# Patient Record
Sex: Female | Born: 1945
Health system: Southern US, Community
[De-identification: ages and names within clinical notes are randomized; demographics above are authoritative.]

## PROBLEM LIST (undated history)

## (undated) DIAGNOSIS — I5032 Chronic diastolic (congestive) heart failure: Secondary | ICD-10-CM

## (undated) DIAGNOSIS — I10 Essential (primary) hypertension: Secondary | ICD-10-CM

## (undated) DIAGNOSIS — F419 Anxiety disorder, unspecified: Secondary | ICD-10-CM

## (undated) DIAGNOSIS — G959 Disease of spinal cord, unspecified: Secondary | ICD-10-CM

## (undated) DIAGNOSIS — I350 Nonrheumatic aortic (valve) stenosis: Secondary | ICD-10-CM

## (undated) DIAGNOSIS — H409 Unspecified glaucoma: Secondary | ICD-10-CM

## (undated) DIAGNOSIS — N183 Chronic kidney disease, stage 3 unspecified: Secondary | ICD-10-CM

## (undated) DIAGNOSIS — Z952 Presence of prosthetic heart valve: Secondary | ICD-10-CM

## (undated) DIAGNOSIS — I00 Rheumatic fever without heart involvement: Secondary | ICD-10-CM

## (undated) DIAGNOSIS — E785 Hyperlipidemia, unspecified: Secondary | ICD-10-CM

## (undated) DIAGNOSIS — E119 Type 2 diabetes mellitus without complications: Secondary | ICD-10-CM

## (undated) HISTORY — PX: APPENDECTOMY: SHX54

## (undated) HISTORY — PX: TONSILLECTOMY: SUR1361

## (undated) HISTORY — PX: TUBAL LIGATION: SHX77

## (undated) HISTORY — DX: Chronic kidney disease, stage 3 unspecified: N18.30

## (undated) HISTORY — DX: Unspecified glaucoma: H40.9

## (undated) HISTORY — DX: Disease of spinal cord, unspecified: G95.9

## (undated) HISTORY — DX: Hyperlipidemia, unspecified: E78.5

## (undated) HISTORY — PX: VAGINAL HYSTERECTOMY: SUR661

## (undated) HISTORY — DX: Chronic kidney disease, stage 3 (moderate): N18.3

## (undated) HISTORY — DX: Essential (primary) hypertension: I10

## (undated) HISTORY — DX: Chronic diastolic (congestive) heart failure: I50.32

## (undated) HISTORY — DX: Type 2 diabetes mellitus without complications: E11.9

## (undated) HISTORY — PX: CARDIAC CATHETERIZATION: SHX172

## (undated) HISTORY — PX: NECK SURGERY: SHX720

## (undated) HISTORY — PX: EYE SURGERY: SHX253

## (undated) HISTORY — DX: Rheumatic fever without heart involvement: I00

---

## 1999-04-25 ENCOUNTER — Ambulatory Visit (HOSPITAL_BASED_OUTPATIENT_CLINIC_OR_DEPARTMENT_OTHER): Admission: RE | Admit: 1999-04-25 | Discharge: 1999-04-25 | Payer: Self-pay | Admitting: General Surgery

## 2001-11-11 ENCOUNTER — Emergency Department (HOSPITAL_COMMUNITY): Admission: EM | Admit: 2001-11-11 | Discharge: 2001-11-11 | Payer: Self-pay | Admitting: Emergency Medicine

## 2003-02-16 ENCOUNTER — Encounter: Admission: RE | Admit: 2003-02-16 | Discharge: 2003-05-17 | Payer: Self-pay | Admitting: *Deleted

## 2003-05-19 ENCOUNTER — Encounter: Admission: RE | Admit: 2003-05-19 | Discharge: 2003-08-17 | Payer: Self-pay | Admitting: *Deleted

## 2003-08-17 ENCOUNTER — Encounter: Admission: RE | Admit: 2003-08-17 | Discharge: 2003-11-15 | Payer: Self-pay | Admitting: *Deleted

## 2004-06-20 ENCOUNTER — Encounter: Admission: RE | Admit: 2004-06-20 | Discharge: 2004-06-20 | Payer: Self-pay | Admitting: *Deleted

## 2005-08-31 ENCOUNTER — Encounter: Admission: RE | Admit: 2005-08-31 | Discharge: 2005-08-31 | Payer: Self-pay | Admitting: *Deleted

## 2006-05-27 ENCOUNTER — Encounter: Admission: RE | Admit: 2006-05-27 | Discharge: 2006-08-25 | Payer: Self-pay | Admitting: *Deleted

## 2006-09-05 ENCOUNTER — Encounter: Admission: RE | Admit: 2006-09-05 | Discharge: 2006-09-05 | Payer: Self-pay | Admitting: *Deleted

## 2009-10-25 ENCOUNTER — Emergency Department (HOSPITAL_COMMUNITY): Admission: EM | Admit: 2009-10-25 | Discharge: 2009-10-25 | Payer: Self-pay | Admitting: Family Medicine

## 2010-09-20 ENCOUNTER — Encounter
Admission: RE | Admit: 2010-09-20 | Discharge: 2010-10-05 | Payer: Self-pay | Source: Home / Self Care | Attending: Neurosurgery | Admitting: Neurosurgery

## 2010-10-12 ENCOUNTER — Encounter
Admission: RE | Admit: 2010-10-12 | Discharge: 2010-11-06 | Payer: Self-pay | Source: Home / Self Care | Attending: Specialist | Admitting: Specialist

## 2010-11-27 ENCOUNTER — Ambulatory Visit: Payer: Self-pay | Attending: Neurosurgery | Admitting: Physical Therapy

## 2010-11-27 DIAGNOSIS — M25519 Pain in unspecified shoulder: Secondary | ICD-10-CM | POA: Insufficient documentation

## 2010-11-27 DIAGNOSIS — M542 Cervicalgia: Secondary | ICD-10-CM | POA: Insufficient documentation

## 2010-11-27 DIAGNOSIS — IMO0001 Reserved for inherently not codable concepts without codable children: Secondary | ICD-10-CM | POA: Insufficient documentation

## 2010-11-27 DIAGNOSIS — M2569 Stiffness of other specified joint, not elsewhere classified: Secondary | ICD-10-CM | POA: Insufficient documentation

## 2010-11-30 ENCOUNTER — Ambulatory Visit: Payer: Self-pay | Admitting: Rehabilitation

## 2010-12-05 ENCOUNTER — Ambulatory Visit: Payer: Self-pay | Admitting: Physical Therapy

## 2010-12-07 ENCOUNTER — Ambulatory Visit: Payer: Self-pay | Attending: Neurosurgery | Admitting: Physical Therapy

## 2010-12-07 DIAGNOSIS — IMO0001 Reserved for inherently not codable concepts without codable children: Secondary | ICD-10-CM | POA: Insufficient documentation

## 2010-12-07 DIAGNOSIS — M542 Cervicalgia: Secondary | ICD-10-CM | POA: Insufficient documentation

## 2010-12-07 DIAGNOSIS — M2569 Stiffness of other specified joint, not elsewhere classified: Secondary | ICD-10-CM | POA: Insufficient documentation

## 2010-12-07 DIAGNOSIS — M25519 Pain in unspecified shoulder: Secondary | ICD-10-CM | POA: Insufficient documentation

## 2010-12-12 ENCOUNTER — Ambulatory Visit: Payer: Self-pay | Admitting: Physical Therapy

## 2010-12-14 ENCOUNTER — Ambulatory Visit: Payer: Self-pay | Admitting: Physical Therapy

## 2010-12-19 ENCOUNTER — Ambulatory Visit: Payer: Self-pay | Admitting: Physical Therapy

## 2010-12-21 ENCOUNTER — Ambulatory Visit: Payer: Self-pay | Admitting: Physical Therapy

## 2010-12-26 ENCOUNTER — Ambulatory Visit: Payer: Self-pay | Admitting: Physical Therapy

## 2010-12-28 ENCOUNTER — Ambulatory Visit: Payer: Self-pay | Admitting: Physical Therapy

## 2011-01-02 ENCOUNTER — Ambulatory Visit: Payer: Self-pay | Admitting: Physical Therapy

## 2011-01-04 ENCOUNTER — Ambulatory Visit: Payer: Self-pay | Admitting: Physical Therapy

## 2011-01-09 ENCOUNTER — Ambulatory Visit: Payer: Self-pay | Attending: Neurosurgery | Admitting: Physical Therapy

## 2011-01-09 DIAGNOSIS — M2569 Stiffness of other specified joint, not elsewhere classified: Secondary | ICD-10-CM | POA: Insufficient documentation

## 2011-01-09 DIAGNOSIS — IMO0001 Reserved for inherently not codable concepts without codable children: Secondary | ICD-10-CM | POA: Insufficient documentation

## 2011-01-09 DIAGNOSIS — M25519 Pain in unspecified shoulder: Secondary | ICD-10-CM | POA: Insufficient documentation

## 2011-01-09 DIAGNOSIS — M542 Cervicalgia: Secondary | ICD-10-CM | POA: Insufficient documentation

## 2011-01-11 ENCOUNTER — Ambulatory Visit: Payer: Self-pay | Admitting: Physical Therapy

## 2011-01-16 ENCOUNTER — Ambulatory Visit: Payer: Self-pay | Admitting: Physical Therapy

## 2011-01-18 ENCOUNTER — Ambulatory Visit: Payer: Self-pay | Admitting: Physical Therapy

## 2011-01-23 ENCOUNTER — Ambulatory Visit: Payer: Self-pay | Admitting: Physical Therapy

## 2011-01-25 ENCOUNTER — Ambulatory Visit: Payer: Self-pay | Admitting: Physical Therapy

## 2011-01-30 ENCOUNTER — Ambulatory Visit: Payer: Self-pay | Admitting: Physical Therapy

## 2011-02-01 ENCOUNTER — Ambulatory Visit: Payer: Self-pay | Admitting: Physical Therapy

## 2011-02-06 ENCOUNTER — Ambulatory Visit: Payer: Self-pay | Attending: Neurosurgery | Admitting: Physical Therapy

## 2011-02-06 DIAGNOSIS — IMO0001 Reserved for inherently not codable concepts without codable children: Secondary | ICD-10-CM | POA: Insufficient documentation

## 2011-02-06 DIAGNOSIS — M542 Cervicalgia: Secondary | ICD-10-CM | POA: Insufficient documentation

## 2011-02-06 DIAGNOSIS — M2569 Stiffness of other specified joint, not elsewhere classified: Secondary | ICD-10-CM | POA: Insufficient documentation

## 2011-02-06 DIAGNOSIS — M25519 Pain in unspecified shoulder: Secondary | ICD-10-CM | POA: Insufficient documentation

## 2011-02-08 ENCOUNTER — Ambulatory Visit: Payer: Self-pay | Admitting: Physical Therapy

## 2011-02-13 ENCOUNTER — Ambulatory Visit: Payer: Self-pay | Admitting: Physical Therapy

## 2011-02-20 ENCOUNTER — Encounter: Payer: Self-pay | Admitting: Physical Therapy

## 2011-02-20 ENCOUNTER — Ambulatory Visit: Payer: Self-pay | Admitting: Physical Therapy

## 2011-02-22 ENCOUNTER — Encounter: Payer: Self-pay | Admitting: Physical Therapy

## 2012-08-06 ENCOUNTER — Other Ambulatory Visit: Payer: Self-pay | Admitting: Family Medicine

## 2012-08-06 DIAGNOSIS — Z1231 Encounter for screening mammogram for malignant neoplasm of breast: Secondary | ICD-10-CM

## 2012-10-16 DIAGNOSIS — Z981 Arthrodesis status: Secondary | ICD-10-CM | POA: Insufficient documentation

## 2012-10-17 ENCOUNTER — Ambulatory Visit
Admission: RE | Admit: 2012-10-17 | Discharge: 2012-10-17 | Disposition: A | Discharge status: Home / Self Care | Payer: Medicare Other | Source: Ambulatory Visit | Attending: Family Medicine | Admitting: Family Medicine

## 2012-10-17 DIAGNOSIS — Z1231 Encounter for screening mammogram for malignant neoplasm of breast: Secondary | ICD-10-CM

## 2014-03-05 LAB — BASIC METABOLIC PANEL WITH GFR: Creatinine: 1.5 mg/dL — AB (ref <=1.1)

## 2014-03-05 LAB — LIPID PANEL: LDL Cholesterol: 69 mg/dL

## 2014-03-05 LAB — HEMOGLOBIN A1C: Hgb A1c MFr Bld: 8 % — AB (ref 4.0–6.0)

## 2014-04-30 ENCOUNTER — Encounter: Payer: Self-pay | Admitting: *Deleted

## 2014-05-27 ENCOUNTER — Ambulatory Visit (INDEPENDENT_AMBULATORY_CARE_PROVIDER_SITE_OTHER): Payer: Commercial Managed Care - HMO | Admitting: Endocrinology

## 2014-05-27 ENCOUNTER — Encounter: Payer: Self-pay | Admitting: Endocrinology

## 2014-05-27 VITALS — BP 110/70 | HR 70 | Temp 98.3°F | Resp 16 | Ht 60.0 in | Wt 208.4 lb

## 2014-05-27 DIAGNOSIS — E1149 Type 2 diabetes mellitus with other diabetic neurological complication: Secondary | ICD-10-CM

## 2014-05-27 DIAGNOSIS — I10 Essential (primary) hypertension: Secondary | ICD-10-CM

## 2014-05-27 DIAGNOSIS — N183 Chronic kidney disease, stage 3 unspecified: Secondary | ICD-10-CM

## 2014-05-27 DIAGNOSIS — E785 Hyperlipidemia, unspecified: Secondary | ICD-10-CM

## 2014-05-27 MED ORDER — METFORMIN HCL 500 MG PO TABS: 500.0000 mg | ORAL_TABLET | Freq: Every day | ORAL | Status: DC

## 2014-05-27 NOTE — Patient Instructions (Signed)
Metformin at bedtime  Please check blood sugars at least half the time about 2 hours after any meal and times 3-4  per week on waking up. Please bring blood sugar monitor to each visit  Exercise bike daily

## 2014-05-27 NOTE — Progress Notes (Signed)
Patient ID: Shannon Kidd, female   DOB: 17-Feb-1946, 68 y.o.   MRN: [id number]           Reason for Appointment: Consultation for Type 2 Diabetes  Referring physician: Leighton Ruff  History of Present Illness:          Diagnosis: Type 2 diabetes mellitus, date of diagnosis: 2000        Past history: She used Metformin for her diabetes for several years until early 2015 and was taking 1g bid before it was stopped. She thinks her blood sugars are well controlled with this. Also at some point she was given Actos but this was stopped because of edema. Metformin was stopped in early 2015 on the suggestion of a nephrologist because of her renal dysfunction  Recent history:  Since early 2015 she has been taking Januvia 50 mg daily However her blood sugars have not been well controlled with A1c going up to 8% in 5/15 She has been checking her sugar fairly consistently recently with a generic monitor and they seem to be variable. Sugars are mostly higher in the mornings and recently about 170, usually better later in the day       Oral hypoglycemic drugs the patient is taking are: Januvia '50mg'$       Side effects from medications have been: None Compliance with the medical regimen: Good  Hypoglycemia: None    Glucose monitoring:  done one-2 times a day         Glucometer: True Result      Blood Glucose readings by time of day and averages from meter record  PREMEAL Breakfast Lunch Dinner Bedtime  Overall   Glucose range: 141--214 118-224 133-156 144-174   Median:     ?    Glycemic control: 7.2 in 10/14   Lab Results  Component Value Date   HGBA1C 8.0* 03/05/2014   Lab Results  Component Value Date   LDLCALC 69 03/05/2014   CREATININE 1.5* 03/05/2014    Retinal exam: Most recent:.   Self-care: The diet that the patient has been following is: tries to limit .      Meals: 3 meals per day. Is eating out daily, Breakfast is bagel or biscuits           Exercise: Bike 15 min, 3/7          Dietician visit: Most recent:2000.               Weight history: 195-245  Wt Readings from Last 3 Encounters:  05/27/14 208 lb 6.4 oz (94.53 kg)       Medication List       This list is accurate as of: 05/27/14  4:13 PM.  Always use your most recent med list.               aspirin 81 MG tablet  Take 81 mg by mouth daily.     lisinopril-hydrochlorothiazide 20-12.5 MG per tablet  Commonly known as:  PRINZIDE,ZESTORETIC  Take 0.5 tablets by mouth daily.     lovastatin 20 MG tablet  Commonly known as:  MEVACOR  Take 20 mg by mouth daily.     MULTIVITAMIN PO  Take 1 tablet by mouth daily.     pramipexole 0.25 MG tablet  Commonly known as:  MIRAPEX  Take 0.25 mg by mouth daily.     sitaGLIPtin 50 MG tablet  Commonly known as:  JANUVIA  Take 50 mg by mouth daily.  vitamin C 1000 MG tablet  Take 1,000 mg by mouth daily.     Vitamin D 2000 UNITS tablet  Take 2,000 Units by mouth daily.        Allergies:  Allergies  Allergen Reactions  . Actos [Pioglitazone]     Swelling   . Metformin And Related     Kidney issues     Past Medical History  Diagnosis Date  . HTN (hypertension)   . Diabetes mellitus   . Hyperlipemia   . Glaucoma   . Anemia   . Leg weakness   . Cervical myelopathy     s/p cervical fusion @ [facility]  . CKD (chronic kidney disease), stage III   . Decreased pedal pulses     normal ABI 02/03/13    No past surgical history on file.  No family history on file.  Social History:  reports that she has never smoked. She has never used smokeless tobacco. Her alcohol and drug histories are not on file.    Review of Systems       Lipids: She has been treated with lovastatin low-dose       Lab Results  Component Value Date   LDLCALC 69 03/05/2014                  Skin: No rash or infections     Thyroid:  No  unusual fatigue.     The blood pressure has been well controlled with lisinopril HCT, taking half tablet     No  swelling of feet.     No shortness of breath or chest tightness  on exertion.     Bowel habits: Normal.       No frequency of urination or dysuria      No joint  pains.          No history of Numbness, has tingling mostly in left along with some restless legs at night; no burning in feet      LABS:  Office Visit on 05/27/2014  Component Date Value Ref Range Status  . Creatinine 03/05/2014 1.5* .5 - 1.1 mg/dL Final  . LDL Cholesterol 03/05/2014 69   Final  . Hemoglobin A1C 03/05/2014 8.0* 4.0 - 6.0 % Final    Physical Examination:  BP 110/70  Pulse 70  Temp(Src) 98.3 F (36.8 C)  Resp 16  Ht 5' (1.524 m)  Wt 208 lb 6.4 oz (94.53 kg)  BMI 40.70 kg/m2  SpO2 96%  GENERAL:         Patient has marked generalized obesity.   HEENT:         Eye exam shows normal external appearance. Fundus exam shows no retinopathy.  Oral exam shows normal mucosa .  NECK:         General:  Neck exam shows no lymphadenopathy. Carotid exam shows no Thyroid is not enlarged and no nodules felt.   LUNGS:         Chest is symmetrical. Lungs are clear to auscultation.Marland Kitchen   HEART:         Heart sounds:  S1 and S2 are normal. No murmurs or clicks heard., no S3 or S4.   ABDOMEN:   There is no distention present. Liver and spleen are not palpable. No other mass or tenderness present.  EXTREMITIES:     There is no edema. No skin lesions present.Marland Kitchen  NEUROLOGICAL:   Vibration sense is  mildly  reduced in toes. Ankle jerks are  absent bilaterally.          Diabetic foot exam:  Diabetic foot exam shows normal monofilament sensation in the toes and plantar surfaces, no skin lesions or ulcers on the feet and normal pedal pulses MUSCULOSKELETAL:       There is no enlargement or deformity of the joints. Spine is normal to inspection.Marland Kitchen   SKIN:       No rash or lesions        ASSESSMENT:  Diabetes type 2, uncontrolled     She is currently taking Januvia alone without adequate control Her highest blood sugars appear  to be fasting, likely to be related to hepatic gluconeogenesis She apparently was doing much better with metformin but unable to take the previously high dose of 2000 mg because of renal dysfunction. Review of her meal planning indicates that she needs significant improvement with cutting back on high fat foods, fast foods and portions. Also need significant amount of diabetes education She is currently using a glucose monitor that is generic and not clear if it is reliable Discussed with patient that since metformin is an appropriate medication for her she can be given a small dose since her creatinine is only 1.48. Recent studies have shown that metformin is safe with creatinine clearance as low as 35  Complications: none   Hypertension: Well controlled  Hypercholesterolemia: LDL at target with Mevacor 20 mg  PLAN:    Start metformin 500 mg at bedtime, may consider using 1000 mg.  Start using Accu-Chek monitor  increase exercise  Consultation with dietitian  Advised her to reduce fast food and high fat intake  May also consider using Victoza if blood sugars are not controlled  Patient Instructions  Metformin at bedtime  Please check blood sugars at least half the time about 2 hours after any meal and times 3-4  per week on waking up. Please bring blood sugar monitor to each visit  Exercise bike daily    [facility] 05/27/2014, 4:13 PM   Note: This office note was prepared with Dragon voice recognition system technology. Any transcriptional errors that result from this process are unintentional.

## 2014-05-28 DIAGNOSIS — E1149 Type 2 diabetes mellitus with other diabetic neurological complication: Secondary | ICD-10-CM | POA: Insufficient documentation

## 2014-05-28 DIAGNOSIS — I10 Essential (primary) hypertension: Secondary | ICD-10-CM | POA: Insufficient documentation

## 2014-05-28 DIAGNOSIS — N183 Chronic kidney disease, stage 3 unspecified: Secondary | ICD-10-CM | POA: Insufficient documentation

## 2014-05-28 DIAGNOSIS — E785 Hyperlipidemia, unspecified: Secondary | ICD-10-CM | POA: Insufficient documentation

## 2014-05-31 ENCOUNTER — Encounter: Payer: Medicare HMO | Attending: Endocrinology | Admitting: *Deleted

## 2014-05-31 ENCOUNTER — Encounter: Payer: Self-pay | Admitting: *Deleted

## 2014-05-31 VITALS — Ht 60.0 in | Wt 208.2 lb

## 2014-05-31 DIAGNOSIS — E119 Type 2 diabetes mellitus without complications: Secondary | ICD-10-CM | POA: Diagnosis not present

## 2014-05-31 DIAGNOSIS — Z713 Dietary counseling and surveillance: Secondary | ICD-10-CM | POA: Diagnosis present

## 2014-05-31 DIAGNOSIS — E1149 Type 2 diabetes mellitus with other diabetic neurological complication: Secondary | ICD-10-CM

## 2014-05-31 NOTE — Progress Notes (Signed)
  Medical Nutrition Therapy:  Appt start time: 0930 end time:  1030.   Assessment:  Patient here today for diabetes education. She has had diabetes since 2000, and went to classes at that time. She is here for a refresher education. She can state foods with carbs and knows to limit carbs to 45 grams per meal. She eats out frequently, and is likely going to continue doing so. So, we discussed healthy options eating out. She is also open to preparing foods more at home. She has been on Januvia, which was not adequately controlling BG with A1c of 8.0% and fasting glucose readings over 200. AM BG is generally higher than postprandial. She was restarted on metformin at bedtime, but has only been taking for a few days.   MEDICATIONS: Januvia, Metformin   DIETARY INTAKE:   Usual eating pattern includes 3 meals and 1-2 snacks per day.  24-hr recall:  B ( AM): [employer] gravy or egg biscuit OR cinnamon raisin toast/bagel with butter, sugar-free jam, diet Pepsi Snk ( AM): None  L ( PM): Eats out: [employer] chicken nuggets, salad OR 1/2 sub sandwich (steak and cheese sub), fries diet Pepsi/water Snk ( PM): M&M's occasionally(small portion) D ( PM): Fried fish, OR 1/2 sub sandwich OR pizza OR chili beans, cornbread Snk ( PM): potato chips, peanuts, peanut butter cracker Beverages: Diet Pepsi, water  Usual physical activity: Stationary bike at home 1-2 times per week, 15 minutes, errands, housework  Estimated energy needs: 1200 calories 135 g carbohydrates 75 g protein 40 g fat  Progress Towards Goal(s):  In progress.   Nutritional Diagnosis:  NB-1.1 Food and nutrition-related knowledge deficit As related to diabetes.  As evidenced by last education in 2000.    Intervention:  Nutrition counseling. We discussed basic carb counting, including foods with carbs, label reading, portion size, and meal planning.   Goals:  1. 2-3 carb servings (30-45 grams) per meal, 1 serving (15 grams) per snack 2.  Choose healthy side dishes when eating out (salad, vegetables, baked potato).  3. Choose healthy, low carb snacks.  4. Increase intake of vegetables to at least 3 servings daily.  4. Increase exercise to at least 3 days a week for 15-20 minutes.  Handouts given during visit include:  1200 calorie, 5 day meal plan  Meal plan card  Monitoring/Evaluation:  Dietary intake, exercise, blood glucose, and body weight in 2 month(s).

## 2014-06-18 ENCOUNTER — Other Ambulatory Visit: Payer: Commercial Managed Care - HMO

## 2014-06-23 ENCOUNTER — Other Ambulatory Visit: Payer: Self-pay | Admitting: *Deleted

## 2014-06-23 ENCOUNTER — Telehealth: Payer: Self-pay | Admitting: Endocrinology

## 2014-06-23 MED ORDER — GLUCOSE BLOOD VI STRP: ORAL_STRIP | Status: DC

## 2014-06-23 NOTE — Telephone Encounter (Signed)
Patient would like her test strips for accu check called in   Pharmacy: [facility] [address]    Thank you

## 2014-06-23 NOTE — Telephone Encounter (Signed)
rx sent

## 2014-06-24 ENCOUNTER — Ambulatory Visit: Payer: Commercial Managed Care - HMO | Admitting: Endocrinology

## 2014-06-25 ENCOUNTER — Ambulatory Visit: Payer: Commercial Managed Care - HMO | Admitting: Endocrinology

## 2014-07-15 ENCOUNTER — Other Ambulatory Visit (INDEPENDENT_AMBULATORY_CARE_PROVIDER_SITE_OTHER): Payer: Commercial Managed Care - HMO

## 2014-07-15 DIAGNOSIS — IMO0002 Reserved for concepts with insufficient information to code with codable children: Secondary | ICD-10-CM

## 2014-07-15 DIAGNOSIS — E1165 Type 2 diabetes mellitus with hyperglycemia: Principal | ICD-10-CM

## 2014-07-15 DIAGNOSIS — E1149 Type 2 diabetes mellitus with other diabetic neurological complication: Secondary | ICD-10-CM

## 2014-07-15 DIAGNOSIS — N183 Chronic kidney disease, stage 3 unspecified: Secondary | ICD-10-CM

## 2014-07-15 LAB — RENAL FUNCTION PANEL
Albumin: 3.3 g/dL — ABNORMAL LOW (ref 3.5–5.2)
BUN: 27 mg/dL — ABNORMAL HIGH (ref 6–23)
CO2: 27 meq/L (ref 19–32)
Calcium: 9.3 mg/dL (ref 8.4–10.5)
Chloride: 101 meq/L (ref 96–112)
Creatinine, Ser: 1.6 mg/dL — ABNORMAL HIGH (ref 0.4–1.2)
GFR: 35.33 mL/min — ABNORMAL LOW
Glucose, Bld: 148 mg/dL — ABNORMAL HIGH (ref 70–99)
Phosphorus: 3.8 mg/dL (ref 2.3–4.6)
Potassium: 4.8 meq/L (ref 3.5–5.1)
Sodium: 135 meq/L (ref 135–145)

## 2014-07-15 LAB — HEMOGLOBIN A1C: Hgb A1c MFr Bld: 7.8 % — ABNORMAL HIGH (ref 4.6–6.5)

## 2014-07-20 ENCOUNTER — Ambulatory Visit (INDEPENDENT_AMBULATORY_CARE_PROVIDER_SITE_OTHER): Payer: Commercial Managed Care - HMO | Admitting: Endocrinology

## 2014-07-20 ENCOUNTER — Encounter: Payer: Self-pay | Admitting: Endocrinology

## 2014-07-20 VITALS — BP 118/64 | HR 71 | Temp 98.3°F | Resp 16 | Ht 60.0 in | Wt 206.8 lb

## 2014-07-20 DIAGNOSIS — N183 Chronic kidney disease, stage 3 unspecified: Secondary | ICD-10-CM

## 2014-07-20 DIAGNOSIS — IMO0002 Reserved for concepts with insufficient information to code with codable children: Secondary | ICD-10-CM

## 2014-07-20 DIAGNOSIS — E1165 Type 2 diabetes mellitus with hyperglycemia: Secondary | ICD-10-CM

## 2014-07-20 MED ORDER — GLIPIZIDE ER 5 MG PO TB24: 5.0000 mg | ORAL_TABLET | Freq: Every day | ORAL | Status: DC

## 2014-07-20 NOTE — Progress Notes (Signed)
Patient ID: Shannon Kidd, female   DOB: [birth year], 68 y.o.   MRN: [id number]           Reason for Appointment: Followup for Type 2 Diabetes  Referring physician: Juluis Rainier  History of Present Illness:          Diagnosis: Type 2 diabetes mellitus, date of diagnosis: 2000        Past history: She used Metformin for her diabetes for several years until early 2015 and was taking 1g bid before it was stopped. She thinks her blood sugars are well controlled with this. Also at some point she was given Actos but this was stopped because of edema. Metformin was stopped in early 2015 on the suggestion of a nephrologist because of her renal dysfunction  Recent history:  Since early 2015 she has been taking Januvia 50 mg daily However since her blood sugars were not well controlled with A1c of 8% in 5/15 she was given 500 mg metformin in addition when she was seen here in 05/2014 She appears to have fairly good blood sugars at home although averaging about 150 at various times of the day including fasting However is not checked any readings after dinner despite discussion on the last visit She thinks that she has not been irritable or diet because of husband's illness Also has not been able to find time to exercise Sugars are not as high in the mornings, previously about 170 She is not complaining about the cost of Januvia in the Medicare gap       Oral hypoglycemic drugs the patient is taking are: Januvia 50mg  with 500 mg metformin      Side effects from medications have been: None Compliance with the medical regimen: Good  Hypoglycemia: None    Glucose monitoring:  done <1 times a day         Glucometer:  Accu-Chek     Blood Glucose readings by time of day and averages from meter record  PREMEAL Breakfast Lunch Dinner Bedtime Overall  Glucose range:  143-163   134-156   141-167     Mean/median:  150     150   Glycemic control:  previous 7.2 in 10/14   Lab Results  Component  Value Date   HGBA1C 7.8* 07/15/2014   HGBA1C 8.0* 03/05/2014   Lab Results  Component Value Date   LDLCALC 69 03/05/2014   CREATININE 1.6* 07/15/2014    Self-care:      Meals: 3 meals per day. Is eating out daily, Breakfast is egg, toast       Exercise: less now, was on Bike 15 min, 3/7 days         Dietician visit: Most recent:2000.               Weight history: 195-245 Pounds previously  Wt Readings from Last 3 Encounters:  07/20/14 206 lb 12.8 oz (93.804 kg)  05/31/14 208 lb 3.2 oz (94.439 kg)  05/27/14 208 lb 6.4 oz (94.53 kg)       Medication List       This list is accurate as of: 07/20/14  9:18 PM.  Always use your most recent med list.               aspirin 81 MG tablet  Take 81 mg by mouth daily.     cyanocobalamin 100 MCG tablet  Take 100 mcg by mouth daily.     glipiZIDE 5 MG 24 hr tablet  Commonly known as:  GLUCOTROL XL  Take 1 tablet (5 mg total) by mouth daily with breakfast.     glucose blood test strip  Commonly known as:  ACCU-CHEK AVIVA PLUS  Use as instructed to check blood sugar daily dx code 250.62     lisinopril-hydrochlorothiazide 20-12.5 MG per tablet  Commonly known as:  PRINZIDE,ZESTORETIC  Take 0.5 tablets by mouth daily.     lovastatin 20 MG tablet  Commonly known as:  MEVACOR  Take 20 mg by mouth daily.     metFORMIN 500 MG tablet  Commonly known as:  GLUCOPHAGE  Take 1 tablet (500 mg total) by mouth at bedtime.     MULTIVITAMIN PO  Take 1 tablet by mouth daily.     pramipexole 0.25 MG tablet  Commonly known as:  MIRAPEX  Take 0.25 mg by mouth daily.     vitamin C 1000 MG tablet  Take 1,000 mg by mouth daily.     Vitamin D 2000 UNITS tablet  Take 2,000 Units by mouth daily.        Allergies:  Allergies  Allergen Reactions  . Actos [Pioglitazone]     Swelling     Past Medical History  Diagnosis Date  . HTN (hypertension)   . Diabetes mellitus   . Hyperlipemia   . Glaucoma   . Anemia   . Leg weakness     . Cervical myelopathy     s/p cervical fusion @ [facility]  . CKD (chronic kidney disease), stage III   . Decreased pedal pulses     normal ABI 02/03/13    No past surgical history on file.  No family history on file.  Social History:  reports that she has never smoked. She has never used smokeless tobacco. Her alcohol and drug histories are not on file.    Review of Systems       Lipids: She has been treated with lovastatin low-dose       Lab Results  Component Value Date   LDLCALC 69 03/05/2014                  The blood pressure has been well controlled with lisinopril HCT, taking half tablet         No history of Numbness, has tingling mostly in left along with some restless legs at night; no burning in feet    Chronic kidney disease of unclear etiology, previously creatinine 1.48  LABS:  Appointment on 07/15/2014  Component Date Value Ref Range Status  . Hemoglobin A1C 07/15/2014 7.8* 4.6 - 6.5 % Final   Glycemic Control Guidelines for People with Diabetes:Non Diabetic:  <6%Goal of Therapy: <7%Additional Action Suggested:  >8%   . Sodium 07/15/2014 135  135 - 145 mEq/L Final  . Potassium 07/15/2014 4.8  3.5 - 5.1 mEq/L Final  . Chloride 07/15/2014 101  96 - 112 mEq/L Final  . CO2 07/15/2014 27  19 - 32 mEq/L Final  . Calcium 07/15/2014 9.3  8.4 - 10.5 mg/dL Final  . Albumin 16/10/960410/05/2014 3.3* 3.5 - 5.2 g/dL Final  . BUN 54/09/811910/05/2014 27* 6 - 23 mg/dL Final  . Creatinine, Ser 07/15/2014 1.6* 0.4 - 1.2 mg/dL Final  . Glucose, Bld 14/78/295610/05/2014 148* 70 - 99 mg/dL Final  . Phosphorus 21/30/865710/05/2014 3.8  2.3 - 4.6 mg/dL Final  . GFR 84/69/629510/05/2014 35.33* >60.00 mL/min Final    Physical Examination:  BP 118/64  Pulse 71  Temp(Src) 98.3 F (36.8 C)  Resp 16  Ht 5' (1.524 m)  Wt 206 lb 12.8 oz (93.804 kg)  BMI 40.39 kg/m2  SpO2 97%  Repeat blood pressure standing 128/70  ASSESSMENT:  Diabetes type 2, uncontrolled     She is currently taking Januvia  low dose  metformin Although her A1c is still relatively high at 7.8% her recent blood sugars are not significantly high and fairly consistent from morning to afternoon She is complaining about the cost of Januvia since she is in the Medicare gap  Also not able to increase her metformin ER 500 mg because of borderline GFR of 35: However tolerating this well  She thinks she has not been able to be very compliant with diet and exercise because of family illness  PLAN:     Continue metformin 500 mg at bedtime   Trial  of glipizide ER 5 mg daily instead of Januvia  More blood sugars after dinner  Try to resume exercise and improved diet consistently  Followup with PCP and nephrologist for management of blood pressure, not clear she needs to be on ACE inhibitor especially with rising creatinine  Patient Instructions  Glipzide ER in pm and stop Januvia  More sugars after dinner    [facility] [place] 07/20/2014, 9:18 PM   Note: This office note was prepared with Insurance underwriterDragon voice recognition system technology. Any transcriptional errors that result from this process are unintentional.

## 2014-07-20 NOTE — Patient Instructions (Addendum)
Glipzide ER in pm and stop Januvia  More sugars after dinner

## 2014-08-02 ENCOUNTER — Ambulatory Visit: Payer: Commercial Managed Care - HMO | Admitting: *Deleted

## 2014-08-16 ENCOUNTER — Other Ambulatory Visit: Payer: Self-pay

## 2014-08-16 DIAGNOSIS — Z1231 Encounter for screening mammogram for malignant neoplasm of breast: Secondary | ICD-10-CM

## 2014-08-25 ENCOUNTER — Other Ambulatory Visit: Payer: Medicare HMO

## 2014-08-25 ENCOUNTER — Other Ambulatory Visit (INDEPENDENT_AMBULATORY_CARE_PROVIDER_SITE_OTHER): Payer: Commercial Managed Care - HMO

## 2014-08-25 DIAGNOSIS — N183 Chronic kidney disease, stage 3 unspecified: Secondary | ICD-10-CM

## 2014-08-25 DIAGNOSIS — E1165 Type 2 diabetes mellitus with hyperglycemia: Secondary | ICD-10-CM

## 2014-08-25 DIAGNOSIS — IMO0002 Reserved for concepts with insufficient information to code with codable children: Secondary | ICD-10-CM

## 2014-08-25 LAB — LIPID PANEL
Cholesterol: 145 mg/dL (ref 0–200)
HDL: 34 mg/dL — ABNORMAL LOW
LDL Cholesterol: 85 mg/dL (ref 0–99)
NonHDL: 111
Total CHOL/HDL Ratio: 4
Triglycerides: 129 mg/dL (ref 0.0–149.0)
VLDL: 25.8 mg/dL (ref 0.0–40.0)

## 2014-08-25 LAB — COMPREHENSIVE METABOLIC PANEL WITH GFR
ALT: 22 U/L (ref 0–35)
AST: 22 U/L (ref 0–37)
Albumin: 3.7 g/dL (ref 3.5–5.2)
Alkaline Phosphatase: 77 U/L (ref 39–117)
BUN: 27 mg/dL — ABNORMAL HIGH (ref 6–23)
CO2: 30 meq/L (ref 19–32)
Calcium: 9.2 mg/dL (ref 8.4–10.5)
Chloride: 103 meq/L (ref 96–112)
Creatinine, Ser: 1.5 mg/dL — ABNORMAL HIGH (ref 0.4–1.2)
GFR: 36.96 mL/min — ABNORMAL LOW
Glucose, Bld: 103 mg/dL — ABNORMAL HIGH (ref 70–99)
Potassium: 5.3 meq/L — ABNORMAL HIGH (ref 3.5–5.1)
Sodium: 140 meq/L (ref 135–145)
Total Bilirubin: 0.5 mg/dL (ref 0.2–1.2)
Total Protein: 7.4 g/dL (ref 6.0–8.3)

## 2014-08-25 LAB — MICROALBUMIN / CREATININE URINE RATIO
Creatinine,U: 24.8 mg/dL
Microalb Creat Ratio: 7.7 mg/g (ref 0.0–30.0)
Microalb, Ur: 1.9 mg/dL (ref 0.0–1.9)

## 2014-08-27 LAB — FRUCTOSAMINE: Fructosamine: 258 umol/L (ref 190–270)

## 2014-09-01 ENCOUNTER — Ambulatory Visit (INDEPENDENT_AMBULATORY_CARE_PROVIDER_SITE_OTHER): Payer: Commercial Managed Care - HMO | Admitting: Endocrinology

## 2014-09-01 ENCOUNTER — Encounter: Payer: Self-pay | Admitting: Endocrinology

## 2014-09-01 VITALS — BP 118/70 | HR 67 | Temp 98.1°F | Resp 14 | Ht 60.0 in | Wt 205.8 lb

## 2014-09-01 DIAGNOSIS — E119 Type 2 diabetes mellitus without complications: Secondary | ICD-10-CM

## 2014-09-01 DIAGNOSIS — E875 Hyperkalemia: Secondary | ICD-10-CM

## 2014-09-01 MED ORDER — AMLODIPINE BESYLATE 5 MG PO TABS: 5.0000 mg | ORAL_TABLET | Freq: Every day | ORAL | Status: DC

## 2014-09-01 NOTE — Progress Notes (Signed)
Patient ID: Shannon Kidd, female   DOB: 1946-05-15, 68 y.o.   MRN: [id number]           Reason for Appointment: Followup for Type 2 Diabetes  Referring physician: Juluis Rainier  History of Present Illness:          Diagnosis: Type 2 diabetes mellitus, date of diagnosis: 2000        Past history: She used Metformin for her diabetes for several years until early 2015 and was taking 1g bid before it was stopped. She thinks her blood sugars are well controlled with this. Also at some point she was given Actos but this was stopped because of edema. Metformin was stopped in early 2015 on the suggestion of a nephrologist because of her renal dysfunction  Recent history:  Since early 2015 she had been taking Januvia 50 mg daily However since her blood sugars were not well controlled with A1c of 8% in 5/15 she was given 500 mg metformin in addition when she was seen here in 05/2014.  Blood sugars were as high as 170 fasting She was having fairly good blood sugars at home although averaging about 150 at various times of the day including fasting Because of her concern about the cost of Januvia she was switched to glipizide ER 5 mg in 10/15 Her blood sugars at home appear to be somewhat better and averaging only 119 Tends to have occasional higher readings in the mornings but they have been as low as 100 No hypoglycemia with glipizide and lowest blood sugar is 90 Has a couple of readings at bedtime which look fairly good also She is now starting to exercise also       Oral hypoglycemic drugs the patient is taking are: Glipizide ER 500 mg with 500 mg metformin at bedtime      Side effects from medications have been: None Compliance with the medical regimen: Good  Hypoglycemia: None    Glucose monitoring:  done <1 times a day         Glucometer:  Accu-Chek     Blood Glucose readings by time of day and averages from meter record  PREMEAL Breakfast Lunch Dinner  8-11 PM  Overall  Glucose  range:  100-158   104   90, 105   114-137    Mean/median:     119   Glycemic control:  previous 7.2 in 10/14   Lab Results  Component Value Date   HGBA1C 7.8* 07/15/2014   HGBA1C 8.0* 03/05/2014   Lab Results  Component Value Date   MICROALBUR 1.9 08/25/2014   LDLCALC 85 08/25/2014   CREATININE 1.5* 08/25/2014    Self-care:      Meals: 3 meals per day. Is eating out daily, Breakfast is egg, toast       Exercise: on Bike 15 min, 3/7 days         Dietician visit: Most recent:2000.               Weight history: 195-245 Pounds previously  Wt Readings from Last 3 Encounters:  09/01/14 205 lb 12.8 oz (93.35 kg)  07/20/14 206 lb 12.8 oz (93.804 kg)  05/31/14 208 lb 3.2 oz (94.439 kg)       Medication List       This list is accurate as of: 09/01/14  1:08 PM.  Always use your most recent med list.               amoxicillin-clavulanate  875-125 MG per tablet  Commonly known as:  AUGMENTIN  Take 1 tablet by mouth 2 (two) times daily.     aspirin 81 MG tablet  Take 81 mg by mouth daily.     cyanocobalamin 100 MCG tablet  Take 100 mcg by mouth daily.     glipiZIDE 5 MG 24 hr tablet  Commonly known as:  GLUCOTROL XL  Take 1 tablet (5 mg total) by mouth daily with breakfast.     glucose blood test strip  Commonly known as:  ACCU-CHEK AVIVA PLUS  Use as instructed to check blood sugar daily dx code 250.62     lisinopril-hydrochlorothiazide 20-12.5 MG per tablet  Commonly known as:  PRINZIDE,ZESTORETIC  Take 0.5 tablets by mouth daily.     lovastatin 20 MG tablet  Commonly known as:  MEVACOR  Take 20 mg by mouth daily.     metFORMIN 500 MG tablet  Commonly known as:  GLUCOPHAGE  Take 1 tablet (500 mg total) by mouth at bedtime.     MULTIVITAMIN PO  Take 1 tablet by mouth daily.     pramipexole 0.25 MG tablet  Commonly known as:  MIRAPEX  Take 0.25 mg by mouth daily.     vitamin C 1000 MG tablet  Take 1,000 mg by mouth daily.     Vitamin D 2000 UNITS  tablet  Take 2,000 Units by mouth daily.        Allergies:  Allergies  Allergen Reactions  . Actos [Pioglitazone]     Swelling     Past Medical History  Diagnosis Date  . HTN (hypertension)   . Diabetes mellitus   . Hyperlipemia   . Glaucoma   . Anemia   . Leg weakness   . Cervical myelopathy     s/p cervical fusion @ [facility]  . CKD (chronic kidney disease), stage III   . Decreased pedal pulses     normal ABI 02/03/13    No past surgical history on file.  Family History  Problem Relation Age of Onset  . Diabetes Mother   . Cancer Mother     Colon  . Diabetes Sister   . Diabetes Paternal Grandmother     Social History:  reports that she has never smoked. She has never used smokeless tobacco. Her alcohol and drug histories are not on file.    Review of Systems       Lipids: She has been treated with lovastatin low-dose       Lab Results  Component Value Date   CHOL 145 08/25/2014   HDL 34.00* 08/25/2014   LDLCALC 85 08/25/2014   TRIG 129.0 08/25/2014   CHOLHDL 4 08/25/2014                  The blood pressure has been well controlled with lisinopril HCT, taking half tablet Potassium is high, she does not use salt substitutes         No history of Numbness, has tingling mostly in left along with some restless legs at night; no burning in feet    Chronic kidney disease of unclear etiology   Lab Results  Component Value Date   CREATININE 1.5* 08/25/2014     LABS:  No visits with results within 1 Week(s) from this visit. Latest known visit with results is:  Appointment on 08/25/2014  Component Date Value Ref Range Status  . Sodium 08/25/2014 140  135 - 145 mEq/L Final  . Potassium 08/25/2014  5.3* 3.5 - 5.1 mEq/L Final  . Chloride 08/25/2014 103  96 - 112 mEq/L Final  . CO2 08/25/2014 30  19 - 32 mEq/L Final  . Glucose, Bld 08/25/2014 103* 70 - 99 mg/dL Final  . BUN 56/21/308611/18/2015 27* 6 - 23 mg/dL Final  . Creatinine, Ser 08/25/2014 1.5* 0.4 -  1.2 mg/dL Final  . Total Bilirubin 08/25/2014 0.5  0.2 - 1.2 mg/dL Final  . Alkaline Phosphatase 08/25/2014 77  39 - 117 U/L Final  . AST 08/25/2014 22  0 - 37 U/L Final  . ALT 08/25/2014 22  0 - 35 U/L Final  . Total Protein 08/25/2014 7.4  6.0 - 8.3 g/dL Final  . Albumin 57/84/696211/18/2015 3.7  3.5 - 5.2 g/dL Final  . Calcium 95/28/413211/18/2015 9.2  8.4 - 10.5 mg/dL Final  . GFR 44/01/027211/18/2015 36.96* >60.00 mL/min Final  . Fructosamine 08/25/2014 258  190 - 270 umol/L Final  . Cholesterol 08/25/2014 145  0 - 200 mg/dL Final   ATP III Classification       Desirable:  < 200 mg/dL               Borderline High:  200 - 239 mg/dL          High:  > = 536240 mg/dL  . Triglycerides 08/25/2014 129.0  0.0 - 149.0 mg/dL Final   Normal:  <644<150 mg/dLBorderline High:  150 - 199 mg/dL  . HDL 08/25/2014 34.00* >39.00 mg/dL Final  . VLDL 03/47/425911/18/2015 25.8  0.0 - 40.0 mg/dL Final  . LDL Cholesterol 08/25/2014 85  0 - 99 mg/dL Final  . Total CHOL/HDL Ratio 08/25/2014 4   Final                  Men          Women1/2 Average Risk     3.4          3.3Average Risk          5.0          4.42X Average Risk          9.6          7.13X Average Risk          15.0          11.0                      . NonHDL 08/25/2014 111.00   Final   NOTE:  Non-HDL goal should be 30 mg/dL higher than patient's LDL goal (i.e. LDL goal of < 70 mg/dL, would have non-HDL goal of < 100 mg/dL)  . Microalb, Ur 08/25/2014 1.9  0.0 - 1.9 mg/dL Final  . Creatinine,U 56/38/756411/18/2015 24.8   Final  . Microalb Creat Ratio 08/25/2014 7.7  0.0 - 30.0 mg/g Final    Physical Examination:  BP 118/70 mmHg  Pulse 67  Temp(Src) 98.1 F (36.7 C)  Resp 14  Ht 5' (1.524 m)  Wt 205 lb 12.8 oz (93.35 kg)  BMI 40.19 kg/m2  SpO2 97%    ASSESSMENT:  Diabetes type 2, uncontrolled     She is currently taking glipizide ER 5 mg with  low dose metformin Her blood sugars are overall better with switching to glipizide from Januvia at home with the average blood sugar coming down  from 150 down to 119 Since she was just switched a month ago she is not due for an A1c at present Discussed  potential for hypoglycemia with glipizide and to have regular meals She is also doing a little better with her exercise regimen  HYPERTENSION: Although she is taking only half a tablet of Zestoretic she is now getting mild hyperkalemia with the ACE inhibitor  PLAN:     Continue metformin 500 mg at bedtime   Continue glipizide ER 5 mg daily instead of Januvia  More blood sugars 2 hours after dinner.  A1c on the next visit  Since PCP is not available this week and will change her blood pressure medication to Norvasc 5 mg daily and have her follow-up with PCP   There are no Patient Instructions on file for this visit.  Shannon Kidd 09/01/2014, 1:08 PM   Note: This office note was prepared with Dragon voice recognition system technology. Any transcriptional errors that result from this process are unintentional.

## 2014-09-06 ENCOUNTER — Ambulatory Visit
Admission: RE | Admit: 2014-09-06 | Discharge: 2014-09-06 | Disposition: A | Discharge status: Home / Self Care | Payer: Commercial Managed Care - HMO | Source: Ambulatory Visit

## 2014-09-06 DIAGNOSIS — Z1231 Encounter for screening mammogram for malignant neoplasm of breast: Secondary | ICD-10-CM

## 2014-09-09 ENCOUNTER — Encounter: Payer: Self-pay | Admitting: *Deleted

## 2014-09-09 ENCOUNTER — Encounter: Payer: Commercial Managed Care - HMO | Attending: Family Medicine | Admitting: *Deleted

## 2014-09-09 DIAGNOSIS — E119 Type 2 diabetes mellitus without complications: Secondary | ICD-10-CM | POA: Insufficient documentation

## 2014-09-09 DIAGNOSIS — Z713 Dietary counseling and surveillance: Secondary | ICD-10-CM | POA: Diagnosis not present

## 2014-09-09 NOTE — Patient Instructions (Addendum)
Plan:  Aim for 45 grams carbohydrate per meal Aim for 0-30 Carbs per snack if hungry  Include protein in moderation with your meals and snacks Consider making enough food for 2 or more meals when you cook so you can save in freezer to save time later Continue reading food labels for Total Carbohydrate of foods Continue with your activity level by riding stationary bike daily as tolerated Continue checking BG at alternate times per day as directed by MD  Continue taking medication as directed by MD

## 2014-09-09 NOTE — Progress Notes (Signed)
  Medical Nutrition Therapy:  Appt start time: 0930 end time:  1000.  Assessment:  Patient here today for diabetes education follow up visit. She states she is comfortable with carb counting and is happy with improvement in BG's Stated range of 90-120 mg/dl both pre and post meals. She rides her stationary bike 15 minutes almost every day as well as walking through stores when shopping. They eat out often so she is considering getting her crock pot out to plan ahead and eat less fast food.  MEDICATIONS:  Metformin and Glipizide   DIETARY INTAKE:   Usual eating pattern includes 3 meals and 1-2 snacks per day.  24-hr recall:  B ( AM): [employer] gravy or egg biscuit OR cinnamon raisin toast/bagel with butter, sugar-free jam, diet Pepsi Snk ( AM): None  L ( PM): Eats out: [employer] chicken nuggets, salad OR 1/2 sub sandwich (steak and cheese sub), fries diet Pepsi/water Snk ( PM): M&M's occasionally(small portion) D ( PM): Fried fish, OR 1/2 sub sandwich OR pizza OR chili beans, cornbread Snk ( PM): potato chips, peanuts, peanut butter cracker Beverages: Diet Pepsi, water  Usual physical activity: Stationary bike at home 3-4 times per week, 15 minutes,walking in stores when she shops  Estimated energy needs: 1200 calories 135 g carbohydrates 75 g protein 40 g fat  Progress Towards Goal(s):  In progress.   Nutritional Diagnosis:  NB-1.1 Food and nutrition-related knowledge deficit As related to diabetes.  As evidenced by last education in 2000.    Intervention:  Nutrition counseling. I commended her on her improved eating habits. Encouraged her to exercise daily with a set time (appointment) each day. Suggested cooking larger portions when she does cook at home so she will have left overs for another meal or two.   Plan:  Aim for 45 grams carbohydrate per meal Aim for 0-30 Carbs per snack if hungry  Include protein in moderation with your meals and snacks Consider making enough food for  2 or more meals when you cook so you can save in freezer to save time later Continue reading food labels for Total Carbohydrate of foods Continue with your activity level by riding stationary bike daily as tolerated Continue checking BG at alternate times per day as directed by MD  Continue taking medication as directed by MD  .  Handouts given during visit include:  No new handouts today  Monitoring/Evaluation:  Dietary intake, exercise, blood glucose, and body weight PRN.

## 2014-09-17 ENCOUNTER — Other Ambulatory Visit: Payer: Self-pay | Admitting: Endocrinology

## 2014-10-02 ENCOUNTER — Other Ambulatory Visit: Payer: Self-pay | Admitting: Endocrinology

## 2014-11-29 ENCOUNTER — Other Ambulatory Visit (INDEPENDENT_AMBULATORY_CARE_PROVIDER_SITE_OTHER): Payer: PPO

## 2014-11-29 ENCOUNTER — Other Ambulatory Visit: Payer: Medicare HMO

## 2014-11-29 DIAGNOSIS — E119 Type 2 diabetes mellitus without complications: Secondary | ICD-10-CM

## 2014-11-29 LAB — COMPREHENSIVE METABOLIC PANEL WITH GFR
ALT: 23 U/L (ref 0–35)
AST: 26 U/L (ref 0–37)
Albumin: 3.9 g/dL (ref 3.5–5.2)
Alkaline Phosphatase: 87 U/L (ref 39–117)
BUN: 20 mg/dL (ref 6–23)
CO2: 31 meq/L (ref 19–32)
Calcium: 9.8 mg/dL (ref 8.4–10.5)
Chloride: 101 meq/L (ref 96–112)
Creatinine, Ser: 1.27 mg/dL — ABNORMAL HIGH (ref 0.40–1.20)
GFR: 44.41 mL/min — ABNORMAL LOW
Glucose, Bld: 112 mg/dL — ABNORMAL HIGH (ref 70–99)
Potassium: 4.8 meq/L (ref 3.5–5.1)
Sodium: 138 meq/L (ref 135–145)
Total Bilirubin: 0.4 mg/dL (ref 0.2–1.2)
Total Protein: 7.7 g/dL (ref 6.0–8.3)

## 2014-11-29 LAB — HEMOGLOBIN A1C: Hgb A1c MFr Bld: 7.5 % — ABNORMAL HIGH (ref 4.6–6.5)

## 2014-12-02 ENCOUNTER — Ambulatory Visit (INDEPENDENT_AMBULATORY_CARE_PROVIDER_SITE_OTHER): Payer: PPO | Admitting: Endocrinology

## 2014-12-02 ENCOUNTER — Encounter: Payer: Self-pay | Admitting: Endocrinology

## 2014-12-02 VITALS — BP 136/80 | HR 74 | Temp 98.2°F | Resp 12 | Wt 209.8 lb

## 2014-12-02 DIAGNOSIS — N183 Chronic kidney disease, stage 3 unspecified: Secondary | ICD-10-CM

## 2014-12-02 DIAGNOSIS — E1165 Type 2 diabetes mellitus with hyperglycemia: Secondary | ICD-10-CM

## 2014-12-02 DIAGNOSIS — I10 Essential (primary) hypertension: Secondary | ICD-10-CM

## 2014-12-02 DIAGNOSIS — IMO0002 Reserved for concepts with insufficient information to code with codable children: Secondary | ICD-10-CM

## 2014-12-02 MED ORDER — METFORMIN HCL 1000 MG PO TABS: 1000.0000 mg | ORAL_TABLET | Freq: Every day | ORAL | Status: DC

## 2014-12-02 NOTE — Progress Notes (Signed)
Patient ID: Shannon Kidd, female   DOB: 1946/04/02, 69 y.o.   MRN: [id number]           Reason for Appointment: Followup for Type 2 Diabetes  Referring physician: Leighton Ruff  History of Present Illness:          Diagnosis: Type 2 diabetes mellitus, date of diagnosis: 2000        Past history: She used Metformin for her diabetes for several years until early 2015 and was taking 1g bid before it was stopped. She thinks her blood sugars are well controlled with this. Also at some point she was given Actos but this was stopped because of edema. Metformin was stopped in early 2015 on the suggestion of a nephrologist because of her renal dysfunction Since early 2015 she had been taking Januvia 50 mg daily  Recent history:  When her blood sugars were not well controlled with A1c of 8% in 02/2014 she was given 500 mg metformin in addition when she was seen here in 05/2014.  Blood sugars were as high as 170 fasting Because of her concern about the cost of Januvia she was switched to glipizide ER 5 mg in 10/15 Her blood sugars at home had improved with switching to glipizide ER and her A1c was down  Although her A1c is still slightly better at 7.5 she has had relatively higher readings at home recently  She thinks her blood sugars are higher because of noncompliance with diet. She is not paying attention to meal planning even though she knows what to do from her previous visit with dietitian She will sometimes eat sweets She has not exercise as regularly also. She has few blood sugars at home now and they appear to be mostly high fasting No side effects from taking 500 mg metformin after supper although previously with 2000 mg she did have diarrhea       Oral hypoglycemic drugs the patient is taking are: Glipizide ER 5 mg with 500 mg metformin at bedtime      Side effects from medications have been: None Compliance with the medical regimen: Good  Hypoglycemia: None    Glucose monitoring:   done <1 times a day         Glucometer:  Accu-Chek     Blood Glucose readings by time of day and averages from meter record  PRE-MEAL Breakfast Lunch Dinner Bedtime Overall  Glucose range:  144-184   169    111, 121    Mean/median:     151   Glycemic control:  previous 7.2 in 10/14   Lab Results  Component Value Date   HGBA1C 7.5* 11/29/2014   HGBA1C 7.8* 07/15/2014   HGBA1C 8.0* 03/05/2014   Lab Results  Component Value Date   MICROALBUR 1.9 08/25/2014   LDLCALC 85 08/25/2014   CREATININE 1.27* 11/29/2014    Self-care:      Meals: 3 meals per day. Is eating out daily, Breakfast is egg, toast. More sweets now.      Exercise: on Bike 15 min, 2/7 days         Dietician visit: Most recent: 8/15.               Weight history: 195-245 Pounds previously  Wt Readings from Last 3 Encounters:  12/02/14 209 lb 12.8 oz (95.165 kg)  09/01/14 205 lb 12.8 oz (93.35 kg)  07/20/14 206 lb 12.8 oz (93.804 kg)       Medication List  This list is accurate as of: 12/02/14  9:14 AM.  Always use your most recent med list.               amLODipine 5 MG tablet  Commonly known as:  NORVASC  Take 1 tablet (5 mg total) by mouth daily.     amoxicillin-clavulanate 875-125 MG per tablet  Commonly known as:  AUGMENTIN  Take 1 tablet by mouth 2 (two) times daily.     aspirin 81 MG tablet  Take 81 mg by mouth daily.     cyanocobalamin 100 MCG tablet  Take 100 mcg by mouth daily.     glipiZIDE 5 MG 24 hr tablet  Commonly known as:  GLUCOTROL XL  TAKE ONE TABLET BY MOUTH ONCE DAILY WITH BREAKFAST     glucose blood test strip  Commonly known as:  ACCU-CHEK AVIVA PLUS  Use as instructed to check blood sugar daily dx code 250.62     lovastatin 20 MG tablet  Commonly known as:  MEVACOR  Take 20 mg by mouth daily.     metFORMIN 1000 MG tablet  Commonly known as:  GLUCOPHAGE  Take 1 tablet (1,000 mg total) by mouth at bedtime.     MULTIVITAMIN PO  Take 1 tablet by mouth daily.      pramipexole 0.25 MG tablet  Commonly known as:  MIRAPEX  Take 0.25 mg by mouth daily.     vitamin C 1000 MG tablet  Take 1,000 mg by mouth daily.     Vitamin D 2000 UNITS tablet  Take 2,000 Units by mouth daily.        Allergies:  Allergies  Allergen Reactions  . Actos [Pioglitazone]     Swelling     Past Medical History  Diagnosis Date  . HTN (hypertension)   . Diabetes mellitus   . Hyperlipemia   . Glaucoma   . Anemia   . Leg weakness   . Cervical myelopathy     s/p cervical fusion @ [facility]  . CKD (chronic kidney disease), stage III   . Decreased pedal pulses     normal ABI 02/03/13    No past surgical history on file.  Family History  Problem Relation Age of Onset  . Diabetes Mother   . Cancer Mother     Colon  . Diabetes Sister   . Diabetes Paternal Grandmother     Social History:  reports that she has never smoked. She has never used smokeless tobacco. Her alcohol and drug histories are not on file.    Review of Systems       Lipids: She has been treated with lovastatin low-dose       Lab Results  Component Value Date   CHOL 145 08/25/2014   HDL 34.00* 08/25/2014   LDLCALC 85 08/25/2014   TRIG 129.0 08/25/2014   CHOLHDL 4 08/25/2014                  The blood pressure has been well controlled with Norvasc At [facility] 124/76 Potassium is not high, she does not use salt substitutes         No history of Numbness, has tingling mostly in left along with some restless legs at night; no burning in feet    Chronic kidney disease of unclear etiology   Lab Results  Component Value Date   CREATININE 1.27* 11/29/2014     LABS:  Appointment on 11/29/2014  Component Date Value Ref  Range Status  . Hgb A1c MFr Bld 11/29/2014 7.5* 4.6 - 6.5 % Final   Glycemic Control Guidelines for People with Diabetes:Non Diabetic:  <6%Goal of Therapy: <7%Additional Action Suggested:  >8%   . Sodium 11/29/2014 138  135 - 145 mEq/L Final  .  Potassium 11/29/2014 4.8  3.5 - 5.1 mEq/L Final  . Chloride 11/29/2014 101  96 - 112 mEq/L Final  . CO2 11/29/2014 31  19 - 32 mEq/L Final  . Glucose, Bld 11/29/2014 112* 70 - 99 mg/dL Final  . BUN 11/29/2014 20  6 - 23 mg/dL Final  . Creatinine, Ser 11/29/2014 1.27* 0.40 - 1.20 mg/dL Final  . Total Bilirubin 11/29/2014 0.4  0.2 - 1.2 mg/dL Final  . Alkaline Phosphatase 11/29/2014 87  39 - 117 U/L Final  . AST 11/29/2014 26  0 - 37 U/L Final  . ALT 11/29/2014 23  0 - 35 U/L Final  . Total Protein 11/29/2014 7.7  6.0 - 8.3 g/dL Final  . Albumin 11/29/2014 3.9  3.5 - 5.2 g/dL Final  . Calcium 11/29/2014 9.8  8.4 - 10.5 mg/dL Final  . GFR 11/29/2014 44.41* >60.00 mL/min Final    Physical Examination:  BP 136/80 mmHg  Pulse 74  Temp(Src) 98.2 F (36.8 C) (Oral)  Resp 12  Wt 209 lb 12.8 oz (95.165 kg)  SpO2 96%    ASSESSMENT:  Diabetes type 2, uncontrolled     She is currently taking glipizide ER 5 mg with  low dose metformin Her blood sugars are overall relatively higher at home especially fasting which she thinks is from not watching her diet and also inconsistent exercise Surprisingly her A1c is not any higher than before  HYPERTENSION: This is very well controlled now with Norvasc 5 mg daily Previously was getting hyperkalemia and her lisinopril HCTZ was stopped She actually has had an improvement in her creatinine level but this can potassium is stable  PLAN:     Continue metformin but increase the dose to 2 tablets of 500 mg at bedtime.  May consider extended release operation if she has any diarrhea   Continue glipizide ER 5 mg daily   More blood sugars to be checked regularly including 2 hours after dinner.  She will call if fasting readings are consistently high  Follow-up in 4 months  Resume diet as previously instructed  More regular exercise    Patient Instructions  Take Metformin '1000mg'$  after supper  Please check blood sugars at least half the time  about 2 hours after any meal and 3 times per week on waking up. Please bring blood sugar monitor to each visit. Recommended blood sugar levels about 2 hours after meal is 140-180 and on waking up 90-130  Exercise daily    Philmore Lepore 12/02/2014, 9:14 AM   Note: This office note was prepared with Estate agent. Any transcriptional errors that result from this process are unintentional.

## 2014-12-02 NOTE — Patient Instructions (Addendum)
Take Metformin 1000mg  after supper  Please check blood sugars at least half the time about 2 hours after any meal and 3 times per week on waking up. Please bring blood sugar monitor to each visit. Recommended blood sugar levels about 2 hours after meal is 140-180 and on waking up 90-130  Exercise daily

## 2015-01-10 ENCOUNTER — Other Ambulatory Visit: Payer: Self-pay | Admitting: Endocrinology

## 2015-02-25 ENCOUNTER — Other Ambulatory Visit: Payer: PPO

## 2015-02-25 ENCOUNTER — Other Ambulatory Visit (INDEPENDENT_AMBULATORY_CARE_PROVIDER_SITE_OTHER): Payer: PPO

## 2015-02-25 DIAGNOSIS — IMO0002 Reserved for concepts with insufficient information to code with codable children: Secondary | ICD-10-CM

## 2015-02-25 DIAGNOSIS — E1165 Type 2 diabetes mellitus with hyperglycemia: Secondary | ICD-10-CM

## 2015-02-25 LAB — COMPREHENSIVE METABOLIC PANEL WITH GFR
ALT: 25 U/L (ref 0–35)
AST: 27 U/L (ref 0–37)
Albumin: 4.1 g/dL (ref 3.5–5.2)
Alkaline Phosphatase: 83 U/L (ref 39–117)
BUN: 24 mg/dL — ABNORMAL HIGH (ref 6–23)
CO2: 31 meq/L (ref 19–32)
Calcium: 10.4 mg/dL (ref 8.4–10.5)
Chloride: 101 meq/L (ref 96–112)
Creatinine, Ser: 1.3 mg/dL — ABNORMAL HIGH (ref 0.40–1.20)
GFR: 43.2 mL/min — ABNORMAL LOW
Glucose, Bld: 89 mg/dL (ref 70–99)
Potassium: 4.8 meq/L (ref 3.5–5.1)
Sodium: 138 meq/L (ref 135–145)
Total Bilirubin: 0.4 mg/dL (ref 0.2–1.2)
Total Protein: 7.7 g/dL (ref 6.0–8.3)

## 2015-02-25 LAB — HEMOGLOBIN A1C: Hgb A1c MFr Bld: 7 % — ABNORMAL HIGH (ref 4.6–6.5)

## 2015-03-03 ENCOUNTER — Ambulatory Visit (INDEPENDENT_AMBULATORY_CARE_PROVIDER_SITE_OTHER): Payer: PPO | Admitting: Endocrinology

## 2015-03-03 ENCOUNTER — Other Ambulatory Visit: Payer: Self-pay | Admitting: *Deleted

## 2015-03-03 ENCOUNTER — Encounter: Payer: Self-pay | Admitting: Endocrinology

## 2015-03-03 VITALS — BP 132/76 | HR 76 | Temp 98.2°F | Resp 16 | Ht 60.0 in | Wt 210.6 lb

## 2015-03-03 DIAGNOSIS — IMO0002 Reserved for concepts with insufficient information to code with codable children: Secondary | ICD-10-CM

## 2015-03-03 DIAGNOSIS — E1165 Type 2 diabetes mellitus with hyperglycemia: Secondary | ICD-10-CM

## 2015-03-03 DIAGNOSIS — E1149 Type 2 diabetes mellitus with other diabetic neurological complication: Secondary | ICD-10-CM

## 2015-03-03 DIAGNOSIS — N183 Chronic kidney disease, stage 3 unspecified: Secondary | ICD-10-CM

## 2015-03-03 DIAGNOSIS — I10 Essential (primary) hypertension: Secondary | ICD-10-CM

## 2015-03-03 LAB — URINALYSIS, ROUTINE W REFLEX MICROSCOPIC
Bilirubin Urine: NEGATIVE
Hgb urine dipstick: NEGATIVE
Ketones, ur: NEGATIVE
Nitrite: NEGATIVE
RBC / HPF: NONE SEEN
Specific Gravity, Urine: <=1.005 — AB
Total Protein, Urine: NEGATIVE
Urine Glucose: NEGATIVE
Urobilinogen, UA: 0.2
pH: 5.5 (ref 5.0–8.0)

## 2015-03-03 LAB — MICROALBUMIN / CREATININE URINE RATIO
Creatinine,U: 35.6 mg/dL
Microalb Creat Ratio: 14.3 mg/g (ref 0.0–30.0)
Microalb, Ur: 5.1 mg/dL — ABNORMAL HIGH (ref 0.0–1.9)

## 2015-03-03 NOTE — Patient Instructions (Signed)
No change, stay active

## 2015-03-03 NOTE — Progress Notes (Signed)
Patient ID: Shannon Kidd, female   DOB: [birth year], 69 y.o.   MRN: [id number]           Reason for Appointment: Followup for Type 2 Diabetes  Referring physician: Leighton Ruff  History of Present Illness:          Diagnosis: Type 2 diabetes mellitus, date of diagnosis: 2000        Past history: She used Metformin for her diabetes for several years until early 2015 and was taking 1g bid before it was stopped. She thinks her blood sugars are well controlled with this. Also at some point she was given Actos but this was stopped because of edema. Metformin was stopped in early 2015 on the suggestion of a nephrologist because of her renal dysfunction Since early 2015 she had been taking Januvia 50 mg daily  Recent history:  When her blood sugars were not well controlled with A1c of 8% in 2015 she was started on low dose metformin She tends to have high fasting readings which were previously as high as 170 She also has been on glipizide ER instead of Januvia which she cannot afford  Since 11/2014 she has been taking 1000 mg of metformin in the evenings as her renal function has been stable With this her A1c has come down further and now at 7% Although her highest blood sugars are still in the morning they are improved as shown below Blood sugars are not high after meals although overall checking blood sugars somewhat infrequently  She will sometimes eat sweets because of stress otherwise trying to watch her diet Still not able to lose weight However she is trying to use her exercise bike which is indoors       Oral hypoglycemic drugs the patient is taking are: Glipizide ER 5 mg with 1000 mg metformin at bedtime      Side effects from medications have been: None Compliance with the medical regimen: Variable  Hypoglycemia: None    Glucose monitoring:  done <1 times a day         Glucometer:  Accu-Chek     Blood Glucose readings by time of day and averages from meter record  PRE-MEAL  Breakfast Lunch Dinner  7-10 PM  Overall  Glucose range:  130-154    102   87-124    Mean/median:  143      121    POST-MEAL PC Breakfast PC Lunch PC Dinner  Glucose range:  114-126     Mean/median:      Mean values apply above for all meters except median for One Touch   Lab Results  Component Value Date   HGBA1C 7.0* 02/25/2015   HGBA1C 7.5* 11/29/2014   HGBA1C 7.8* 07/15/2014   Lab Results  Component Value Date   MICROALBUR 1.9 08/25/2014   LDLCALC 85 08/25/2014   CREATININE 1.30* 02/25/2015    Self-care:      Meals: 3 meals per day. Is eating out daily, Breakfast is egg, toast. Some sweets and stress eating.      Exercise: on Bike 15 min, 4/7 days         Dietician visit: Most recent: 8/15.               Weight history: 195-245 Pounds previously  Wt Readings from Last 3 Encounters:  03/03/15 210 lb 9.6 oz (95.528 kg)  12/02/14 209 lb 12.8 oz (95.165 kg)  09/01/14 205 lb 12.8 oz (93.35 kg)  Medication List       This list is accurate as of: 03/03/15  9:54 AM.  Always use your most recent med list.               amLODipine 5 MG tablet  Commonly known as:  NORVASC  TAKE ONE TABLET BY MOUTH ONCE DAILY     aspirin 81 MG tablet  Take 81 mg by mouth daily.     glipiZIDE 5 MG 24 hr tablet  Commonly known as:  GLUCOTROL XL  TAKE ONE TABLET BY MOUTH ONCE DAILY WITH BREAKFAST     glucose blood test strip  Commonly known as:  ACCU-CHEK AVIVA PLUS  Use as instructed to check blood sugar daily dx code 250.62     lovastatin 20 MG tablet  Commonly known as:  MEVACOR  Take 20 mg by mouth daily.     metFORMIN 1000 MG tablet  Commonly known as:  GLUCOPHAGE  Take 1 tablet (1,000 mg total) by mouth at bedtime.     MULTIVITAMIN PO  Take 1 tablet by mouth daily.     pramipexole 0.25 MG tablet  Commonly known as:  MIRAPEX  Take 0.25 mg by mouth daily.     vitamin C 1000 MG tablet  Take 1,000 mg by mouth daily.     Vitamin D 2000 UNITS tablet  Take  2,000 Units by mouth daily.        Allergies:  Allergies  Allergen Reactions  . Actos [Pioglitazone]     Swelling     Past Medical History  Diagnosis Date  . HTN (hypertension)   . Diabetes mellitus   . Hyperlipemia   . Glaucoma   . Anemia   . Leg weakness   . Cervical myelopathy     s/p cervical fusion @ [facility]  . CKD (chronic kidney disease), stage III   . Decreased pedal pulses     normal ABI 02/03/13    No past surgical history on file.  Family History  Problem Relation Age of Onset  . Diabetes Mother   . Cancer Mother     Colon  . Diabetes Sister   . Diabetes Paternal Grandmother     Social History:  reports that she has never smoked. She has never used smokeless tobacco. Her alcohol and drug histories are not on file.    Review of Systems       Lipids: She has been treated with lovastatin low-dose with adequate control of LDL       Lab Results  Component Value Date   CHOL 145 08/25/2014   HDL 34.00* 08/25/2014   LDLCALC 85 08/25/2014   TRIG 129.0 08/25/2014   CHOLHDL 4 08/25/2014                  The blood pressure has been well controlled with Norvasc alone Previously was getting hyperkalemia and her lisinopril HCTZ was stopped She actually has had an improvement in her creatinine level but this can potassium is stable         No history of Numbness, has tingling mostly in left along with some restless legs at night; no burning in feet    Chronic kidney disease of unclear etiology, followed by nephrologist annually  Lab Results  Component Value Date   CREATININE 1.30* 02/25/2015     LABS:  Lab on 02/25/2015  Component Date Value Ref Range Status  . Hgb A1c MFr Bld 02/25/2015 7.0* 4.6 - 6.5 %  Final   Glycemic Control Guidelines for People with Diabetes:Non Diabetic:  <6%Goal of Therapy: <7%Additional Action Suggested:  >8%   . Sodium 02/25/2015 138  135 - 145 mEq/L Final  . Potassium 02/25/2015 4.8  3.5 - 5.1 mEq/L Final  .  Chloride 02/25/2015 101  96 - 112 mEq/L Final  . CO2 02/25/2015 31  19 - 32 mEq/L Final  . Glucose, Bld 02/25/2015 89  70 - 99 mg/dL Final  . BUN 02/25/2015 24* 6 - 23 mg/dL Final  . Creatinine, Ser 02/25/2015 1.30* 0.40 - 1.20 mg/dL Final  . Total Bilirubin 02/25/2015 0.4  0.2 - 1.2 mg/dL Final  . Alkaline Phosphatase 02/25/2015 83  39 - 117 U/L Final  . AST 02/25/2015 27  0 - 37 U/L Final  . ALT 02/25/2015 25  0 - 35 U/L Final  . Total Protein 02/25/2015 7.7  6.0 - 8.3 g/dL Final  . Albumin 02/25/2015 4.1  3.5 - 5.2 g/dL Final  . Calcium 02/25/2015 10.4  8.4 - 10.5 mg/dL Final  . GFR 02/25/2015 43.20* >60.00 mL/min Final    Physical Examination:  BP 132/76 mmHg  Pulse 76  Temp(Src) 98.2 F (36.8 C)  Resp 16  Ht 5' (1.524 m)  Wt 210 lb 9.6 oz (95.528 kg)  BMI 41.13 kg/m2  SpO2 96%    ASSESSMENT:  Diabetes type 2 with BMI over 40     She is currently taking glipizide ER 5 mg with  low dose metformin, 1000 mg at night Her blood sugars are overall relatively higher fasting but appeared to be excellent the later part of the day including after meals when she checks them at night She still has difficulty losing weight and she thinks this is a long-standing problem for her She does try to exercise more and trying to watch portions even when she is stress eating  Her A1c is now at 7% which is adequate  HYPERTENSION: This is very well controlled now with Norvasc 5 mg daily  PLAN:     Continue metformin 1000 mg at night since renal function is stable.  She is tolerating this well  A1c to be checked in 4 months   Continue glipizide ER 5 mg daily   More efforts to watch her diet, she does not want to see the dietitian at this time  More regular exercise    Patient Instructions  No change, stay active    [facility] 03/03/2015, 9:54 AM   Note: This office note was prepared with Dragon voice recognition system technology. Any transcriptional errors that result from  this process are unintentional.

## 2015-03-04 ENCOUNTER — Other Ambulatory Visit: Payer: Self-pay | Admitting: *Deleted

## 2015-03-04 MED ORDER — FREESTYLE LANCETS MISC: Status: DC

## 2015-03-04 MED ORDER — FREESTYLE FREEDOM LITE W/DEVICE KIT: PACK | Status: DC

## 2015-03-04 MED ORDER — GLUCOSE BLOOD VI STRP: ORAL_STRIP | Status: DC

## 2015-03-10 ENCOUNTER — Other Ambulatory Visit: Payer: Self-pay | Admitting: Endocrinology

## 2015-05-10 ENCOUNTER — Other Ambulatory Visit: Payer: Self-pay | Admitting: Endocrinology

## 2015-07-04 ENCOUNTER — Other Ambulatory Visit (INDEPENDENT_AMBULATORY_CARE_PROVIDER_SITE_OTHER): Payer: PPO

## 2015-07-04 DIAGNOSIS — E1165 Type 2 diabetes mellitus with hyperglycemia: Secondary | ICD-10-CM

## 2015-07-04 DIAGNOSIS — IMO0002 Reserved for concepts with insufficient information to code with codable children: Secondary | ICD-10-CM

## 2015-07-04 LAB — BASIC METABOLIC PANEL WITH GFR
BUN: 28 mg/dL — ABNORMAL HIGH (ref 6–23)
CO2: 30 meq/L (ref 19–32)
Calcium: 9.5 mg/dL (ref 8.4–10.5)
Chloride: 99 meq/L (ref 96–112)
Creatinine, Ser: 1.52 mg/dL — ABNORMAL HIGH (ref 0.40–1.20)
GFR: 36.03 mL/min — ABNORMAL LOW
Glucose, Bld: 150 mg/dL — ABNORMAL HIGH (ref 70–99)
Potassium: 4.8 meq/L (ref 3.5–5.1)
Sodium: 135 meq/L (ref 135–145)

## 2015-07-04 LAB — HEMOGLOBIN A1C: Hgb A1c MFr Bld: 7.7 % — ABNORMAL HIGH (ref 4.6–6.5)

## 2015-07-07 ENCOUNTER — Encounter: Payer: Self-pay | Admitting: Endocrinology

## 2015-07-07 ENCOUNTER — Other Ambulatory Visit: Payer: Self-pay | Admitting: Endocrinology

## 2015-07-07 ENCOUNTER — Ambulatory Visit (INDEPENDENT_AMBULATORY_CARE_PROVIDER_SITE_OTHER): Payer: PPO | Admitting: Endocrinology

## 2015-07-07 VITALS — BP 136/84 | HR 69 | Temp 98.2°F | Resp 16 | Ht 62.0 in | Wt 212.0 lb

## 2015-07-07 DIAGNOSIS — E1165 Type 2 diabetes mellitus with hyperglycemia: Secondary | ICD-10-CM | POA: Diagnosis not present

## 2015-07-07 DIAGNOSIS — IMO0002 Reserved for concepts with insufficient information to code with codable children: Secondary | ICD-10-CM

## 2015-07-07 DIAGNOSIS — I10 Essential (primary) hypertension: Secondary | ICD-10-CM | POA: Diagnosis not present

## 2015-07-07 DIAGNOSIS — Z23 Encounter for immunization: Secondary | ICD-10-CM | POA: Diagnosis not present

## 2015-07-07 DIAGNOSIS — R21 Rash and other nonspecific skin eruption: Secondary | ICD-10-CM | POA: Diagnosis not present

## 2015-07-07 NOTE — Progress Notes (Signed)
Patient ID: Shannon Kidd, female   DOB: Sep 25, 1946, 69 y.o.   MRN: [id number]           Reason for Appointment: Followup for Type 2 Diabetes  Referring physician: Leighton Ruff  History of Present Illness:          Diagnosis: Type 2 diabetes mellitus, date of diagnosis: 2000        Past history: She used Metformin for her diabetes for several years until early 2015 and was taking 1g bid before it was stopped. She thinks her blood sugars are well controlled with this. Also at some point she was given Actos but this was stopped because of edema. Metformin was stopped in early 2015 on the suggestion of a nephrologist because of her renal dysfunction Since early 2015 she had been taking Januvia 50 mg daily  Recent history:  When her blood sugars were not well controlled with A1c of 8% in 2015 she was started on low dose metformin She tends to have high fasting readings which were previously as high as 170 She also has been on glipizide ER instead of Januvia which she cannot afford Since 11/2014 she has been taking 1000 mg of metformin in the evenings, no side effects with this  More recently her A1c has gone back up to 7.7, previously down to 7, also her average sugar is higher at home, previously 121   Current blood sugar patterns and problems identified:  Although she has checked her sugars more often recently they are not well controlled  She now says that because of her being more busy and not getting time she is not planning her meals well and eating more starchy foods and ready to eat foods  Also has not been doing her exercise bike as much  In the last few days she has started to watch her diet better and she thinks her sugars are coming down  However she still has relatively high fasting readings at times She does think that her high sugars make her feel more fatigued and is feeling better in the last few days with that sugars coming down She will sometimes eat sweets  because of stress otherwise trying to watch her diet       Oral hypoglycemic drugs the patient is taking are: Glipizide ER 5 mg with 1000 mg metformin at bedtime      Side effects from medications have been: None Compliance with the medical regimen: Variable  Hypoglycemia: None    Glucose monitoring:  done 1-2 times a day         Glucometer:  Accu-Chek     Blood Glucose readings by time of day and averages from meter record  Mean values apply above for all meters except median for One Touch  PRE-MEAL Fasting Lunch Dinner Bedtime Overall  Glucose range:  105-217   137, 157    112-232    Mean/median:  153      152     Lab Results  Component Value Date   HGBA1C 7.7* 07/04/2015   HGBA1C 7.0* 02/25/2015   HGBA1C 7.5* 11/29/2014   Lab Results  Component Value Date   MICROALBUR 5.1* 03/03/2015   LDLCALC 85 08/25/2014   CREATININE 1.52* 07/04/2015    Self-care:      Meals: 3 meals per day. Is eating out daily, Breakfast is egg, toast. Some sweets and stress eating.      Exercise: on Bike 15 min, 2-4/7 days  Dietician visit: Most recent: 8/15.               Weight history: 195-245 Pounds previously  Wt Readings from Last 3 Encounters:  07/07/15 212 lb (96.163 kg)  03/03/15 210 lb 9.6 oz (95.528 kg)  12/02/14 209 lb 12.8 oz (95.165 kg)       Medication List       This list is accurate as of: 07/07/15 10:23 AM.  Always use your most recent med list.               amLODipine 5 MG tablet  Commonly known as:  NORVASC  TAKE ONE TABLET BY MOUTH ONCE DAILY     aspirin 81 MG tablet  Take 81 mg by mouth daily.     FREESTYLE FREEDOM LITE W/DEVICE Kit  Use to check blood sugar daily dx code E11.49     freestyle lancets  Use as instructed to check blood sugar once a day dx code E11.49     glipiZIDE 5 MG 24 hr tablet  Commonly known as:  GLUCOTROL XL  TAKE ONE TABLET BY MOUTH ONCE DAILY WITH BREAKFAST     glucose blood test strip  Commonly known as:  FREESTYLE  LITE  Use as instructed to check blood sugar once daily dx code E11.49     lovastatin 20 MG tablet  Commonly known as:  MEVACOR  Take 20 mg by mouth daily.     metFORMIN 1000 MG tablet  Commonly known as:  GLUCOPHAGE  Take 1 tablet (1,000 mg total) by mouth at bedtime.     MULTIVITAMIN PO  Take 1 tablet by mouth daily.     pramipexole 0.25 MG tablet  Commonly known as:  MIRAPEX  Take 0.25 mg by mouth daily.     vitamin B-12 1000 MCG tablet  Commonly known as:  CYANOCOBALAMIN  Take 1,000 mcg by mouth daily.     vitamin C 1000 MG tablet  Take 1,000 mg by mouth daily.     Vitamin D 2000 UNITS tablet  Take 2,000 Units by mouth daily.        Allergies:  Allergies  Allergen Reactions  . Actos [Pioglitazone]     Swelling     Past Medical History  Diagnosis Date  . HTN (hypertension)   . Diabetes mellitus   . Hyperlipemia   . Glaucoma   . Anemia   . Leg weakness   . Cervical myelopathy     s/p cervical fusion @ [facility]  . CKD (chronic kidney disease), stage III   . Decreased pedal pulses     normal ABI 02/03/13    No past surgical history on file.  Family History  Problem Relation Age of Onset  . Diabetes Mother   . Cancer Mother     Colon  . Diabetes Sister   . Diabetes Paternal Grandmother     Social History:  reports that she has never smoked. She has never used smokeless tobacco. Her alcohol and drug histories are not on file.    Review of Systems   She is asking about a nonpruritic rash on her lower legs for some time.  Has taken Benadryl and hydrocortisone without relief.  Has not talked to her PCP      Lipids: She has been treated with lovastatin low-dose with adequate control of LDL       Lab Results  Component Value Date   CHOL 145 08/25/2014   HDL 34.00*  08/25/2014   LDLCALC 85 08/25/2014   TRIG 129.0 08/25/2014   CHOLHDL 4 08/25/2014                  The blood pressure has been controlled with Norvasc alone Blood pressure  slightly higher today Previously was getting hyperkalemia and her lisinopril HCTZ was stopped  Leg edema: She has a little ankle swelling at the end of the day         No history of Numbness, has tingling mostly in left along with some restless legs at night; no burning in feet   Diabetic foot exam done in 9/16  Chronic kidney disease of unclear etiology, followed by nephrologist annually, creatinine slightly worse on this visit, does not use any nonsteroidal anti-inflammatory drugs over the counter  Lab Results  Component Value Date   CREATININE 1.52* 07/04/2015     LABS:  Appointment on 07/04/2015  Component Date Value Ref Range Status  . Hgb A1c MFr Bld 07/04/2015 7.7* 4.6 - 6.5 % Final   Glycemic Control Guidelines for People with Diabetes:Non Diabetic:  <6%Goal of Therapy: <7%Additional Action Suggested:  >8%   . Sodium 07/04/2015 135  135 - 145 mEq/L Final  . Potassium 07/04/2015 4.8  3.5 - 5.1 mEq/L Final  . Chloride 07/04/2015 99  96 - 112 mEq/L Final  . CO2 07/04/2015 30  19 - 32 mEq/L Final  . Glucose, Bld 07/04/2015 150* 70 - 99 mg/dL Final  . BUN 07/04/2015 28* 6 - 23 mg/dL Final  . Creatinine, Ser 07/04/2015 1.52* 0.40 - 1.20 mg/dL Final  . Calcium 07/04/2015 9.5  8.4 - 10.5 mg/dL Final  . GFR 07/04/2015 36.03* >60.00 mL/min Final    Physical Examination:  BP 136/84 mmHg  Pulse 69  Temp(Src) 98.2 F (36.8 C)  Resp 16  Ht '5\' 2"'  (1.575 m)  Wt 212 lb (96.163 kg)  BMI 38.77 kg/m2  SpO2 96%   Diabetic foot exam shows normal monofilament sensation in the toes and plantar surfaces, no skin lesions or ulcers on the feet and normal pedal pulses She has a macular rash on her left lower leg, mild on the right also Trace ankle edema  ASSESSMENT:  Diabetes type 2 with BMI over 40     She is currently taking glipizide ER 5 mg with low dose metformin, 1000 mg at night Her blood sugars are overall relatively higher with A1c going up to 7.7  He thinks this is from  her been inconsistent with diet Also not exercising as much She does feel better when her blood sugars are under control and she plans to continue improving her diet Also recommended resuming his exercise as before  No evidence of neuropathy of feet on her exam  HYPERTENSION: She has fair control with amlodipine 5 mg, this is followed by PCP and nephrologist  PLAN:     Continue metformin 1000 mg at night since she is tolerating very well, although renal function is slightly worse she is not on the maximum dose.Subjectively she is doing well and will keep her on the same dose for now   Consistent diet and exercise  A1c to be checked in 3 months, may consider additional medication if not improved, Potentially may need to small dose of basal insulin at bedtime if fasting blood sugar continues to go up   Continue glipizide ER 5 mg daily  Advised her to follow-up with PCP regarding rash on her lower leg  Continue to follow  renal function To have follow-up with nephrologist next month, May need to adjust her antihypertensive also  Follow-up with PCP regarding leg rash  KUMAR,AJAY 07/07/2015, 10:23 AM   Note: This office note was prepared with Estate agent. Any transcriptional errors that result from this process are unintentional.

## 2015-08-13 ENCOUNTER — Other Ambulatory Visit: Payer: Self-pay | Admitting: Endocrinology

## 2015-09-12 ENCOUNTER — Other Ambulatory Visit: Payer: Self-pay | Admitting: Endocrinology

## 2015-10-04 ENCOUNTER — Other Ambulatory Visit (INDEPENDENT_AMBULATORY_CARE_PROVIDER_SITE_OTHER): Payer: PPO

## 2015-10-04 DIAGNOSIS — E1165 Type 2 diabetes mellitus with hyperglycemia: Secondary | ICD-10-CM | POA: Diagnosis not present

## 2015-10-04 DIAGNOSIS — IMO0002 Reserved for concepts with insufficient information to code with codable children: Secondary | ICD-10-CM

## 2015-10-04 LAB — LIPID PANEL
Cholesterol: 129 mg/dL (ref 0–200)
HDL: 35.9 mg/dL — ABNORMAL LOW
LDL Cholesterol: 69 mg/dL (ref 0–99)
NonHDL: 93.49
Total CHOL/HDL Ratio: 4
Triglycerides: 120 mg/dL (ref 0.0–149.0)
VLDL: 24 mg/dL (ref 0.0–40.0)

## 2015-10-04 LAB — HEMOGLOBIN A1C: Hgb A1c MFr Bld: 6.6 % — ABNORMAL HIGH (ref 4.6–6.5)

## 2015-10-04 LAB — COMPREHENSIVE METABOLIC PANEL WITH GFR
ALT: 19 U/L (ref 0–35)
AST: 22 U/L (ref 0–37)
Albumin: 3.8 g/dL (ref 3.5–5.2)
Alkaline Phosphatase: 75 U/L (ref 39–117)
BUN: 30 mg/dL — ABNORMAL HIGH (ref 6–23)
CO2: 31 meq/L (ref 19–32)
Calcium: 10.2 mg/dL (ref 8.4–10.5)
Chloride: 100 meq/L (ref 96–112)
Creatinine, Ser: 1.42 mg/dL — ABNORMAL HIGH (ref 0.40–1.20)
GFR: 38.95 mL/min — ABNORMAL LOW
Glucose, Bld: 102 mg/dL — ABNORMAL HIGH (ref 70–99)
Potassium: 4.9 meq/L (ref 3.5–5.1)
Sodium: 137 meq/L (ref 135–145)
Total Bilirubin: 0.4 mg/dL (ref 0.2–1.2)
Total Protein: 7.2 g/dL (ref 6.0–8.3)

## 2015-10-07 ENCOUNTER — Other Ambulatory Visit: Payer: Self-pay | Admitting: *Deleted

## 2015-10-07 ENCOUNTER — Ambulatory Visit (INDEPENDENT_AMBULATORY_CARE_PROVIDER_SITE_OTHER): Payer: PPO | Admitting: Endocrinology

## 2015-10-07 ENCOUNTER — Encounter: Payer: Self-pay | Admitting: Endocrinology

## 2015-10-07 VITALS — BP 138/82 | HR 72 | Temp 98.2°F | Resp 14 | Ht 62.0 in | Wt 205.6 lb

## 2015-10-07 DIAGNOSIS — E119 Type 2 diabetes mellitus without complications: Secondary | ICD-10-CM

## 2015-10-07 MED ORDER — GLIPIZIDE ER 2.5 MG PO TB24: 2.5000 mg | ORAL_TABLET | Freq: Every day | ORAL | Status: DC

## 2015-10-07 NOTE — Patient Instructions (Signed)
Reduce Glipizide

## 2015-10-07 NOTE — Progress Notes (Signed)
Patient ID: Shannon Kidd, female   DOB: 1945-12-09, 69 y.o.   MRN: [id number]           Reason for Appointment: Followup for Type 2 Diabetes  Referring physician: Leighton Ruff  History of Present Illness:          Diagnosis: Type 2 diabetes mellitus, date of diagnosis: 2000        Past history: She used Metformin for her diabetes for several years until early 2015 and was taking 1g bid before it was stopped. She thinks her blood sugars are well controlled with this. Also at some point she was given Actos but this was stopped because of edema. Metformin was stopped in early 2015 on the suggestion of a nephrologist because of her renal dysfunction Since early 2015 she had been taking Januvia 50 mg daily  Recent history:  When her blood sugars were not well controlled with A1c of 8% in 2015 she was started on low dose metformin She tends to have high fasting readings which were previously as high as 170 She also has been on glipizide ER instead of Januvia which she cannot afford Since 11/2014 she has been taking 1000 mg of metformin in the evenings, no side effects with this   A1c is now down to 6.6 and she thinks she is doing much better with compliance with her diet  Current blood sugar patterns and problems identified:  Without any change in medications her blood sugars are much better controlled now  She thinks this is from her trying to eat healthier and has lost 7 pounds  She has fairly good blood sugar throughout the day and has checked more often in the afternoons and evenings in the last week or so  She does not think she feels hypoglycemic when blood sugars are as low as 64      Oral hypoglycemic drugs the patient is taking are: Glipizide ER 5 mg with 1000 mg metformin at bedtime      Side effects from medications have been: None Compliance with the medical regimen: Variable  Hypoglycemia: None    Glucose monitoring:  done 1-2 times a day         Glucometer:   Accu-Chek     Blood Glucose readings by time of day and averages from meter record  Mean values apply above for all meters except median for One Touch  PRE-MEAL Fasting Lunch Dinner Bedtime Overall  Glucose range:  88-115    64-101   87-158    Mean/median:  109      101    POST-MEAL PC Breakfast PC Lunch PC Dinner  Glucose range:  141     Mean/median:         Lab Results  Component Value Date   HGBA1C 6.6* 10/04/2015   HGBA1C 7.7* 07/04/2015   HGBA1C 7.0* 02/25/2015   Lab Results  Component Value Date   MICROALBUR 5.1* 03/03/2015   LDLCALC 69 10/04/2015   CREATININE 1.42* 10/04/2015    Self-care:      Meals: 3 meals per day. Is eating out daily, Breakfast is egg, toast. Some sweets and stress eating.      Exercise: on Bike 15 min, 2-4/7 days         Dietician visit: Most recent: 8/15.               Weight history: 195-245 Pounds previously  Wt Readings from Last 3 Encounters:  10/07/15 205 lb 9.6 oz (  93.26 kg)  07/07/15 212 lb (96.163 kg)  03/03/15 210 lb 9.6 oz (95.528 kg)       Medication List       This list is accurate as of: 10/07/15  1:23 PM.  Always use your most recent med list.               amLODipine 5 MG tablet  Commonly known as:  NORVASC  TAKE ONE TABLET BY MOUTH ONCE DAILY     aspirin 81 MG tablet  Take 81 mg by mouth daily.     FREESTYLE FREEDOM LITE w/Device Kit  Use to check blood sugar daily dx code E11.49     freestyle lancets  Use as instructed to check blood sugar once a day dx code E11.49     glipiZIDE 5 MG 24 hr tablet  Commonly known as:  GLUCOTROL XL  TAKE ONE TABLET BY MOUTH ONCE DAILY WITH  BREAKFAST     glucose blood test strip  Commonly known as:  FREESTYLE LITE  Use as instructed to check blood sugar once daily dx code E11.49     lovastatin 20 MG tablet  Commonly known as:  MEVACOR  Take 20 mg by mouth daily.     metFORMIN 1000 MG tablet  Commonly known as:  GLUCOPHAGE  TAKE ONE TABLET BY MOUTH AT BEDTIME       MULTIVITAMIN PO  Take 1 tablet by mouth daily.     pramipexole 0.25 MG tablet  Commonly known as:  MIRAPEX  Take 0.25 mg by mouth daily.     vitamin B-12 1000 MCG tablet  Commonly known as:  CYANOCOBALAMIN  Take 1,000 mcg by mouth daily.     vitamin C 1000 MG tablet  Take 1,000 mg by mouth daily.     Vitamin D 2000 units tablet  Take 2,000 Units by mouth daily.        Allergies:  Allergies  Allergen Reactions  . Actos [Pioglitazone]     Swelling     Past Medical History  Diagnosis Date  . HTN (hypertension)   . Diabetes mellitus ([place])   . Hyperlipemia   . Glaucoma   . Anemia   . Leg weakness   . Cervical myelopathy ([facility])     s/p cervical fusion @ [facility]  . CKD (chronic kidney disease), stage III   . Decreased pedal pulses     normal ABI 02/03/13    No past surgical history on file.  Family History  Problem Relation Age of Onset  . Diabetes Mother   . Cancer Mother     Colon  . Diabetes Sister   . Diabetes Paternal Grandmother     Social History:  reports that she has never smoked. She has never used smokeless tobacco. Her alcohol and drug histories are not on file.    Review of Systems       Lipids: She has been treated with lovastatin low-dose with adequate control of LDL       Lab Results  Component Value Date   CHOL 129 10/04/2015   HDL 35.90* 10/04/2015   LDLCALC 69 10/04/2015   TRIG 120.0 10/04/2015   CHOLHDL 4 10/04/2015                  The blood pressure has been controlled with Norvasc alone Blood pressure is better controlled Previously was getting hyperkalemia and her lisinopril HCTZ was stopped  Leg edema: She has a little ankle  swelling at the end of the day         No history of Numbness, has tingling mostly in left along with some restless legs at night; relieved by Mirapex Diabetic foot exam done in 9/16  Chronic kidney disease of unclear etiology, followed by nephrologist annually, creatinine slightly better on  this visit, previously had been gradually increasing She saw the nephrologist in October   Lab Results  Component Value Date   CREATININE 1.42* 10/04/2015     LABS:  Lab on 10/04/2015  Component Date Value Ref Range Status  . Hgb A1c MFr Bld 10/04/2015 6.6* 4.6 - 6.5 % Final   Glycemic Control Guidelines for People with Diabetes:Non Diabetic:  <6%Goal of Therapy: <7%Additional Action Suggested:  >8%   . Sodium 10/04/2015 137  135 - 145 mEq/L Final  . Potassium 10/04/2015 4.9  3.5 - 5.1 mEq/L Final  . Chloride 10/04/2015 100  96 - 112 mEq/L Final  . CO2 10/04/2015 31  19 - 32 mEq/L Final  . Glucose, Bld 10/04/2015 102* 70 - 99 mg/dL Final  . BUN 10/04/2015 30* 6 - 23 mg/dL Final  . Creatinine, Ser 10/04/2015 1.42* 0.40 - 1.20 mg/dL Final  . Total Bilirubin 10/04/2015 0.4  0.2 - 1.2 mg/dL Final  . Alkaline Phosphatase 10/04/2015 75  39 - 117 U/L Final  . AST 10/04/2015 22  0 - 37 U/L Final  . ALT 10/04/2015 19  0 - 35 U/L Final  . Total Protein 10/04/2015 7.2  6.0 - 8.3 g/dL Final  . Albumin 10/04/2015 3.8  3.5 - 5.2 g/dL Final  . Calcium 10/04/2015 10.2  8.4 - 10.5 mg/dL Final  . GFR 10/04/2015 38.95* >60.00 mL/min Final  . Cholesterol 10/04/2015 129  0 - 200 mg/dL Final   ATP III Classification       Desirable:  < 200 mg/dL               Borderline High:  200 - 239 mg/dL          High:  > = 240 mg/dL  . Triglycerides 10/04/2015 120.0  0.0 - 149.0 mg/dL Final   Normal:  <150 mg/dLBorderline High:  150 - 199 mg/dL  . HDL 10/04/2015 35.90* >39.00 mg/dL Final  . VLDL 10/04/2015 24.0  0.0 - 40.0 mg/dL Final  . LDL Cholesterol 10/04/2015 69  0 - 99 mg/dL Final  . Total CHOL/HDL Ratio 10/04/2015 4   Final                  Men          Women1/2 Average Risk     3.4          3.3Average Risk          5.0          4.42X Average Risk          9.6          7.13X Average Risk          15.0          11.0                      . NonHDL 10/04/2015 93.49   Final   NOTE:  Non-HDL goal should be  30 mg/dL higher than patient's LDL goal (i.e. LDL goal of < 70 mg/dL, would have non-HDL goal of < 100 mg/dL)    Physical Examination:  BP 138/82 mmHg  Pulse 72  Temp(Src) 98.2 F (36.8 C)  Resp 14  Ht '5\' 2"'  (1.575 m)  Wt 205 lb 9.6 oz (93.26 kg)  BMI 37.60 kg/m2  SpO2 96%   ASSESSMENT:  Diabetes type 2 with obesity    BMI over 40     She is currently taking glipizide ER 5 mg with low dose metformin, 1000 mg at night Her blood sugars are overall relatively higher with A1c going up to 7.7  He thinks this is from her been inconsistent with diet Also not exercising as much She does feel better when her blood sugars are under control and she plans to continue improving her diet Also recommended resuming his exercise as before  No evidence of neuropathy of feet on her exam  HYPERTENSION: She has fair control with amlodipine 5 mg, this is followed by PCP and nephrologist  PLAN:     Continue metformin 1000 mg at night since she is tolerating very well, although renal function is slightly worse she is not on the maximum dose.Subjectively she is doing well and will keep her on the same dose for now   Consistent diet and exercise  A1c to be checked in 3 months, may consider additional medication if not improved, Potentially may need to small dose of basal insulin at bedtime if fasting blood sugar continues to go up   Continue glipizide ER 5 mg daily  Advised her to follow-up with PCP regarding rash on her lower leg  Continue to follow renal function To have follow-up with nephrologist next month, May need to adjust her antihypertensive also  Follow-up with PCP regarding leg rash  Ossie Yebra 10/07/2015, 1:23 PM   Note: This office note was prepared with Estate agent. Any transcriptional errors that result from this process are unintentional.

## 2016-01-02 ENCOUNTER — Other Ambulatory Visit: Payer: Self-pay

## 2016-01-02 DIAGNOSIS — Z1231 Encounter for screening mammogram for malignant neoplasm of breast: Secondary | ICD-10-CM

## 2016-01-03 ENCOUNTER — Other Ambulatory Visit (INDEPENDENT_AMBULATORY_CARE_PROVIDER_SITE_OTHER): Payer: PPO

## 2016-01-03 DIAGNOSIS — E119 Type 2 diabetes mellitus without complications: Secondary | ICD-10-CM

## 2016-01-03 LAB — COMPREHENSIVE METABOLIC PANEL WITH GFR
ALT: 19 U/L (ref 0–35)
AST: 21 U/L (ref 0–37)
Albumin: 3.9 g/dL (ref 3.5–5.2)
Alkaline Phosphatase: 72 U/L (ref 39–117)
BUN: 27 mg/dL — ABNORMAL HIGH (ref 6–23)
CO2: 32 meq/L (ref 19–32)
Calcium: 10.4 mg/dL (ref 8.4–10.5)
Chloride: 100 meq/L (ref 96–112)
Creatinine, Ser: 1.39 mg/dL — ABNORMAL HIGH (ref 0.40–1.20)
GFR: 39.89 mL/min — ABNORMAL LOW
Glucose, Bld: 84 mg/dL (ref 70–99)
Potassium: 4.7 meq/L (ref 3.5–5.1)
Sodium: 137 meq/L (ref 135–145)
Total Bilirubin: 0.4 mg/dL (ref 0.2–1.2)
Total Protein: 7.5 g/dL (ref 6.0–8.3)

## 2016-01-03 LAB — HEMOGLOBIN A1C: Hgb A1c MFr Bld: 7 % — ABNORMAL HIGH (ref 4.6–6.5)

## 2016-01-06 ENCOUNTER — Encounter: Payer: Self-pay | Admitting: Endocrinology

## 2016-01-06 ENCOUNTER — Ambulatory Visit (INDEPENDENT_AMBULATORY_CARE_PROVIDER_SITE_OTHER): Payer: PPO | Admitting: Endocrinology

## 2016-01-06 VITALS — BP 136/68 | HR 78 | Temp 98.1°F | Resp 14 | Ht 62.0 in | Wt 203.0 lb

## 2016-01-06 DIAGNOSIS — E119 Type 2 diabetes mellitus without complications: Secondary | ICD-10-CM | POA: Diagnosis not present

## 2016-01-06 NOTE — Progress Notes (Signed)
Patient ID: Shannon Kidd, female   DOB: 06-29-1946, 70 y.o.   MRN: [id number]           Reason for Appointment: Followup for Type 2 Diabetes  Referring physician: Leighton Ruff  History of Present Illness:          Diagnosis: Type 2 diabetes mellitus, date of diagnosis: 2000        Past history: She used Metformin for her diabetes for several years until early 2015 and was taking 1g bid before it was stopped. She thinks her blood sugars are well controlled with this. Also at some point she was given Actos but this was stopped because of edema. Metformin was stopped in early 2015 on the suggestion of a nephrologist because of her renal dysfunction Since early 2015 she had been taking Januvia 50 mg daily  Recent history:  When her blood sugars were not well controlled with A1c of 8% in 2015 she was started on low dose metformin She tends to have high fasting readings which were previously as high as 170 She also has been on glipizide ER instead of Januvia which she cannot afford Since 11/2014 she has been taking 1000 mg of metformin in the evenings  A1c is now 7%, previously 6.6  Current management, blood sugar patterns and problems identified:  She thinks she is trying to eat healthier and has lost another 2 pounds, however when she had some issues with family illness about a month or so ago she was not able to watch her diet consistently  Although she has been inconsistent with exercise she is trying to walk some and plans to join a gym  She has fairly good blood sugar throughout the day and has checked some readings after meals also but mostly in the morning  Fasting blood sugars are still mildly increased overall  Has sporadic high readings after lunch or supper but not consistently  Lowest blood sugar has been 84      Oral hypoglycemic drugs the patient is taking are: Glipizide ER 5 mg with 1000 mg metformin at bedtime      Side effects from medications have been:  None Compliance with the medical regimen: Variable  Hypoglycemia: None    Glucose monitoring:  done 1-2 times a day         Glucometer:  Accu-Chek     Blood Glucose readings by time of day and averages from meter record  Mean values apply above for all meters except median for One Touch  PRE-MEAL Fasting Lunch Dinner Bedtime Overall  Glucose range: 91-164  84, 134  109-176  96-179    Mean/median: 128   140  138  128      Lab Results  Component Value Date   HGBA1C 7.0* 01/03/2016   HGBA1C 6.6* 10/04/2015   HGBA1C 7.7* 07/04/2015   Lab Results  Component Value Date   MICROALBUR 5.1* 03/03/2015   LDLCALC 69 10/04/2015   CREATININE 1.39* 01/03/2016    Self-care:      Meals: 3 meals per day. Is eating out Frequently, Breakfast is egg, toast.     Exercise: walking/some exercise Bike 15 min, 2-4/7 days         Dietician visit: Most recent: 8/15.               Weight history: 195-245 Pounds previously  Wt Readings from Last 3 Encounters:  01/06/16 203 lb (92.08 kg)  10/07/15 205 lb 9.6 oz (93.26 kg)  07/07/15 212 lb (96.163 kg)       Medication List       This list is accurate as of: 01/06/16 10:20 AM.  Always use your most recent med list.               amLODipine 5 MG tablet  Commonly known as:  NORVASC  TAKE ONE TABLET BY MOUTH ONCE DAILY     aspirin 81 MG tablet  Take 81 mg by mouth daily.     FREESTYLE FREEDOM LITE w/Device Kit  Use to check blood sugar daily dx code E11.49     freestyle lancets  Use as instructed to check blood sugar once a day dx code E11.49     glipiZIDE 2.5 MG 24 hr tablet  Commonly known as:  GLUCOTROL XL  Take 1 tablet (2.5 mg total) by mouth daily with breakfast.     glucose blood test strip  Commonly known as:  FREESTYLE LITE  Use as instructed to check blood sugar once daily dx code E11.49     lovastatin 20 MG tablet  Commonly known as:  MEVACOR  Take 20 mg by mouth daily.     metFORMIN 1000 MG tablet  Commonly known  as:  GLUCOPHAGE  TAKE ONE TABLET BY MOUTH AT BEDTIME     MULTIVITAMIN PO  Take 1 tablet by mouth daily.     pramipexole 0.25 MG tablet  Commonly known as:  MIRAPEX  Take 0.25 mg by mouth daily.     sertraline 25 MG tablet  Commonly known as:  ZOLOFT  Take 25 mg by mouth daily.     vitamin B-12 1000 MCG tablet  Commonly known as:  CYANOCOBALAMIN  Take 1,000 mcg by mouth daily.     vitamin C 1000 MG tablet  Take 1,000 mg by mouth daily.     Vitamin D 2000 units tablet  Take 2,000 Units by mouth daily.        Allergies:  Allergies  Allergen Reactions  . Actos [Pioglitazone]     Swelling     Past Medical History  Diagnosis Date  . HTN (hypertension)   . Diabetes mellitus ([place])   . Hyperlipemia   . Glaucoma   . Anemia   . Leg weakness   . Cervical myelopathy ([facility])     s/p cervical fusion @ [facility]  . CKD (chronic kidney disease), stage III   . Decreased pedal pulses     normal ABI 02/03/13    No past surgical history on file.  Family History  Problem Relation Age of Onset  . Diabetes Mother   . Cancer Mother     Colon  . Diabetes Sister   . Diabetes Paternal Grandmother     Social History:  reports that she has never smoked. She has never used smokeless tobacco. Her alcohol and drug histories are not on file.    Review of Systems       Lipids: She has been treated with lovastatin low-dose with adequate control of LDL HDL tends to be low       Lab Results  Component Value Date   CHOL 129 10/04/2015   HDL 35.90* 10/04/2015   LDLCALC 69 10/04/2015   TRIG 120.0 10/04/2015   CHOLHDL 4 10/04/2015                  The blood pressure has been controlled with Norvasc alone Previously was getting hyperkalemia and her lisinopril HCTZ was  stopped          No history of Numbness, has tingling mostly in left along with some restless legs at night; relieved by Mirapex Diabetic foot exam done in 9/16  Chronic kidney disease of unclear etiology,  followed by nephrologist annually, creatinine slightly better on this visit, previously had been gradually increasing She saw the nephrologist in October   Lab Results  Component Value Date   CREATININE 1.39* 01/03/2016     LABS:  Lab on 01/03/2016  Component Date Value Ref Range Status  . Hgb A1c MFr Bld 01/03/2016 7.0* 4.6 - 6.5 % Final   Glycemic Control Guidelines for People with Diabetes:Non Diabetic:  <6%Goal of Therapy: <7%Additional Action Suggested:  >8%   . Sodium 01/03/2016 137  135 - 145 mEq/L Final  . Potassium 01/03/2016 4.7  3.5 - 5.1 mEq/L Final  . Chloride 01/03/2016 100  96 - 112 mEq/L Final  . CO2 01/03/2016 32  19 - 32 mEq/L Final  . Glucose, Bld 01/03/2016 84  70 - 99 mg/dL Final  . BUN 01/03/2016 27* 6 - 23 mg/dL Final  . Creatinine, Ser 01/03/2016 1.39* 0.40 - 1.20 mg/dL Final  . Total Bilirubin 01/03/2016 0.4  0.2 - 1.2 mg/dL Final  . Alkaline Phosphatase 01/03/2016 72  39 - 117 U/L Final  . AST 01/03/2016 21  0 - 37 U/L Final  . ALT 01/03/2016 19  0 - 35 U/L Final  . Total Protein 01/03/2016 7.5  6.0 - 8.3 g/dL Final  . Albumin 01/03/2016 3.9  3.5 - 5.2 g/dL Final  . Calcium 01/03/2016 10.4  8.4 - 10.5 mg/dL Final  . GFR 01/03/2016 39.89* >60.00 mL/min Final    Physical Examination:  BP 136/68 mmHg  Pulse 78  Temp(Src) 98.1 F (36.7 C)  Resp 14  Ht '5\' 2"'  (1.575 m)  Wt 203 lb (92.08 kg)  BMI 37.12 kg/m2  SpO2 98%   ASSESSMENT:  Diabetes type 2 with obesity, BMI 87 See history of present illness for detailed discussion of current diabetes management, blood sugar patterns and problems identified   She is currently taking glipizide ER 5 mg with low dose metformin, 1000 mg at night Her blood sugars are overall well controlled considering her multiple problems, age with A1c 7% She does have some fluctuation in her blood sugars However can do better with exercise regimen to continue her weight loss Generally doing well on diet although because  of family illness has not been able to be consistent over the last 2 months  PLAN:     Continue metformin 1000 mg at night since she is tolerating very well and renal function is adequate for this dose   Consistent diet and exercise   Continue glipizide ER 5 mg daily   Follow-up in 4 months Continue to follow renal function    Jeremy Ditullio 01/06/2016, 10:20 AM   Note: This office note was prepared with Estate agent. Any transcriptional errors that result from this process are unintentional.

## 2016-01-11 ENCOUNTER — Ambulatory Visit
Admission: RE | Admit: 2016-01-11 | Discharge: 2016-01-11 | Disposition: A | Discharge status: Home / Self Care | Payer: PPO | Source: Ambulatory Visit

## 2016-01-11 DIAGNOSIS — Z1231 Encounter for screening mammogram for malignant neoplasm of breast: Secondary | ICD-10-CM

## 2016-01-18 ENCOUNTER — Telehealth: Payer: Self-pay | Admitting: Cardiology

## 2016-01-18 ENCOUNTER — Telehealth: Payer: Self-pay

## 2016-01-18 NOTE — Telephone Encounter (Signed)
FAXED NOTES TO [facility] 

## 2016-01-18 NOTE — Telephone Encounter (Signed)
Received records from [facility] for appointment on 01/24/16 with Dr Jens Somrenshaw.  [facility] for Dr Ludwig Clarksrenshaw's schedule on 01/24/16. lp

## 2016-01-23 NOTE — Progress Notes (Signed)
HPI: 70 year old female for evaluation of murmur. Patient has a history of rheumatic fever by her report. She does have dyspnea on exertion but no orthopnea or PND. Occasional mild pedal edema. Over the past month she has had occasional pain under her left breast. It typically occurs at night and lasts 30 minutes. No radiation or associated symptoms. Not pleuritic or positional. She does not have exertional chest pain. She now presents for further evaluation.  Current Outpatient Prescriptions  Medication Sig Dispense Refill  . amLODipine (NORVASC) 5 MG tablet TAKE ONE TABLET BY MOUTH ONCE DAILY 30 tablet 5  . Ascorbic Acid (VITAMIN C) 1000 MG tablet Take 1,000 mg by mouth daily.    Marland Kitchen aspirin 81 MG tablet Take 81 mg by mouth daily.    . Blood Glucose Monitoring Suppl (FREESTYLE FREEDOM LITE) W/DEVICE KIT Use to check blood sugar daily dx code E11.49 1 each 0  . Cholecalciferol (VITAMIN D) 2000 UNITS tablet Take 2,000 Units by mouth daily.    Marland Kitchen glipiZIDE (GLUCOTROL XL) 2.5 MG 24 hr tablet Take 1 tablet (2.5 mg total) by mouth daily with breakfast. 30 tablet 5  . glucose blood (FREESTYLE LITE) test strip Use as instructed to check blood sugar once daily dx code E11.49 100 each 3  . Lancets (FREESTYLE) lancets Use as instructed to check blood sugar once a day dx code E11.49 100 each 3  . lovastatin (MEVACOR) 20 MG tablet Take 20 mg by mouth daily.    . metFORMIN (GLUCOPHAGE) 1000 MG tablet TAKE ONE TABLET BY MOUTH AT BEDTIME 90 tablet 1  . Multiple Vitamins-Minerals (MULTIVITAMIN PO) Take 1 tablet by mouth daily.    . pramipexole (MIRAPEX) 0.25 MG tablet Take 0.25 mg by mouth daily.    . sertraline (ZOLOFT) 25 MG tablet Take 25 mg by mouth daily.    . vitamin B-12 (CYANOCOBALAMIN) 1000 MCG tablet Take 1,000 mcg by mouth daily.     No current facility-administered medications for this visit.    Allergies  Allergen Reactions  . Actos [Pioglitazone]     Swelling      Past Medical  History  Diagnosis Date  . HTN (hypertension)   . Diabetes mellitus ([place])   . Hyperlipemia   . Glaucoma   . Anemia   . Cervical myelopathy ([facility])     s/p cervical fusion @ [facility]  . CKD (chronic kidney disease), stage III   . Decreased pedal pulses     normal ABI 02/03/13  . Hypothyroid     When pregnant  . Rheumatic fever     Past Surgical History  Procedure Laterality Date  . Neck surgery    . Tonsillectomy    . Vaginal hysterectomy    . Tubal ligation    . Appendectomy      Social History   Social History  . Marital Status: Married    Spouse Name: N/A  . Number of Children: 2  . Years of Education: N/A   Occupational History  . Not on file.   Social History Main Topics  . Smoking status: Never Smoker   . Smokeless tobacco: Never Used  . Alcohol Use: No  . Drug Use: Not on file  . Sexual Activity: Not on file   Other Topics Concern  . Not on file   Social History Narrative    Family History  Problem Relation Age of Onset  . Diabetes Mother   . Cancer Mother  Colon  . Diabetes Sister   . Diabetes Paternal Grandmother     ROS: no fevers or chills, productive cough, hemoptysis, dysphasia, odynophagia, melena, hematochezia, dysuria, hematuria, rash, seizure activity, orthopnea, PND, claudication. Remaining systems are negative.  Physical Exam:   Blood pressure 148/64, pulse 66, height 5' (1.524 m), weight 201 lb (91.173 kg).  General:  Well developed/obese in NAD Skin warm/dry Patient not depressed No peripheral clubbing Back-normal HEENT-normal/normal eyelids Neck supple/normal carotid upstroke bilaterally; bilateral bruits; no JVD; no thyromegaly chest - CTA/ normal expansion CV - RRR/normal S1 and S2; no rubs or gallops;  PMI nondisplaced; 2/6 systolic murmur Abdomen -NT/ND, no HSM, no mass, + bowel sounds, no bruit 2+ femoral pulses, no bruits Ext-trace edema, no chords, 1+ DP Neuro-grossly nonfocal  ECG Normal sinus rhythm at a  rate of 66. Cannot rule out prior anterior infarct.   

## 2016-01-24 ENCOUNTER — Encounter: Payer: Self-pay | Admitting: Cardiology

## 2016-01-24 ENCOUNTER — Ambulatory Visit (INDEPENDENT_AMBULATORY_CARE_PROVIDER_SITE_OTHER): Payer: PPO | Admitting: Cardiology

## 2016-01-24 VITALS — BP 148/64 | HR 66 | Ht 60.0 in | Wt 201.0 lb

## 2016-01-24 DIAGNOSIS — R072 Precordial pain: Secondary | ICD-10-CM

## 2016-01-24 DIAGNOSIS — R0600 Dyspnea, unspecified: Secondary | ICD-10-CM

## 2016-01-24 DIAGNOSIS — I10 Essential (primary) hypertension: Secondary | ICD-10-CM

## 2016-01-24 DIAGNOSIS — I350 Nonrheumatic aortic (valve) stenosis: Secondary | ICD-10-CM

## 2016-01-24 DIAGNOSIS — R0989 Other specified symptoms and signs involving the circulatory and respiratory systems: Secondary | ICD-10-CM | POA: Diagnosis not present

## 2016-01-24 DIAGNOSIS — R011 Cardiac murmur, unspecified: Secondary | ICD-10-CM | POA: Diagnosis not present

## 2016-01-24 DIAGNOSIS — R079 Chest pain, unspecified: Secondary | ICD-10-CM | POA: Insufficient documentation

## 2016-01-24 NOTE — Assessment & Plan Note (Signed)
Discussed weight loss. 

## 2016-01-24 NOTE — Assessment & Plan Note (Signed)
Schedule carotid Dopplers to further assess. 

## 2016-01-24 NOTE — Assessment & Plan Note (Signed)
Symptoms atypical. Given 15 years of diabetes mellitus will arrange Lexiscan nuclear study for risk stratification.

## 2016-01-24 NOTE — Assessment & Plan Note (Signed)
Patient sounds to have aortic stenosis on examination. I will arrange an echocardiogram to further assess.

## 2016-01-24 NOTE — Assessment & Plan Note (Signed)
Continue statin. 

## 2016-01-24 NOTE — Assessment & Plan Note (Signed)
Blood pressure borderline. We will follow and adjust regimen as needed.

## 2016-01-24 NOTE — Patient Instructions (Signed)
Medication Instructions:   NO CHANGE  Testing/Procedures:  Your physician has requested that you have a lexiscan myoview. For further information please visit [email or web address]. Please follow instruction sheet, as given.   Your physician has requested that you have an echocardiogram. Echocardiography is a painless test that uses sound waves to create images of your heart. It provides your doctor with information about the size and shape of your heart and how well your heart's chambers and valves are working. This procedure takes approximately one hour. There are no restrictions for this procedure.   Your physician has requested that you have a carotid duplex. This test is an ultrasound of the carotid arteries in your neck. It looks at blood flow through these arteries that supply the brain with blood. Allow one hour for this exam. There are no restrictions or special instructions.   PLEASE SCHEDULE ALL AT [facility]  Follow-Up:  Your physician recommends that you schedule a follow-up appointment in: 8 WEEKS WITH DR Jens SomRENSHAW

## 2016-01-31 ENCOUNTER — Other Ambulatory Visit: Payer: Self-pay | Admitting: Endocrinology

## 2016-02-07 ENCOUNTER — Telehealth (HOSPITAL_COMMUNITY): Payer: Self-pay

## 2016-02-07 NOTE — Telephone Encounter (Signed)
Encounter complete. 

## 2016-02-09 ENCOUNTER — Ambulatory Visit (HOSPITAL_COMMUNITY)
Admission: RE | Admit: 2016-02-09 | Discharge: 2016-02-09 | Disposition: A | Discharge status: Home / Self Care | Payer: PPO | Source: Ambulatory Visit | Attending: Cardiology | Admitting: Cardiology

## 2016-02-09 ENCOUNTER — Ambulatory Visit (HOSPITAL_COMMUNITY)
Admission: RE | Admit: 2016-02-09 | Discharge: 2016-02-09 | Disposition: A | Discharge status: Home / Self Care | Payer: PPO | Source: Ambulatory Visit | Attending: Cardiovascular Disease | Admitting: Cardiovascular Disease

## 2016-02-09 DIAGNOSIS — R0989 Other specified symptoms and signs involving the circulatory and respiratory systems: Secondary | ICD-10-CM | POA: Diagnosis not present

## 2016-02-09 DIAGNOSIS — R0609 Other forms of dyspnea: Secondary | ICD-10-CM | POA: Insufficient documentation

## 2016-02-09 DIAGNOSIS — I6523 Occlusion and stenosis of bilateral carotid arteries: Secondary | ICD-10-CM | POA: Insufficient documentation

## 2016-02-09 DIAGNOSIS — Z6839 Body mass index (BMI) 39.0-39.9, adult: Secondary | ICD-10-CM | POA: Diagnosis not present

## 2016-02-09 DIAGNOSIS — E669 Obesity, unspecified: Secondary | ICD-10-CM | POA: Insufficient documentation

## 2016-02-09 DIAGNOSIS — N183 Chronic kidney disease, stage 3 (moderate): Secondary | ICD-10-CM | POA: Diagnosis not present

## 2016-02-09 DIAGNOSIS — E785 Hyperlipidemia, unspecified: Secondary | ICD-10-CM | POA: Insufficient documentation

## 2016-02-09 DIAGNOSIS — R5383 Other fatigue: Secondary | ICD-10-CM | POA: Diagnosis not present

## 2016-02-09 DIAGNOSIS — I129 Hypertensive chronic kidney disease with stage 1 through stage 4 chronic kidney disease, or unspecified chronic kidney disease: Secondary | ICD-10-CM | POA: Diagnosis not present

## 2016-02-09 DIAGNOSIS — R072 Precordial pain: Secondary | ICD-10-CM

## 2016-02-09 DIAGNOSIS — R42 Dizziness and giddiness: Secondary | ICD-10-CM | POA: Insufficient documentation

## 2016-02-09 DIAGNOSIS — E1122 Type 2 diabetes mellitus with diabetic chronic kidney disease: Secondary | ICD-10-CM | POA: Diagnosis not present

## 2016-02-09 LAB — MYOCARDIAL PERFUSION IMAGING
LV dias vol: 100 mL (ref 46–106)
LV sys vol: 46 mL
Peak HR: 80 {beats}/min
Rest HR: 60 {beats}/min
SDS: 0
SRS: 2
SSS: 2
TID: 1.37

## 2016-02-09 MED ORDER — REGADENOSON 0.4 MG/5ML IV SOLN
0.4000 mg | Freq: Once | INTRAVENOUS | Status: AC
  Administered 2016-02-09: 0.4 mg via INTRAVENOUS

## 2016-02-09 MED ORDER — TECHNETIUM TC 99M SESTAMIBI GENERIC - CARDIOLITE
10.9000 | Freq: Once | INTRAVENOUS | Status: AC | PRN
  Administered 2016-02-09: 10.9 via INTRAVENOUS

## 2016-02-09 MED ORDER — TECHNETIUM TC 99M SESTAMIBI GENERIC - CARDIOLITE
29.2000 | Freq: Once | INTRAVENOUS | Status: AC | PRN
  Administered 2016-02-09: 29.2 via INTRAVENOUS

## 2016-02-16 ENCOUNTER — Ambulatory Visit (HOSPITAL_COMMUNITY): Payer: PPO | Attending: Internal Medicine

## 2016-02-16 ENCOUNTER — Other Ambulatory Visit: Payer: Self-pay

## 2016-02-16 DIAGNOSIS — R0600 Dyspnea, unspecified: Secondary | ICD-10-CM | POA: Diagnosis present

## 2016-02-16 DIAGNOSIS — I1310 Hypertensive heart and chronic kidney disease without heart failure, with stage 1 through stage 4 chronic kidney disease, or unspecified chronic kidney disease: Secondary | ICD-10-CM | POA: Insufficient documentation

## 2016-02-16 DIAGNOSIS — N189 Chronic kidney disease, unspecified: Secondary | ICD-10-CM | POA: Diagnosis not present

## 2016-02-16 DIAGNOSIS — I352 Nonrheumatic aortic (valve) stenosis with insufficiency: Secondary | ICD-10-CM | POA: Insufficient documentation

## 2016-02-16 DIAGNOSIS — R011 Cardiac murmur, unspecified: Secondary | ICD-10-CM | POA: Diagnosis not present

## 2016-02-16 DIAGNOSIS — E785 Hyperlipidemia, unspecified: Secondary | ICD-10-CM | POA: Insufficient documentation

## 2016-02-16 DIAGNOSIS — E1122 Type 2 diabetes mellitus with diabetic chronic kidney disease: Secondary | ICD-10-CM | POA: Diagnosis not present

## 2016-02-16 DIAGNOSIS — I059 Rheumatic mitral valve disease, unspecified: Secondary | ICD-10-CM | POA: Diagnosis not present

## 2016-02-22 ENCOUNTER — Encounter: Payer: Self-pay | Admitting: Cardiology

## 2016-02-22 NOTE — Telephone Encounter (Signed)
This encounter was created in error - please disregard.

## 2016-02-22 NOTE — Telephone Encounter (Signed)
New message ° ° ° ° °Returning a call to the nurse °

## 2016-04-04 NOTE — Progress Notes (Signed)
HPI: FU AS. Patient has a history of rheumatic fever by her report. Echocardiogram May 2017 showed normal LV systolic function, Grade 1 diastolic dysfunction, moderate aortic stenosis with mean gradient 32 mmHg, trace aortic insufficiency. Nuclear study May 2017 showed no ischemia or infarction. Ejection fraction 54%. Carotid Dopplers May 2017 showed 1-39% bilateral stenosis. Since last seen /the patient has dyspnea with more extreme activities but not with routine activities. It is relieved with rest. It is not associated with chest pain. There is no orthopnea, PND or pedal edema. There is no syncope or palpitations. There is no exertional chest pain.   Current Outpatient Prescriptions  Medication Sig Dispense Refill  . amLODipine (NORVASC) 5 MG tablet TAKE ONE TABLET BY MOUTH ONCE DAILY 30 tablet 5  . Ascorbic Acid (VITAMIN C) 1000 MG tablet Take 1,000 mg by mouth daily.    Marland Kitchen aspirin 81 MG tablet Take 81 mg by mouth daily.    . Blood Glucose Monitoring Suppl (FREESTYLE FREEDOM LITE) W/DEVICE KIT Use to check blood sugar daily dx code E11.49 1 each 0  . Cholecalciferol (VITAMIN D) 2000 UNITS tablet Take 2,000 Units by mouth daily.    Marland Kitchen FREESTYLE LITE test strip USE TO CHECK BLOOD SUGAR ONCE DAILY 100 each 0  . glipiZIDE (GLUCOTROL XL) 2.5 MG 24 hr tablet Take 1 tablet (2.5 mg total) by mouth daily with breakfast. 30 tablet 5  . Lancets (FREESTYLE) lancets Use as instructed to check blood sugar once a day dx code E11.49 100 each 3  . lovastatin (MEVACOR) 20 MG tablet Take 20 mg by mouth daily.    . metFORMIN (GLUCOPHAGE) 1000 MG tablet TAKE ONE TABLET BY MOUTH AT BEDTIME 90 tablet 1  . Multiple Vitamins-Minerals (MULTIVITAMIN PO) Take 1 tablet by mouth daily.    . pramipexole (MIRAPEX) 0.25 MG tablet Take 0.25 mg by mouth daily.    . sertraline (ZOLOFT) 25 MG tablet Take 25 mg by mouth daily.    . vitamin B-12 (CYANOCOBALAMIN) 1000 MCG tablet Take 1,000 mcg by mouth daily.     No  current facility-administered medications for this visit.     Past Medical History  Diagnosis Date  . HTN (hypertension)   . Diabetes mellitus ([place])   . Hyperlipemia   . Glaucoma   . Anemia   . Cervical myelopathy ([facility])     s/p cervical fusion @ [facility]  . CKD (chronic kidney disease), stage III   . Decreased pedal pulses     normal ABI 02/03/13  . Hypothyroid     When pregnant  . Rheumatic fever     Past Surgical History  Procedure Laterality Date  . Neck surgery    . Tonsillectomy    . Vaginal hysterectomy    . Tubal ligation    . Appendectomy      Social History   Social History  . Marital Status: Married    Spouse Name: N/A  . Number of Children: 2  . Years of Education: N/A   Occupational History  . Not on file.   Social History Main Topics  . Smoking status: Never Smoker   . Smokeless tobacco: Never Used  . Alcohol Use: No  . Drug Use: Not on file  . Sexual Activity: Not on file   Other Topics Concern  . Not on file   Social History Narrative    Family History  Problem Relation Age of Onset  . Diabetes Mother   .  Cancer Mother     Colon  . Diabetes Sister   . Diabetes Paternal Grandmother     ROS: some back pain but no fevers or chills, productive cough, hemoptysis, dysphasia, odynophagia, melena, hematochezia, dysuria, hematuria, rash, seizure activity, orthopnea, PND, pedal edema, claudication. Remaining systems are negative.  Physical Exam: Well-developed obese in no acute distress.  Skin is warm and dry.  HEENT is normal.  Neck is supple.  Chest is clear to auscultation with normal expansion.  Cardiovascular exam is regular rate and rhythm. 2/6 systolic murmur LSB  Abdominal exam nontender or distended. No masses palpated. Extremities show no edema. neuro grossly intact   Assessment and plan 1 aortic stenosis-patient with moderate aortic stenosis. She will need follow-up echocardiograms in the future. I have explained that she  will likely require aortic valve replacement in the future. 2 hypertension-blood pressure controlled. Continue present medications. 3 Hyperlipidemia-Mild cerebrovascular disease documented. Discontinue Mevacor and begin crestor 20 mg daily. Check lipids and liver in 4 weeks. She did not tolerate Lipitor previously. 4 . Obesity-we discussed risk factor modification and weight loss  Kirk Ruths, MD

## 2016-04-12 ENCOUNTER — Encounter: Payer: Self-pay | Admitting: Cardiology

## 2016-04-12 ENCOUNTER — Ambulatory Visit (INDEPENDENT_AMBULATORY_CARE_PROVIDER_SITE_OTHER): Payer: PPO | Admitting: Cardiology

## 2016-04-12 ENCOUNTER — Other Ambulatory Visit: Payer: Self-pay | Admitting: Endocrinology

## 2016-04-12 VITALS — BP 130/72 | HR 82 | Ht 61.0 in | Wt 195.0 lb

## 2016-04-12 DIAGNOSIS — E785 Hyperlipidemia, unspecified: Secondary | ICD-10-CM

## 2016-04-12 DIAGNOSIS — I10 Essential (primary) hypertension: Secondary | ICD-10-CM

## 2016-04-12 DIAGNOSIS — I350 Nonrheumatic aortic (valve) stenosis: Secondary | ICD-10-CM

## 2016-04-12 MED ORDER — ROSUVASTATIN CALCIUM 20 MG PO TABS: 20.0000 mg | ORAL_TABLET | Freq: Every day | ORAL | Status: DC

## 2016-04-12 NOTE — Patient Instructions (Signed)
Medication Instructions:   STOP LOVASTATIN  START ROSUVASTATIN 20 MG ONCE DAILY  Labwork:  Your physician recommends that you return for lab work in: 4 WEEKS- DO NOT EAT PRIOR TO LAB WORK  Follow-Up:  Your physician wants you to follow-up in: 6 MONTHS WITH DR Jens SomRENSHAW You will receive a reminder letter in the mail two months in advance. If you don't receive a letter, please call our office to schedule the follow-up appointment.   If you need a refill on your cardiac medications before your next appointment, please call your pharmacy.

## 2016-05-01 ENCOUNTER — Other Ambulatory Visit (INDEPENDENT_AMBULATORY_CARE_PROVIDER_SITE_OTHER): Payer: PPO

## 2016-05-01 ENCOUNTER — Other Ambulatory Visit: Payer: Self-pay | Admitting: Endocrinology

## 2016-05-01 DIAGNOSIS — E119 Type 2 diabetes mellitus without complications: Secondary | ICD-10-CM

## 2016-05-01 LAB — COMPREHENSIVE METABOLIC PANEL WITH GFR
ALT: 15 U/L (ref 0–35)
AST: 18 U/L (ref 0–37)
Albumin: 3.8 g/dL (ref 3.5–5.2)
Alkaline Phosphatase: 66 U/L (ref 39–117)
BUN: 23 mg/dL (ref 6–23)
CO2: 32 meq/L (ref 19–32)
Calcium: 10 mg/dL (ref 8.4–10.5)
Chloride: 101 meq/L (ref 96–112)
Creatinine, Ser: 1.46 mg/dL — ABNORMAL HIGH (ref 0.40–1.20)
GFR: 37.65 mL/min — ABNORMAL LOW
Glucose, Bld: 119 mg/dL — ABNORMAL HIGH (ref 70–99)
Potassium: 4.8 meq/L (ref 3.5–5.1)
Sodium: 138 meq/L (ref 135–145)
Total Bilirubin: 0.4 mg/dL (ref 0.2–1.2)
Total Protein: 7.2 g/dL (ref 6.0–8.3)

## 2016-05-01 LAB — HEMOGLOBIN A1C: Hgb A1c MFr Bld: 6.4 % (ref 4.6–6.5)

## 2016-05-01 LAB — MICROALBUMIN / CREATININE URINE RATIO
Creatinine,U: 93.8 mg/dL
Microalb Creat Ratio: 10.5 mg/g (ref 0.0–30.0)
Microalb, Ur: 9.8 mg/dL — ABNORMAL HIGH (ref 0.0–1.9)

## 2016-05-04 ENCOUNTER — Ambulatory Visit (INDEPENDENT_AMBULATORY_CARE_PROVIDER_SITE_OTHER): Payer: PPO | Admitting: Endocrinology

## 2016-05-04 ENCOUNTER — Encounter: Payer: Self-pay | Admitting: Endocrinology

## 2016-05-04 VITALS — BP 140/74 | HR 83 | Wt 196.0 lb

## 2016-05-04 DIAGNOSIS — E119 Type 2 diabetes mellitus without complications: Secondary | ICD-10-CM

## 2016-05-04 DIAGNOSIS — N183 Chronic kidney disease, stage 3 unspecified: Secondary | ICD-10-CM

## 2016-05-04 NOTE — Patient Instructions (Addendum)
Stop Glipizide  Check blood sugars on waking up 2-3  times a week Also check blood sugars about 2 hours after a meal and do this after different meals by rotation  Recommended blood sugar levels on waking up is 90-130 and about 2 hours after meal is 130-160  Please bring your blood sugar monitor to each visit, thank you

## 2016-05-04 NOTE — Progress Notes (Signed)
Patient ID: Shannon Kidd, female   DOB: 1946-03-06, 70 y.o.   MRN: [id number]           Reason for Appointment: Followup for Type 2 Diabetes  Referring physician: Leighton Ruff  History of Present Illness:          Diagnosis: Type 2 diabetes mellitus, date of diagnosis: 2000        Past history: She used Metformin for her diabetes for several years until early 2015 and was taking 1g bid before it was stopped. She thinks her blood sugars are well controlled with this. Also at some point she was given Actos but this was stopped because of edema. Metformin was stopped in early 2015 on the suggestion of a nephrologist because of her renal dysfunction Since early 2015 she had been taking Januvia 50 mg daily When her blood sugars were not well controlled with A1c of 8% in 2015 she was started on low dose metformin to control fasting hyperglycemia  Recent history:   Since 11/2014 she has been taking 1000 mg of metformin in the evenings  A1c is now 6.4, previously 7%  Current management, blood sugar patterns and problems identified:  She  has been on glipizide ER instead of Januvia which she cannot afford  She continues to be following a healthier diet and is losing weight again  He has also tried to be active with some exercise regimen about 3 days a week  Blood sugars are only minimally higher at breakfast and suppertime but usually not high after evening meal  Lowest blood sugar has been 73 but rarely she may feel a little shaky   Hypoglycemia: Rarely      Oral hypoglycemic drugs the patient is taking are: Glipizide ER 2. 5 mg with 1000 mg metformin at bedtime      Side effects from medications have been: None Compliance with the medical regimen: Recently much improved    Glucose monitoring:  done 1-2 times a day         Glucometer:  Accu-Chek     Blood Glucose readings by time of day and averages from meter record  Mean values apply above for all meters except median for  One Touch  PRE-MEAL Fasting Lunch Dinner Bedtime Overall  Glucose range: 89-130   92-158  73-114    Mean/median: 106    90  107    Self-care:      Meals: 3 meals per day. Is eating out Frequently, Breakfast is egg, toast.      Exercise: walking/some exercise Bike 15 min, 2-4/7 days         Dietician visit: Most recent: 8/15.               Weight history: 195-245 Pounds previously  Wt Readings from Last 3 Encounters:  05/04/16 196 lb (88.9 kg)  04/12/16 195 lb (88.5 kg)  02/09/16 201 lb (91.2 kg)    Lab Results  Component Value Date   HGBA1C 6.4 05/01/2016   HGBA1C 7.0 (H) 01/03/2016   HGBA1C 6.6 (H) 10/04/2015   Lab Results  Component Value Date   MICROALBUR 9.8 (H) 05/01/2016   LDLCALC 69 10/04/2015   CREATININE 1.46 (H) 05/01/2016      Medication List       Accurate as of 05/04/16  9:18 AM. Always use your most recent med list.          amLODipine 5 MG tablet Commonly known as:  NORVASC TAKE ONE  TABLET BY MOUTH ONCE DAILY   aspirin 81 MG tablet Take 81 mg by mouth daily.   FREESTYLE FREEDOM LITE w/Device Kit Use to check blood sugar daily dx code E11.49   freestyle lancets Use as instructed to check blood sugar once a day dx code E11.49   FREESTYLE LITE test strip Generic drug:  glucose blood USE TO CHECK BLOOD SUGAR ONCE DAILY   glipiZIDE 2.5 MG 24 hr tablet Commonly known as:  GLUCOTROL XL TAKE ONE TABLET BY MOUTH ONCE DAILY WITH BREAKFAST   metFORMIN 1000 MG tablet Commonly known as:  GLUCOPHAGE TAKE ONE TABLET BY MOUTH AT BEDTIME   MULTIVITAMIN PO Take 1 tablet by mouth daily.   pramipexole 0.25 MG tablet Commonly known as:  MIRAPEX Take 0.25 mg by mouth daily.   rosuvastatin 20 MG tablet Commonly known as:  CRESTOR Take 1 tablet (20 mg total) by mouth daily.   sertraline 25 MG tablet Commonly known as:  ZOLOFT Take 25 mg by mouth daily.   vitamin B-12 1000 MCG tablet Commonly known as:  CYANOCOBALAMIN Take 1,000 mcg by mouth  daily.   vitamin C 1000 MG tablet Take 1,000 mg by mouth daily.   Vitamin D 2000 units tablet Take 2,000 Units by mouth daily.       Allergies:  Allergies  Allergen Reactions  . Actos [Pioglitazone]     Swelling     Past Medical History:  Diagnosis Date  . Anemia   . Cervical myelopathy ([facility])    s/p cervical fusion @ [facility]  . CKD (chronic kidney disease), stage III   . Decreased pedal pulses    normal ABI 02/03/13  . Diabetes mellitus ([place])   . Glaucoma   . HTN (hypertension)   . Hyperlipemia   . Hypothyroid    When pregnant  . Rheumatic fever     Past Surgical History:  Procedure Laterality Date  . APPENDECTOMY    . NECK SURGERY    . TONSILLECTOMY    . TUBAL LIGATION    . VAGINAL HYSTERECTOMY      Family History  Problem Relation Age of Onset  . Diabetes Mother   . Cancer Mother     Colon  . Diabetes Sister   . Diabetes Paternal Grandmother     Social History:  reports that she has never smoked. She has never used smokeless tobacco. She reports that she does not drink alcohol. Her drug history is not on file.    Review of Systems       Lipids: She has been treated with lovastatin low-dose with adequate control of LDL HDL tends to be low       Lab Results  Component Value Date   CHOL 129 10/04/2015   HDL 35.90 (L) 10/04/2015   LDLCALC 69 10/04/2015   TRIG 120.0 10/04/2015   CHOLHDL 4 10/04/2015                  The blood pressure has been controlled with Norvasc alone Previously was getting hyperkalemia and her lisinopril HCTZ was stopped         No history of Numbness, has tingling mostly in left along with some restless legs at night; relieved by Mirapex Diabetic foot exam done in 9/16  Chronic kidney disease of unclear etiology, followed by nephrologist annually, creatiine is fluctuating but about the same overall She saw the nephrologist in October   Lab Results  Component Value Date   CREATININE 1.46 (  H) 05/01/2016      LABS:  Lab on 05/01/2016  Component Date Value Ref Range Status  . Hgb A1c MFr Bld 05/01/2016 6.4  4.6 - 6.5 % Final  . Sodium 05/01/2016 138  135 - 145 mEq/L Final  . Potassium 05/01/2016 4.8  3.5 - 5.1 mEq/L Final  . Chloride 05/01/2016 101  96 - 112 mEq/L Final  . CO2 05/01/2016 32  19 - 32 mEq/L Final  . Glucose, Bld 05/01/2016 119* 70 - 99 mg/dL Final  . BUN 05/01/2016 23  6 - 23 mg/dL Final  . Creatinine, Ser 05/01/2016 1.46* 0.40 - 1.20 mg/dL Final  . Total Bilirubin 05/01/2016 0.4  0.2 - 1.2 mg/dL Final  . Alkaline Phosphatase 05/01/2016 66  39 - 117 U/L Final  . AST 05/01/2016 18  0 - 37 U/L Final  . ALT 05/01/2016 15  0 - 35 U/L Final  . Total Protein 05/01/2016 7.2  6.0 - 8.3 g/dL Final  . Albumin 05/01/2016 3.8  3.5 - 5.2 g/dL Final  . Calcium 05/01/2016 10.0  8.4 - 10.5 mg/dL Final  . GFR 05/01/2016 37.65* >60.00 mL/min Final  . Microalb, Ur 05/01/2016 9.8* 0.0 - 1.9 mg/dL Final  . Creatinine,U 05/01/2016 93.8  mg/dL Final  . Microalb Creat Ratio 05/01/2016 10.5  0.0 - 30.0 mg/g Final    Physical Examination:  BP 140/74 (BP Location: Left Arm, Patient Position: Sitting)   Pulse 83   Wt 196 lb (88.9 kg)   SpO2 96%   BMI 37.03 kg/m    ASSESSMENT:  Diabetes type 2 with obesity, BMI 87 See history of present illness for detailed discussion of current diabetes management, blood sugar patterns and problems identified   She is currently taking glipizide ER 5 mg with low dose metformin, 1000 mg at night Her blood sugars are overall well controlledAnd her A1c is down below 6.5 now She has continued to lose weight with excellent diet She is trying to exercise regularly Blood sugars are excellent at home However may occasionally feel a little hypoglycemic although lowest documented reading only 73  PLAN:     Continue metformin 1000 mg at night since she is tolerating very well and renal function is adequate for this dose   Continue the diet regimen with  low carbohydrates, portions and exercise  Stop glipizide for now and call if blood sugars are unusually high   Follow-up in 4 months Continue to follow renal function    Shannon Kidd 05/04/2016, 9:18 AM   Note: This office note was prepared with Estate agent. Any transcriptional errors that result from this process are unintentional.

## 2016-07-27 ENCOUNTER — Other Ambulatory Visit: Payer: Self-pay | Admitting: Endocrinology

## 2016-07-31 ENCOUNTER — Other Ambulatory Visit (INDEPENDENT_AMBULATORY_CARE_PROVIDER_SITE_OTHER): Payer: PPO

## 2016-07-31 DIAGNOSIS — E119 Type 2 diabetes mellitus without complications: Secondary | ICD-10-CM

## 2016-07-31 LAB — COMPREHENSIVE METABOLIC PANEL WITH GFR
ALT: 17 U/L (ref 0–35)
AST: 19 U/L (ref 0–37)
Albumin: 4 g/dL (ref 3.5–5.2)
Alkaline Phosphatase: 81 U/L (ref 39–117)
BUN: 25 mg/dL — ABNORMAL HIGH (ref 6–23)
CO2: 30 meq/L (ref 19–32)
Calcium: 10.1 mg/dL (ref 8.4–10.5)
Chloride: 100 meq/L (ref 96–112)
Creatinine, Ser: 1.37 mg/dL — ABNORMAL HIGH (ref 0.40–1.20)
GFR: 40.49 mL/min — ABNORMAL LOW
Glucose, Bld: 123 mg/dL — ABNORMAL HIGH (ref 70–99)
Potassium: 4.7 meq/L (ref 3.5–5.1)
Sodium: 137 meq/L (ref 135–145)
Total Bilirubin: 0.3 mg/dL (ref 0.2–1.2)
Total Protein: 7.5 g/dL (ref 6.0–8.3)

## 2016-07-31 LAB — LIPID PANEL
Cholesterol: 91 mg/dL (ref 0–200)
HDL: 32 mg/dL — ABNORMAL LOW
LDL Cholesterol: 31 mg/dL (ref 0–99)
NonHDL: 58.55
Total CHOL/HDL Ratio: 3
Triglycerides: 138 mg/dL (ref 0.0–149.0)
VLDL: 27.6 mg/dL (ref 0.0–40.0)

## 2016-07-31 LAB — HEMOGLOBIN A1C: Hgb A1c MFr Bld: 7.5 % — ABNORMAL HIGH (ref 4.6–6.5)

## 2016-08-03 ENCOUNTER — Encounter: Payer: Self-pay | Admitting: Endocrinology

## 2016-08-03 ENCOUNTER — Ambulatory Visit (INDEPENDENT_AMBULATORY_CARE_PROVIDER_SITE_OTHER): Payer: PPO | Admitting: Endocrinology

## 2016-08-03 VITALS — BP 128/78 | HR 76 | Ht 61.0 in | Wt 193.0 lb

## 2016-08-03 DIAGNOSIS — E1165 Type 2 diabetes mellitus with hyperglycemia: Secondary | ICD-10-CM | POA: Diagnosis not present

## 2016-08-03 NOTE — Progress Notes (Signed)
Patient ID: Shannon Kidd, female   DOB: 1945/12/07, 70 y.o.   MRN: [id number]           Reason for Appointment: Followup for Type 2 Diabetes  Referring physician: Leighton Ruff  History of Present Illness:          Diagnosis: Type 2 diabetes mellitus, date of diagnosis: 2000        Past history: She used Metformin for her diabetes for several years until early 2015 and was taking 1g bid before it was stopped. She thinks her blood sugars are well controlled with this. Also at some point she was given Actos but this was stopped because of edema. Metformin was stopped in early 2015 on the suggestion of a nephrologist because of her renal dysfunction Since early 2015 she had been taking Januvia 50 mg daily When her blood sugars were not well controlled with A1c of 8% in 2015 she was started on low dose metformin to control fasting hyperglycemia  Recent history:   Oral hypoglycemic drugs the patient is taking are: Glipizide ER 2. 5 mg with 1000 mg metformin at bedtime    A1c is now 7.5, previously 6.4  Current management, blood sugar patterns and problems identified:  She had an A1c of 6.4 and occasionally low normal readings on the last visit and was told to stop glipizide ER and continue metformin low  However despite her weight loss her blood sugars are not well controlled  She is not doing well on her diet with periodically eating fast food, traveling and attending but the parties  She has had readings as high as 280 postprandial.  She thinks this is from eating chips with dip  On her own last Sunday she started back on her glipizide 2.5 mg daily since sugars were running higher including fasting occasionally  Since then she has had somewhat better blood sugars and no hypoglycemia  He has also tried to be active with some exercise regimen about 3 days a week  Lowest blood sugar has been 74 but she has not felt shaky   Hypoglycemia: None recently         Side effects from  medications have been: None Compliance with the medical regimen: Recently variable  Glucose monitoring:  done 1-2 times a day         Glucometer:  Accu-Chek     Blood Glucose readings by time of day and averages from meter   Mean values apply above for all meters except median for One Touch  PRE-MEAL Fasting Lunch Dinner Bedtime Overall  Glucose range: 109-264   74-147  130-280    Mean/median: 150   104   145    Self-care:      Meals: 3 meals per day. Is eating out Frequently, Breakfast is egg, toast.      Exercise: walking/some exercise Bike 15 min, 2-4/7 days         Dietician visit: Most recent: 8/15.               Weight history: 195-245 Pounds previously  Wt Readings from Last 3 Encounters:  08/03/16 193 lb (87.5 kg)  05/04/16 196 lb (88.9 kg)  04/12/16 195 lb (88.5 kg)    Lab Results  Component Value Date   HGBA1C 7.5 (H) 07/31/2016   HGBA1C 6.4 05/01/2016   HGBA1C 7.0 (H) 01/03/2016   Lab Results  Component Value Date   MICROALBUR 9.8 (H) 05/01/2016   LDLCALC 31 07/31/2016  CREATININE 1.37 (H) 07/31/2016      Medication List       Accurate as of 08/03/16  9:29 AM. Always use your most recent med list.          amLODipine 5 MG tablet Commonly known as:  NORVASC TAKE ONE TABLET BY MOUTH ONCE DAILY   aspirin 81 MG tablet Take 81 mg by mouth daily.   FREESTYLE FREEDOM LITE w/Device Kit Use to check blood sugar daily dx code E11.49   freestyle lancets Use as instructed to check blood sugar once a day dx code E11.49   FREESTYLE LITE test strip Generic drug:  glucose blood USE TO CHECK BLOOD SUGAR ONCE DAILY   glipiZIDE 2.5 MG 24 hr tablet Commonly known as:  GLUCOTROL XL TAKE ONE TABLET BY MOUTH ONCE DAILY WITH BREAKFAST   metFORMIN 1000 MG tablet Commonly known as:  GLUCOPHAGE TAKE ONE TABLET BY MOUTH AT BEDTIME   MULTIVITAMIN PO Take 1 tablet by mouth daily.   pramipexole 0.25 MG tablet Commonly known as:  MIRAPEX Take 0.25 mg by  mouth daily.   rosuvastatin 20 MG tablet Commonly known as:  CRESTOR Take 1 tablet (20 mg total) by mouth daily.   sertraline 25 MG tablet Commonly known as:  ZOLOFT Take 25 mg by mouth daily.   vitamin B-12 1000 MCG tablet Commonly known as:  CYANOCOBALAMIN Take 1,000 mcg by mouth daily.   vitamin C 1000 MG tablet Take 1,000 mg by mouth daily.   Vitamin D 2000 units tablet Take 2,000 Units by mouth daily.       Allergies:  Allergies  Allergen Reactions  . Actos [Pioglitazone]     Swelling     Past Medical History:  Diagnosis Date  . Anemia   . Cervical myelopathy ([facility])    s/p cervical fusion @ [facility]  . CKD (chronic kidney disease), stage III   . Decreased pedal pulses    normal ABI 02/03/13  . Diabetes mellitus ([place])   . Glaucoma   . HTN (hypertension)   . Hyperlipemia   . Hypothyroid    When pregnant  . Rheumatic fever     Past Surgical History:  Procedure Laterality Date  . APPENDECTOMY    . NECK SURGERY    . TONSILLECTOMY    . TUBAL LIGATION    . VAGINAL HYSTERECTOMY      Family History  Problem Relation Age of Onset  . Diabetes Mother   . Cancer Mother     Colon  . Diabetes Sister   . Diabetes Paternal Grandmother     Social History:  reports that she has never smoked. She has never used smokeless tobacco. She reports that she does not drink alcohol. Her drug history is not on file.    Review of Systems       Lipids: She has been treated with Crestor more recently with LDL being rather low now Followed by cardiologist HDL tends to be low       Lab Results  Component Value Date   CHOL 91 07/31/2016   HDL 32.00 (L) 07/31/2016   LDLCALC 31 07/31/2016   TRIG 138.0 07/31/2016   CHOLHDL 3 07/31/2016                  The blood pressure has been controlled with Norvasc alone Previously was getting hyperkalemia and her lisinopril HCTZ was stopped         No history of Numbness, has Mild  tingling mostly in left along with some  restless legs at night; relieved by Mirapex Diabetic foot exam done in 07/2016 with normal findings  Chronic kidney disease of unclear etiology,Mild and followed by nephrologist annually Creatinine is slightly better on this visit   Lab Results  Component Value Date   CREATININE 1.37 (H) 07/31/2016   She is on vitamin D supplements followed by PCP   LABS:  Lab on 07/31/2016  Component Date Value Ref Range Status  . Hgb A1c MFr Bld 07/31/2016 7.5* 4.6 - 6.5 % Final  . Sodium 07/31/2016 137  135 - 145 mEq/L Final  . Potassium 07/31/2016 4.7  3.5 - 5.1 mEq/L Final  . Chloride 07/31/2016 100  96 - 112 mEq/L Final  . CO2 07/31/2016 30  19 - 32 mEq/L Final  . Glucose, Bld 07/31/2016 123* 70 - 99 mg/dL Final  . BUN 07/31/2016 25* 6 - 23 mg/dL Final  . Creatinine, Ser 07/31/2016 1.37* 0.40 - 1.20 mg/dL Final  . Total Bilirubin 07/31/2016 0.3  0.2 - 1.2 mg/dL Final  . Alkaline Phosphatase 07/31/2016 81  39 - 117 U/L Final  . AST 07/31/2016 19  0 - 37 U/L Final  . ALT 07/31/2016 17  0 - 35 U/L Final  . Total Protein 07/31/2016 7.5  6.0 - 8.3 g/dL Final  . Albumin 07/31/2016 4.0  3.5 - 5.2 g/dL Final  . Calcium 07/31/2016 10.1  8.4 - 10.5 mg/dL Final  . GFR 07/31/2016 40.49* >60.00 mL/min Final  . Cholesterol 07/31/2016 91  0 - 200 mg/dL Final  . Triglycerides 07/31/2016 138.0  0.0 - 149.0 mg/dL Final  . HDL 07/31/2016 32.00* >39.00 mg/dL Final  . VLDL 07/31/2016 27.6  0.0 - 40.0 mg/dL Final  . LDL Cholesterol 07/31/2016 31  0 - 99 mg/dL Final  . Total CHOL/HDL Ratio 07/31/2016 3   Final  . NonHDL 07/31/2016 58.55   Final    Physical Examination:  BP 128/78   Pulse 76   Ht 5' 1" (1.549 m)   Wt 193 lb (87.5 kg)   SpO2 96%   BMI 36.47 kg/m    ASSESSMENT:  Diabetes type 2 with obesity, BMI 87 See history of present illness for detailed discussion of current diabetes management, blood sugar patterns and problems identified   SheHas had overall higher A1c compared to her  last visit This is partly fair to inconsistent diet Also blood sugars maybe higher especially post prandial he with not taking glipizide that she was doing before She started back on glipizide on her own week ago and so far has not had tendency to hypoglycemia  She is trying to exercise and is able to keep her weight down  PLAN:     Continue metformin 1000 mg at night since creatinine is quite stable  She can continue glipizide ER for now  However consider changing to Amaryl 1 mg or less if she has tendency to low sugars before dinner  Encouraged her to be consistent with diet especially when eating out and getting fried food  Patient Instructions  Less fried food  Call if sugars < 70   Check blood sugars on waking up    Also check blood sugars about 2 hours after a meal and do this after different meals by rotation  Recommended blood sugar levels on waking up is 90-130 and about 2 hours after meal is 130-160  Please bring your blood sugar monitor to each visit, thank you  Violeta Lecount 08/03/2016, 9:29 AM   Note: This office note was prepared with Dragon voice recognition system technology. Any transcriptional errors that result from this process are unintentional.

## 2016-08-03 NOTE — Patient Instructions (Signed)
Less fried food  Call if sugars < 70   Check blood sugars on waking up    Also check blood sugars about 2 hours after a meal and do this after different meals by rotation  Recommended blood sugar levels on waking up is 90-130 and about 2 hours after meal is 130-160  Please bring your blood sugar monitor to each visit, thank you

## 2016-08-08 ENCOUNTER — Other Ambulatory Visit: Payer: Self-pay | Admitting: Endocrinology

## 2016-09-02 ENCOUNTER — Other Ambulatory Visit: Payer: Self-pay | Admitting: Endocrinology

## 2016-09-13 ENCOUNTER — Other Ambulatory Visit: Payer: Self-pay | Admitting: Endocrinology

## 2016-10-21 ENCOUNTER — Other Ambulatory Visit: Payer: Self-pay | Admitting: Endocrinology

## 2016-10-29 NOTE — Progress Notes (Signed)
HPI:  FU AS. Patient has a history of rheumatic fever by her report. Echocardiogram May 2017 showed normal LV systolic function, Grade 1 diastolic dysfunction, moderate aortic stenosis with mean gradient 32 mmHg, trace aortic insufficiency. Nuclear study May 2017 showed no ischemia or infarction. Ejection fraction 54%. Carotid Dopplers May 2017 showed 1-39% bilateral stenosis. Since last seen patient denies dyspnea or syncope. She has occasional pain in the left chest area. It can last an hour to all day. It does not radiate. Not pleuritic or positional. She does not have exertional chest pain.  Current Outpatient Prescriptions  Medication Sig Dispense Refill  . amLODipine (NORVASC) 5 MG tablet TAKE ONE TABLET BY MOUTH ONCE DAILY 30 tablet 5  . Ascorbic Acid (VITAMIN C) 1000 MG tablet Take 1,000 mg by mouth daily.    Marland Kitchen aspirin 81 MG tablet Take 81 mg by mouth daily.    . Blood Glucose Monitoring Suppl (FREESTYLE FREEDOM LITE) W/DEVICE KIT Use to check blood sugar daily dx code E11.49 1 each 0  . Cholecalciferol (VITAMIN D) 2000 UNITS tablet Take 2,000 Units by mouth daily.    Marland Kitchen FREESTYLE LITE test strip USE TO CHECK BLOOD SUGAR ONCE DAILY 100 each 0  . glipiZIDE (GLUCOTROL XL) 2.5 MG 24 hr tablet TAKE ONE TABLET BY MOUTH ONCE DAILY WITH BREAKFAST 30 tablet 0  . Lancets (FREESTYLE) lancets Use as instructed to check blood sugar once a day dx code E11.49 100 each 3  . metFORMIN (GLUCOPHAGE) 1000 MG tablet TAKE ONE TABLET BY MOUTH AT BEDTIME 90 tablet 1  . Multiple Vitamins-Minerals (MULTIVITAMIN PO) Take 1 tablet by mouth daily.    . pramipexole (MIRAPEX) 0.25 MG tablet Take 0.25 mg by mouth daily.    . rosuvastatin (CRESTOR) 20 MG tablet Take 1 tablet (20 mg total) by mouth daily. 30 tablet 12  . sertraline (ZOLOFT) 25 MG tablet Take 25 mg by mouth daily.    . vitamin B-12 (CYANOCOBALAMIN) 1000 MCG tablet Take 1,000 mcg by mouth daily.     No current facility-administered medications for  this visit.      Past Medical History:  Diagnosis Date  . Anemia   . Cervical myelopathy (HCC)    s/p cervical fusion @ [facility]  . CKD (chronic kidney disease), stage III   . Decreased pedal pulses    normal ABI 02/03/13  . Diabetes mellitus ([place])   . Glaucoma   . HTN (hypertension)   . Hyperlipemia   . Hypothyroid    When pregnant  . Rheumatic fever     Past Surgical History:  Procedure Laterality Date  . APPENDECTOMY    . NECK SURGERY    . TONSILLECTOMY    . TUBAL LIGATION    . VAGINAL HYSTERECTOMY      Social History   Social History  . Marital status: Married    Spouse name: N/A  . Number of children: 2  . Years of education: N/A   Occupational History  . Not on file.   Social History Main Topics  . Smoking status: Never Smoker  . Smokeless tobacco: Never Used  . Alcohol use No  . Drug use: Unknown  . Sexual activity: Not on file   Other Topics Concern  . Not on file   Social History Narrative  . No narrative on file    Family History  Problem Relation Age of Onset  . Diabetes Mother   . Cancer Mother  Colon  . Diabetes Sister   . Diabetes Paternal Grandmother     ROS: no fevers or chills, productive cough, hemoptysis, dysphasia, odynophagia, melena, hematochezia, dysuria, hematuria, rash, seizure activity, orthopnea, PND, pedal edema, claudication. Remaining systems are negative.  Physical Exam: Well-developed obese in no acute distress.  Skin is warm and dry.  HEENT is normal.  Neck is supple.  Chest is clear to auscultation with normal expansion.  Cardiovascular exam is regular rate and rhythm. 3/6 systolic murmur Abdominal exam nontender or distended. No masses palpated. Extremities show trace edema. neuro grossly intact  ECG-Sinus bradycardia at a rate of 59. No ST changes.  A/P  1 Aortic stenosis-plan follow-up echocardiogram May 2018. I have explained that she will likely require aortic valve replacement in the future.  She may be a candidate for TAVR. I will see her back immediately after her echo.  2 hypertension-blood pressure controlled. Continue present medications.  3 hyperlipidemia-continue statin.  4 obesity-we discussed the importance of weight loss.  5 chest pain-symptoms are atypical. She has had this intermittently for greater than 1 year. Previous nuclear study negative. Electrocardiogram shows no ST changes. She does not have exertional chest pain. I'm not convinced this is cardiac. We will consider further workup if her symptoms worsen.  Kirk Ruths, MD

## 2016-10-30 ENCOUNTER — Other Ambulatory Visit (INDEPENDENT_AMBULATORY_CARE_PROVIDER_SITE_OTHER): Payer: Medicare Other

## 2016-10-30 DIAGNOSIS — E1165 Type 2 diabetes mellitus with hyperglycemia: Secondary | ICD-10-CM

## 2016-10-30 LAB — COMPREHENSIVE METABOLIC PANEL WITH GFR
ALT: 19 U/L (ref 0–35)
AST: 20 U/L (ref 0–37)
Albumin: 4 g/dL (ref 3.5–5.2)
Alkaline Phosphatase: 83 U/L (ref 39–117)
BUN: 21 mg/dL (ref 6–23)
CO2: 31 meq/L (ref 19–32)
Calcium: 10 mg/dL (ref 8.4–10.5)
Chloride: 103 meq/L (ref 96–112)
Creatinine, Ser: 1.37 mg/dL — ABNORMAL HIGH (ref 0.40–1.20)
GFR: 40.46 mL/min — ABNORMAL LOW
Glucose, Bld: 110 mg/dL — ABNORMAL HIGH (ref 70–99)
Potassium: 5 meq/L (ref 3.5–5.1)
Sodium: 139 meq/L (ref 135–145)
Total Bilirubin: 0.3 mg/dL (ref 0.2–1.2)
Total Protein: 7.3 g/dL (ref 6.0–8.3)

## 2016-10-30 LAB — HEMOGLOBIN A1C: Hgb A1c MFr Bld: 6.8 % — ABNORMAL HIGH (ref 4.6–6.5)

## 2016-11-02 ENCOUNTER — Encounter: Payer: Self-pay | Admitting: Endocrinology

## 2016-11-02 ENCOUNTER — Ambulatory Visit (INDEPENDENT_AMBULATORY_CARE_PROVIDER_SITE_OTHER): Payer: Medicare Other | Admitting: Endocrinology

## 2016-11-02 VITALS — BP 136/78 | HR 60 | Ht 61.0 in | Wt 194.0 lb

## 2016-11-02 DIAGNOSIS — E119 Type 2 diabetes mellitus without complications: Secondary | ICD-10-CM | POA: Diagnosis not present

## 2016-11-02 NOTE — Progress Notes (Signed)
Patient ID: Shannon Kidd, female   DOB: 12/31/1945, 71 y.o.   MRN: [id number]           Reason for Appointment: Followup for Type 2 Diabetes  Referring physician: Leighton Ruff  History of Present Illness:          Diagnosis: Type 2 diabetes mellitus, date of diagnosis: 2000        Past history: She used Metformin for her diabetes for several years until early 2015 and was taking 1g bid before it was stopped. She thinks her blood sugars are well controlled with this. Also at some point she was given Actos but this was stopped because of edema. Metformin was stopped in early 2015 on the suggestion of a nephrologist because of her renal dysfunction Since early 2015 she had been taking Januvia 50 mg daily When her blood sugars were not well controlled with A1c of 8% in 2015 she was started on low dose metformin to control fasting hyperglycemia  Recent history:   Oral hypoglycemic drugs the patient is taking are: Glipizide ER 2. 5 mg with 1000 mg metformin at bedtime    A1c is now 6.8, previously 7.5  Current management, blood sugar patterns and problems identified:  She continues to take her glipizide ER 2.5 mg but is not getting any low sugars recently  Blood sugars are more consistent now in fairly close to normal throughout the day and she is checking sporadically at different times  She is also a little more active over the last month with moving  She thinks she is fairly consistent with diet also to the time  Low-dose metformin has been continued, has not had any change in renal function   Hypoglycemia: None          Side effects from medications have been: None Compliance with the medical regimen: Recently variable  Glucose monitoring:  done 1-2 times a day         Glucometer:  FreeStyle     Blood Glucose readings by time of day and averages from meter  Mean values apply above for all meters except median for One Touch  PRE-MEAL Fasting Lunch Dinner Bedtime Overall   Glucose range: 98-15  126  86-148  89, 118    Mean/median: 110   117  105  110    Self-care:      Meals: 3 meals per day. Is eating out Frequently, Breakfast is egg, toast.      Exercise: walking/some exercise Bike 15 min, 2-4/7 days         Dietician visit: Most recent: 8/15.               Weight history: 195-245 Pounds previously  Wt Readings from Last 3 Encounters:  11/02/16 194 lb (88 kg)  08/03/16 193 lb (87.5 kg)  05/04/16 196 lb (88.9 kg)    Lab Results  Component Value Date   HGBA1C 6.8 (H) 10/30/2016   HGBA1C 7.5 (H) 07/31/2016   HGBA1C 6.4 05/01/2016   Lab Results  Component Value Date   MICROALBUR 9.8 (H) 05/01/2016   LDLCALC 31 07/31/2016   CREATININE 1.37 (H) 10/30/2016    Allergies as of 11/02/2016      Reactions   Actos [pioglitazone]    Swelling      Medication List       Accurate as of 11/02/16  9:05 AM. Always use your most recent med list.  amLODipine 5 MG tablet Commonly known as:  NORVASC TAKE ONE TABLET BY MOUTH ONCE DAILY   aspirin 81 MG tablet Take 81 mg by mouth daily.   FREESTYLE FREEDOM LITE w/Device Kit Use to check blood sugar daily dx code E11.49   freestyle lancets Use as instructed to check blood sugar once a day dx code E11.49   FREESTYLE LITE test strip Generic drug:  glucose blood USE TO CHECK BLOOD SUGAR ONCE DAILY   glipiZIDE 2.5 MG 24 hr tablet Commonly known as:  GLUCOTROL XL TAKE ONE TABLET BY MOUTH ONCE DAILY WITH BREAKFAST   metFORMIN 1000 MG tablet Commonly known as:  GLUCOPHAGE TAKE ONE TABLET BY MOUTH AT BEDTIME   MULTIVITAMIN PO Take 1 tablet by mouth daily.   pramipexole 0.25 MG tablet Commonly known as:  MIRAPEX Take 0.25 mg by mouth daily.   rosuvastatin 20 MG tablet Commonly known as:  CRESTOR Take 1 tablet (20 mg total) by mouth daily.   sertraline 25 MG tablet Commonly known as:  ZOLOFT Take 25 mg by mouth daily.   vitamin B-12 1000 MCG tablet Commonly known as:   CYANOCOBALAMIN Take 1,000 mcg by mouth daily.   vitamin C 1000 MG tablet Take 1,000 mg by mouth daily.   Vitamin D 2000 units tablet Take 2,000 Units by mouth daily.       Allergies:  Allergies  Allergen Reactions  . Actos [Pioglitazone]     Swelling     Past Medical History:  Diagnosis Date  . Anemia   . Cervical myelopathy (HCC)    s/p cervical fusion @ [facility]  . CKD (chronic kidney disease), stage III   . Decreased pedal pulses    normal ABI 02/03/13  . Diabetes mellitus ([place])   . Glaucoma   . HTN (hypertension)   . Hyperlipemia   . Hypothyroid    When pregnant  . Rheumatic fever     Past Surgical History:  Procedure Laterality Date  . APPENDECTOMY    . NECK SURGERY    . TONSILLECTOMY    . TUBAL LIGATION    . VAGINAL HYSTERECTOMY      Family History  Problem Relation Age of Onset  . Diabetes Mother   . Cancer Mother     Colon  . Diabetes Sister   . Diabetes Paternal Grandmother     Social History:  reports that she has never smoked. She has never used smokeless tobacco. She reports that she does not drink alcohol. Her drug history is not on file.    Review of Systems       Lipids: She has been treated with Crestor  Followed by cardiologist HDL tends to be low but LDL is only 31       Lab Results  Component Value Date   CHOL 91 07/31/2016   HDL 32.00 (L) 07/31/2016   LDLCALC 31 07/31/2016   TRIG 138.0 07/31/2016   CHOLHDL 3 07/31/2016                  The blood pressure has been controlled with Norvasc alone Previously was getting hyperkalemia and her lisinopril HCTZ was stopped         No history of Numbness, has Mild tingling mostly in left along with some restless legs at night; relieved by Mirapex Diabetic foot exam done in 07/2016 with normal findings  Chronic kidney disease of unclear etiology, Mild and nephrologist and released her from his care Creatinine stable now  Lab Results  Component Value Date   CREATININE  1.37 (H) 10/30/2016   She is on vitamin D supplements followed by PCP   LABS:  Lab on 10/30/2016  Component Date Value Ref Range Status  . Hgb A1c MFr Bld 10/30/2016 6.8* 4.6 - 6.5 % Final  . Sodium 10/30/2016 139  135 - 145 mEq/L Final  . Potassium 10/30/2016 5.0  3.5 - 5.1 mEq/L Final  . Chloride 10/30/2016 103  96 - 112 mEq/L Final  . CO2 10/30/2016 31  19 - 32 mEq/L Final  . Glucose, Bld 10/30/2016 110* 70 - 99 mg/dL Final  . BUN 10/30/2016 21  6 - 23 mg/dL Final  . Creatinine, Ser 10/30/2016 1.37* 0.40 - 1.20 mg/dL Final  . Total Bilirubin 10/30/2016 0.3  0.2 - 1.2 mg/dL Final  . Alkaline Phosphatase 10/30/2016 83  39 - 117 U/L Final  . AST 10/30/2016 20  0 - 37 U/L Final  . ALT 10/30/2016 19  0 - 35 U/L Final  . Total Protein 10/30/2016 7.3  6.0 - 8.3 g/dL Final  . Albumin 10/30/2016 4.0  3.5 - 5.2 g/dL Final  . Calcium 10/30/2016 10.0  8.4 - 10.5 mg/dL Final  . GFR 10/30/2016 40.46* >60.00 mL/min Final    Physical Examination:  BP 136/78   Pulse 60   Ht 5' 1" (1.549 m)   Wt 194 lb (88 kg)   SpO2 96%   BMI 36.66 kg/m    ASSESSMENT:  Diabetes type 2 with obesity, BMI 87 See history of present illness for detailed discussion of current diabetes management, blood sugar patterns and problems identified  Blood sugars are excellent now with combination of low-dose metformin and low-dose glipizide ER Blood sugars are controlled even after meals No hypoglycemia She has been consistent with exercise and diet and weight is stable  She is trying to exercise and is able to keep her weight down  RENAL dysfunction: GFR is stable at 40.5  PLAN:     Continue metformin 1000 mg at night since creatinine is quite stable  She can continue glipizide ER for now  However consider changing to Amaryl 1 mg or less if she has tendency to low sugars before dinner  Encouraged her to be consistent with diet especially when eating out and getting fried food  There are no Patient  Instructions on file for this visit.   KUMAR,AJAY 11/02/2016, 9:05 AM   Note: This office note was prepared with Dragon voice recognition system technology. Any transcriptional errors that result from this process are unintentional.

## 2016-11-06 ENCOUNTER — Encounter: Payer: Self-pay | Admitting: Cardiology

## 2016-11-06 ENCOUNTER — Ambulatory Visit (INDEPENDENT_AMBULATORY_CARE_PROVIDER_SITE_OTHER): Payer: Medicare Other | Admitting: Cardiology

## 2016-11-06 VITALS — BP 142/72 | HR 58 | Ht 60.0 in | Wt 197.0 lb

## 2016-11-06 DIAGNOSIS — I10 Essential (primary) hypertension: Secondary | ICD-10-CM | POA: Diagnosis not present

## 2016-11-06 DIAGNOSIS — R072 Precordial pain: Secondary | ICD-10-CM

## 2016-11-06 DIAGNOSIS — I350 Nonrheumatic aortic (valve) stenosis: Secondary | ICD-10-CM | POA: Diagnosis not present

## 2016-11-06 NOTE — Patient Instructions (Signed)
Medication Instructions:   NO CHANGE  Testing/Procedures:  Your physician has requested that you have an echocardiogram. Echocardiography is a painless test that uses sound waves to create images of your heart. It provides your doctor with information about the size and shape of your heart and how well your heart's chambers and valves are working. This procedure takes approximately one hour. There are no restrictions for this procedure.    Follow-Up:  Your physician recommends that you schedule a follow-up appointment in: AFTER ECHOCARDIOGRAM

## 2016-11-21 ENCOUNTER — Other Ambulatory Visit: Payer: Self-pay | Admitting: Endocrinology

## 2017-01-29 ENCOUNTER — Other Ambulatory Visit: Payer: Self-pay | Admitting: Endocrinology

## 2017-01-31 NOTE — Progress Notes (Signed)
HPI: FU AS. Patient has a history of rheumatic fever by her report. Nuclear study May 2017 showed no ischemia or infarction. Ejection fraction 54%. Carotid Dopplers May 2017 showed 1-39% bilateral stenosis. Last echocardiogram 02/05/2017 showed normal LV systolic function, moderate to severe aortic stenosis with mean gradient 31 mmHg and mild left atrial enlargement.Since last seen she denies dyspnea, chest pain, palpitations or syncope.   Current Outpatient Prescriptions  Medication Sig Dispense Refill  . amLODipine (NORVASC) 5 MG tablet TAKE ONE TABLET BY MOUTH ONCE DAILY 30 tablet 5  . Ascorbic Acid (VITAMIN C) 1000 MG tablet Take 1,000 mg by mouth daily.    Marland Kitchen aspirin 81 MG tablet Take 81 mg by mouth daily.    . Blood Glucose Monitoring Suppl (FREESTYLE FREEDOM LITE) W/DEVICE KIT Use to check blood sugar daily dx code E11.49 1 each 0  . Cholecalciferol (VITAMIN D) 2000 UNITS tablet Take 2,000 Units by mouth daily.    Marland Kitchen FREESTYLE LITE test strip USE TO CHECK BLOOD SUGAR ONCE DAILY 100 each 0  . glipiZIDE (GLUCOTROL XL) 2.5 MG 24 hr tablet TAKE ONE TABLET BY MOUTH ONCE DAILY WITH BREAKFAST 90 tablet 0  . Lancets (FREESTYLE) lancets Use as instructed to check blood sugar once a day dx code E11.49 100 each 3  . metFORMIN (GLUCOPHAGE) 1000 MG tablet TAKE ONE TABLET BY MOUTH AT BEDTIME 90 tablet 1  . Multiple Vitamins-Minerals (MULTIVITAMIN PO) Take 1 tablet by mouth daily.    . pramipexole (MIRAPEX) 0.25 MG tablet Take 0.25 mg by mouth daily.    . rosuvastatin (CRESTOR) 20 MG tablet Take 1 tablet (20 mg total) by mouth daily. 30 tablet 12  . sertraline (ZOLOFT) 25 MG tablet Take 25 mg by mouth daily.    . vitamin B-12 (CYANOCOBALAMIN) 1000 MCG tablet Take 1,000 mcg by mouth daily.     No current facility-administered medications for this visit.      Past Medical History:  Diagnosis Date  . Anemia   . Cervical myelopathy (HCC)    s/p cervical fusion @ [facility]  . CKD (chronic  kidney disease), stage III   . Decreased pedal pulses    normal ABI 02/03/13  . Diabetes mellitus ([place])   . Glaucoma   . HTN (hypertension)   . Hyperlipemia   . Hypothyroid    When pregnant  . Rheumatic fever     Past Surgical History:  Procedure Laterality Date  . APPENDECTOMY    . NECK SURGERY    . TONSILLECTOMY    . TUBAL LIGATION    . VAGINAL HYSTERECTOMY      Social History   Social History  . Marital status: Married    Spouse name: N/A  . Number of children: 2  . Years of education: N/A   Occupational History  . Not on file.   Social History Main Topics  . Smoking status: Never Smoker  . Smokeless tobacco: Never Used  . Alcohol use No  . Drug use: Unknown  . Sexual activity: Not on file   Other Topics Concern  . Not on file   Social History Narrative  . No narrative on file    Family History  Problem Relation Age of Onset  . Diabetes Mother   . Cancer Mother     Colon  . Diabetes Sister   . Diabetes Paternal Grandmother     ROS: no fevers or chills, productive cough, hemoptysis, dysphasia, odynophagia, melena, hematochezia, dysuria,  hematuria, rash, seizure activity, orthopnea, PND, pedal edema, claudication. Remaining systems are negative.  Physical Exam: Well-developed obese in no acute distress.  Skin is warm and dry.  HEENT is normal.  Neck is supple.  Chest is clear to auscultation with normal expansion. No wheeze Cardiovascular exam is regular rate and rhythm. 3/6 systolic murmur Abdominal exam nontender or distended. No masses palpated. Extremities show no edema. neuro grossly intact   A/P  1 Aortic stenosis-moderate to severe on most recent echocardiogram. We will arrange a follow-up study May 2019. I have explained previously that she will require aortic valve replacement in the future. She may be a good candidate for TAVR. We also discussed his symptoms to be aware of including dyspnea, chest pain or syncope.  2  hypertension-blood pressure controlled. Continue present medications.  3 hyperlipidemia-continue statin.  4 obesity-we discussed the importance of weight loss.  5 chest pain-nuclear study showed no ischemia. No further symptoms.   Kirk Ruths, MD

## 2017-02-05 ENCOUNTER — Other Ambulatory Visit: Payer: Self-pay

## 2017-02-05 ENCOUNTER — Ambulatory Visit (HOSPITAL_COMMUNITY): Payer: Medicare Other | Attending: Cardiovascular Disease

## 2017-02-05 DIAGNOSIS — I129 Hypertensive chronic kidney disease with stage 1 through stage 4 chronic kidney disease, or unspecified chronic kidney disease: Secondary | ICD-10-CM | POA: Insufficient documentation

## 2017-02-05 DIAGNOSIS — N183 Chronic kidney disease, stage 3 (moderate): Secondary | ICD-10-CM | POA: Insufficient documentation

## 2017-02-05 DIAGNOSIS — E785 Hyperlipidemia, unspecified: Secondary | ICD-10-CM | POA: Insufficient documentation

## 2017-02-05 DIAGNOSIS — I350 Nonrheumatic aortic (valve) stenosis: Secondary | ICD-10-CM | POA: Insufficient documentation

## 2017-02-05 DIAGNOSIS — E1122 Type 2 diabetes mellitus with diabetic chronic kidney disease: Secondary | ICD-10-CM | POA: Diagnosis not present

## 2017-02-05 DIAGNOSIS — I083 Combined rheumatic disorders of mitral, aortic and tricuspid valves: Secondary | ICD-10-CM | POA: Insufficient documentation

## 2017-02-05 LAB — ECHOCARDIOGRAM COMPLETE
AO mean calculated velocity dopler: 258 cm/s
AV Area VTI index: 0.38 cm2/m2
AV Area VTI: 0.71 cm2
AV Area mean vel: 0.87 cm2
AV Mean grad: 31 mmHg
AV Peak grad: 68 mmHg
AV VEL mean LVOT/AV: 0.28
AV area mean vel ind: 0.47 cm2/m2
AV peak Index: 0.38
AV pk vel: 412 cm/s
AV vel: 0.7
Ao pk vel: 0.23 m/s
Ao-asc: 34 cm
Area-P 1/2: 3.44 cm2
E decel time: 218 ms
E/e' ratio: 17.41
FS: 21 % — AB (ref 28–44)
IVS/LV PW RATIO, ED: 1.1
LA ID, A-P, ES: 37 mm
LA diam end sys: 37 mm
LA diam index: 2 cm/m2
LA vol A4C: 40.6 mL
LA vol index: 21.7 mL/m2
LA vol: 40.2 mL
LV E/e' medial: 17.41
LV E/e'average: 17.41
LV PW d: 10 mm — AB (ref 0.6–1.1)
LV e' LATERAL: 6.09 cm/s
LVOT SV: 68 mL
LVOT VTI: 21.5 cm
LVOT area: 3.14 cm2
LVOT diameter: 20 mm
LVOT peak VTI: 0.22 cm
LVOT peak vel: 93.1 cm/s
Lateral S' vel: 14.7 cm/s
MV Dec: 218
MV Peak grad: 4 mmHg
MV pk A vel: 130 m/s
MV pk E vel: 106 m/s
P 1/2 time: 593 ms
P 1/2 time: 64 ms
TAPSE: 25.9 mm
TDI e' lateral: 6.09
TDI e' medial: 7.18
VTI: 96.1 cm
Valve area index: 0.38
Valve area: 0.7 cm2

## 2017-02-07 ENCOUNTER — Encounter: Payer: Self-pay | Admitting: Cardiology

## 2017-02-07 ENCOUNTER — Ambulatory Visit (INDEPENDENT_AMBULATORY_CARE_PROVIDER_SITE_OTHER): Payer: Medicare Other | Admitting: Cardiology

## 2017-02-07 VITALS — BP 120/70 | HR 72 | Ht 61.0 in | Wt 200.0 lb

## 2017-02-07 DIAGNOSIS — I350 Nonrheumatic aortic (valve) stenosis: Secondary | ICD-10-CM | POA: Diagnosis not present

## 2017-02-07 DIAGNOSIS — E7800 Pure hypercholesterolemia, unspecified: Secondary | ICD-10-CM

## 2017-02-07 DIAGNOSIS — I10 Essential (primary) hypertension: Secondary | ICD-10-CM | POA: Diagnosis not present

## 2017-02-07 NOTE — Patient Instructions (Signed)
Your physician wants you to follow-up in: 6 MONTHS WITH DR CRENSHAW You will receive a reminder letter in the mail two months in advance. If you don't receive a letter, please call our office to schedule the follow-up appointment.   If you need a refill on your cardiac medications before your next appointment, please call your pharmacy.  

## 2017-02-11 ENCOUNTER — Other Ambulatory Visit: Payer: Self-pay

## 2017-02-11 MED ORDER — GLUCOSE BLOOD VI STRP: ORAL_STRIP | 0 refills | Status: DC

## 2017-02-12 ENCOUNTER — Other Ambulatory Visit: Payer: Self-pay

## 2017-02-12 MED ORDER — GLUCOSE BLOOD VI STRP: ORAL_STRIP | 4 refills | Status: DC

## 2017-02-12 MED ORDER — ONETOUCH VERIO IQ SYSTEM W/DEVICE KIT: PACK | 2 refills | Status: AC

## 2017-02-12 MED ORDER — ONETOUCH DELICA LANCETS 33G MISC: 2 refills | Status: AC

## 2017-02-19 ENCOUNTER — Other Ambulatory Visit: Payer: Self-pay | Admitting: Endocrinology

## 2017-02-26 ENCOUNTER — Other Ambulatory Visit (INDEPENDENT_AMBULATORY_CARE_PROVIDER_SITE_OTHER): Payer: Medicare Other

## 2017-02-26 DIAGNOSIS — E119 Type 2 diabetes mellitus without complications: Secondary | ICD-10-CM

## 2017-02-26 LAB — COMPREHENSIVE METABOLIC PANEL WITH GFR
ALT: 18 U/L (ref 0–35)
AST: 20 U/L (ref 0–37)
Albumin: 3.9 g/dL (ref 3.5–5.2)
Alkaline Phosphatase: 71 U/L (ref 39–117)
BUN: 17 mg/dL (ref 6–23)
CO2: 31 meq/L (ref 19–32)
Calcium: 9.4 mg/dL (ref 8.4–10.5)
Chloride: 101 meq/L (ref 96–112)
Creatinine, Ser: 1.19 mg/dL (ref 0.40–1.20)
GFR: 47.56 mL/min — ABNORMAL LOW
Glucose, Bld: 128 mg/dL — ABNORMAL HIGH (ref 70–99)
Potassium: 4.8 meq/L (ref 3.5–5.1)
Sodium: 138 meq/L (ref 135–145)
Total Bilirubin: 0.5 mg/dL (ref 0.2–1.2)
Total Protein: 7.1 g/dL (ref 6.0–8.3)

## 2017-02-26 LAB — MICROALBUMIN / CREATININE URINE RATIO
Creatinine,U: 62.7 mg/dL
Microalb Creat Ratio: 11.5 mg/g (ref 0.0–30.0)
Microalb, Ur: 7.2 mg/dL — ABNORMAL HIGH (ref 0.0–1.9)

## 2017-02-26 LAB — HEMOGLOBIN A1C: Hgb A1c MFr Bld: 7.2 % — ABNORMAL HIGH (ref 4.6–6.5)

## 2017-03-01 ENCOUNTER — Encounter: Payer: Self-pay | Admitting: Endocrinology

## 2017-03-01 ENCOUNTER — Ambulatory Visit (INDEPENDENT_AMBULATORY_CARE_PROVIDER_SITE_OTHER): Payer: Medicare Other | Admitting: Endocrinology

## 2017-03-01 VITALS — BP 120/64 | HR 57 | Ht 60.6 in | Wt 193.6 lb

## 2017-03-01 DIAGNOSIS — E1165 Type 2 diabetes mellitus with hyperglycemia: Secondary | ICD-10-CM

## 2017-03-01 MED ORDER — METFORMIN HCL ER 500 MG PO TB24: 1000.0000 mg | ORAL_TABLET | Freq: Every day | ORAL | 3 refills | Status: DC

## 2017-03-01 NOTE — Progress Notes (Signed)
Patient ID: Shannon Kidd, female   DOB: [birth year], 71 y.o.   MRN: [id number]           Reason for Appointment: Followup for Type 2 Diabetes  Referring physician: Leighton Ruff  History of Present Illness:          Diagnosis: Type 2 diabetes mellitus, date of diagnosis: 2000        Past history: She used Metformin for her diabetes for several years until early 2015 and was taking 1g bid before it was stopped. She thinks her blood sugars are well controlled with this. Also at some point she was given Actos but this was stopped because of edema. Metformin was stopped in early 2015 on the suggestion of a nephrologist because of her renal dysfunction Since early 2015 she had been taking Januvia 50 mg daily When her blood sugars were not well controlled with A1c of 8% in 2015 she was started on low dose metformin to control fasting hyperglycemia  Recent history:   Oral hypoglycemic drugs the patient is taking are: Glipizide ER 2. 5 mg with 1000 mg metformin at bedtime    A1c is now 7.2, previously 6.8  Current management, blood sugar patterns and problems identified:  She thinks that her blood sugars are not as well-controlled because of stress from her son's illness, having to eat out of the hospital frequently and she tends to have some high postprandial readings  She has however lost 4 pounds  She thinks she is trying to walk when she can especially around the hospital  Fasting readings are consistently mildly increased  She now says that with her metformin she will occasionally have diarrhea but has not discussed this before  Lowest blood sugar 93 in the afternoon, no hypoglycemia with glipizide  Highest blood sugar is 209 after supper   Hypoglycemia: None          Side effects from medications have been: None Compliance with the medical regimen: Recently variable  Glucose monitoring:  done 1-2 times a day         Glucometer: Verio      Blood Glucose readings by time  of day and averages from meter  Mean values apply above for all meters except median for One Touch  PRE-MEAL Fasting Lunch Dinner Bedtime Overall  Glucose range: 109-147   112-209 93-2 09  Mean/median: 124  118 164 128     Self-care:      Meals: 3 meals per day. Is eating out Frequently, Breakfast is egg, toast.      Exercise: walking      Dietician visit: Most recent: 8/15.               Weight history: 195-245 Pounds previously  Wt Readings from Last 3 Encounters:  03/01/17 193 lb 9.6 oz (87.8 kg)  02/07/17 200 lb (90.7 kg)  11/06/16 197 lb (89.4 kg)    Lab Results  Component Value Date   HGBA1C 7.2 (H) 02/26/2017   HGBA1C 6.8 (H) 10/30/2016   HGBA1C 7.5 (H) 07/31/2016   Lab Results  Component Value Date   MICROALBUR 7.2 (H) 02/26/2017   LDLCALC 31 07/31/2016   CREATININE 1.19 02/26/2017    Allergies as of 03/01/2017      Reactions   Actos [pioglitazone]    Swelling      Medication List       Accurate as of 03/01/17 10:22 AM. Always use your most recent med list.  amLODipine 5 MG tablet Commonly known as:  NORVASC TAKE ONE TABLET BY MOUTH ONCE DAILY   aspirin 81 MG tablet Take 81 mg by mouth daily.   glipiZIDE 2.5 MG 24 hr tablet Commonly known as:  GLUCOTROL XL TAKE 1 TABLET BY MOUTH ONCE DAILY WITH BREAKFAST   glucose blood test strip Commonly known as:  ONETOUCH VERIO USE TO CHECK BLOOD SUGAR ONCE DAILY Dx code E11.9   metFORMIN 500 MG 24 hr tablet Commonly known as:  GLUCOPHAGE-XR Take 2 tablets (1,000 mg total) by mouth daily with supper.   MULTIVITAMIN PO Take 1 tablet by mouth daily.   ONETOUCH DELICA LANCETS 53G Misc USE TO CHECK BLOOD SUGAR ONCE DAILY Dx code E11.9   ONETOUCH VERIO IQ SYSTEM w/Device Kit USE TO CHECK BLOOD SUGAR ONCE DAILY Dx code E11.9   pramipexole 0.25 MG tablet Commonly known as:  MIRAPEX Take 0.25 mg by mouth daily.   rosuvastatin 20 MG tablet Commonly known as:  CRESTOR Take 1 tablet (20 mg  total) by mouth daily.   sertraline 25 MG tablet Commonly known as:  ZOLOFT Take 25 mg by mouth daily.   vitamin B-12 1000 MCG tablet Commonly known as:  CYANOCOBALAMIN Take 1,000 mcg by mouth daily.   vitamin C 1000 MG tablet Take 1,000 mg by mouth daily.   Vitamin D 2000 units tablet Take 2,000 Units by mouth daily.       Allergies:  Allergies  Allergen Reactions  . Actos [Pioglitazone]     Swelling     Past Medical History:  Diagnosis Date  . Anemia   . Cervical myelopathy (HCC)    s/p cervical fusion @ [facility]  . CKD (chronic kidney disease), stage III   . Decreased pedal pulses    normal ABI 02/03/13  . Diabetes mellitus ([place])   . Glaucoma   . HTN (hypertension)   . Hyperlipemia   . Hypothyroid    When pregnant  . Rheumatic fever     Past Surgical History:  Procedure Laterality Date  . APPENDECTOMY    . NECK SURGERY    . TONSILLECTOMY    . TUBAL LIGATION    . VAGINAL HYSTERECTOMY      Family History  Problem Relation Age of Onset  . Diabetes Mother   . Cancer Mother        Colon  . Diabetes Sister   . Diabetes Paternal Grandmother     Social History:  reports that she has never smoked. She has never used smokeless tobacco. She reports that she does not drink alcohol. Her drug history is not on file.    Review of Systems       Lipids: She has been treated with Crestor  Followed by cardiologist HDL tends to be low but LDL is Also relatively low       Lab Results  Component Value Date   CHOL 91 07/31/2016   HDL 32.00 (L) 07/31/2016   LDLCALC 31 07/31/2016   TRIG 138.0 07/31/2016   CHOLHDL 3 07/31/2016                  The blood pressure has been controlled with Norvasc alone Previously was getting hyperkalemia With her lisinopril HCTZ and this was stopped      Diabetic foot exam done in 07/2016 with normal findings  Chronic kidney disease is improved, not followed by nephrologist now    Lab Results  Component Value Date  CREATININE 1.19 02/26/2017   She is on vitamin D supplements followed by PCP   LABS:  Lab on 02/26/2017  Component Date Value Ref Range Status  . Hgb A1c MFr Bld 02/26/2017 7.2* 4.6 - 6.5 % Final   Glycemic Control Guidelines for People with Diabetes:Non Diabetic:  <6%Goal of Therapy: <7%Additional Action Suggested:  >8%   . Sodium 02/26/2017 138  135 - 145 mEq/L Final  . Potassium 02/26/2017 4.8  3.5 - 5.1 mEq/L Final  . Chloride 02/26/2017 101  96 - 112 mEq/L Final  . CO2 02/26/2017 31  19 - 32 mEq/L Final  . Glucose, Bld 02/26/2017 128* 70 - 99 mg/dL Final  . BUN 02/26/2017 17  6 - 23 mg/dL Final  . Creatinine, Ser 02/26/2017 1.19  0.40 - 1.20 mg/dL Final  . Total Bilirubin 02/26/2017 0.5  0.2 - 1.2 mg/dL Final  . Alkaline Phosphatase 02/26/2017 71  39 - 117 U/L Final  . AST 02/26/2017 20  0 - 37 U/L Final  . ALT 02/26/2017 18  0 - 35 U/L Final  . Total Protein 02/26/2017 7.1  6.0 - 8.3 g/dL Final  . Albumin 02/26/2017 3.9  3.5 - 5.2 g/dL Final  . Calcium 02/26/2017 9.4  8.4 - 10.5 mg/dL Final  . GFR 02/26/2017 47.56* >60.00 mL/min Final  . Microalb, Ur 02/26/2017 7.2* 0.0 - 1.9 mg/dL Final  . Creatinine,U 02/26/2017 62.7  mg/dL Final  . Microalb Creat Ratio 02/26/2017 11.5  0.0 - 30.0 mg/g Final    Physical Examination:  BP 120/64   Pulse (!) 57   Ht 5' 0.6" (1.539 m)   Wt 193 lb 9.6 oz (87.8 kg)   SpO2 97%   BMI 37.06 kg/m    ASSESSMENT:  Diabetes type 2 with obesity, BMI 87 See history of present illness for detailed discussion of current diabetes management, blood sugar patterns and problems identified  Blood sugars are Relatively higher than before as discussed above Most likely this is related to her personal family situation and hopefully is temporary She is on once a day regular metformin which may occasionally causes diarrhea  RENAL dysfunction: GFR is Improved at 48 and creatinine is back to normal No microalbuminuria and she can continue amlodipine  for hypertension which is working well  PLAN:   She will change her metformin to metformin ER 1000 mg daily for better tolerability and consider increasing the dose on the next visit if she still has relatively high readings  She will try to improve her diet as much is possible  More readings after meals  Patient Instructions  Check blood sugars on waking up  3/7 days  Also check blood sugars about 2 hours after a meal and do this after different meals by rotation  Recommended blood sugar levels on waking up is 90-130 and about 2 hours after meal is 130-160  Please bring your blood sugar monitor to each visit, thank you     [facility] 03/01/2017, 10:22 AM   Note: This office note was prepared with Dragon voice recognition system technology. Any transcriptional errors that result from this process are unintentional.

## 2017-03-01 NOTE — Patient Instructions (Signed)
Check blood sugars on waking up  3/7 days  Also check blood sugars about 2 hours after a meal and do this after different meals by rotation  Recommended blood sugar levels on waking up is 90-130 and about 2 hours after meal is 130-160  Please bring your blood sugar monitor to each visit, thank you   

## 2017-03-11 ENCOUNTER — Other Ambulatory Visit: Payer: Self-pay | Admitting: Endocrinology

## 2017-04-20 ENCOUNTER — Other Ambulatory Visit: Payer: Self-pay | Admitting: Cardiology

## 2017-04-20 DIAGNOSIS — E785 Hyperlipidemia, unspecified: Secondary | ICD-10-CM

## 2017-04-22 ENCOUNTER — Other Ambulatory Visit: Payer: Self-pay

## 2017-04-22 MED ORDER — ROSUVASTATIN CALCIUM 20 MG PO TABS: 20.0000 mg | ORAL_TABLET | Freq: Every day | ORAL | 3 refills | Status: DC

## 2017-05-21 ENCOUNTER — Other Ambulatory Visit: Payer: Self-pay | Admitting: Endocrinology

## 2017-05-28 ENCOUNTER — Other Ambulatory Visit (INDEPENDENT_AMBULATORY_CARE_PROVIDER_SITE_OTHER): Payer: Medicare Other

## 2017-05-28 DIAGNOSIS — E1165 Type 2 diabetes mellitus with hyperglycemia: Secondary | ICD-10-CM | POA: Diagnosis not present

## 2017-05-28 LAB — HEMOGLOBIN A1C: Hgb A1c MFr Bld: 6.9 % — ABNORMAL HIGH (ref 4.6–6.5)

## 2017-05-28 LAB — LIPID PANEL
Cholesterol: 78 mg/dL (ref 0–200)
HDL: 32.3 mg/dL — ABNORMAL LOW
LDL Cholesterol: 24 mg/dL (ref 0–99)
NonHDL: 45.33
Total CHOL/HDL Ratio: 2
Triglycerides: 109 mg/dL (ref 0.0–149.0)
VLDL: 21.8 mg/dL (ref 0.0–40.0)

## 2017-05-28 LAB — COMPREHENSIVE METABOLIC PANEL WITH GFR
ALT: 15 U/L (ref 0–35)
AST: 20 U/L (ref 0–37)
Albumin: 3.8 g/dL (ref 3.5–5.2)
Alkaline Phosphatase: 76 U/L (ref 39–117)
BUN: 29 mg/dL — ABNORMAL HIGH (ref 6–23)
CO2: 28 meq/L (ref 19–32)
Calcium: 9.7 mg/dL (ref 8.4–10.5)
Chloride: 103 meq/L (ref 96–112)
Creatinine, Ser: 1.53 mg/dL — ABNORMAL HIGH (ref 0.40–1.20)
GFR: 35.56 mL/min — ABNORMAL LOW
Glucose, Bld: 116 mg/dL — ABNORMAL HIGH (ref 70–99)
Potassium: 5.1 meq/L (ref 3.5–5.1)
Sodium: 138 meq/L (ref 135–145)
Total Bilirubin: 0.4 mg/dL (ref 0.2–1.2)
Total Protein: 7.4 g/dL (ref 6.0–8.3)

## 2017-05-30 ENCOUNTER — Ambulatory Visit (INDEPENDENT_AMBULATORY_CARE_PROVIDER_SITE_OTHER): Payer: Medicare Other | Admitting: Endocrinology

## 2017-05-30 ENCOUNTER — Encounter: Payer: Self-pay | Admitting: Endocrinology

## 2017-05-30 VITALS — BP 138/76 | HR 54 | Ht 60.0 in | Wt 195.0 lb

## 2017-05-30 DIAGNOSIS — E782 Mixed hyperlipidemia: Secondary | ICD-10-CM | POA: Diagnosis not present

## 2017-05-30 DIAGNOSIS — E1165 Type 2 diabetes mellitus with hyperglycemia: Secondary | ICD-10-CM | POA: Diagnosis not present

## 2017-05-30 DIAGNOSIS — I10 Essential (primary) hypertension: Secondary | ICD-10-CM | POA: Diagnosis not present

## 2017-05-30 DIAGNOSIS — N289 Disorder of kidney and ureter, unspecified: Secondary | ICD-10-CM | POA: Diagnosis not present

## 2017-05-30 NOTE — Progress Notes (Signed)
Patient ID: Shannon Kidd, female   DOB: 01/19/1946, 71 y.o.   MRN: [id number]           Reason for Appointment: Followup for Type 2 Diabetes  Referring physician: Leighton Ruff  History of Present Illness:          Diagnosis: Type 2 diabetes mellitus, date of diagnosis: 2000        Past history: She used Metformin for her diabetes for several years until early 2015 and was taking 1g bid before it was stopped. She thinks her blood sugars are well controlled with this. Also at some point she was given Actos but this was stopped because of edema. Metformin was stopped in early 2015 on the suggestion of a nephrologist because of her renal dysfunction Since early 2015 she had been taking Januvia 50 mg daily When her blood sugars were not well controlled with A1c of 8% in 2015 she was started on low dose metformin to control fasting hyperglycemia  Recent history:   Oral hypoglycemic drugs the patient is taking are: Glipizide ER 2. 5 mg with 1000 mg metformin ER at bedtime    A1c is now 6.9, previously 7.2  Current management, blood sugar patterns and problems identified:  She again tends to have relatively high fasting readings even with taking metformin 1000 mg  She is taking extended-release metformin for better tolerability and fasting readings are higher because of this  Despite trying to work with her diet and exercise regimen she has gained back 10 pounds  She has only a few postprandial readings and they do not seem to be high  She thinks she is trying to walk more regularly  Lowest blood sugar 97 in the afternoon, no hypoglycemia with glipizide   Hypoglycemia: None          Side effects from medications have been: None Compliance with the medical regimen: Recently variable  Glucose monitoring:  done 1-2 times a day         Glucometer: Verio      Blood Glucose readings by time of day and averages from meter  FASTING range 1 07-1 67 Nonfasting 97-1 51 MEDIAN 134  with median in the morning and 134 also   Self-care:      Meals: 3 meals per day. Is eating out less, Breakfast is egg, toast.      Exercise: walking upto 20 min      Dietician visit: Most recent: 8/15.               Weight history: 195-245 Pounds previously  Wt Readings from Last 3 Encounters:  05/30/17 195 lb (88.5 kg)  03/01/17 193 lb 9.6 oz (87.8 kg)  02/07/17 200 lb (90.7 kg)    Lab Results  Component Value Date   HGBA1C 6.9 (H) 05/28/2017   HGBA1C 7.2 (H) 02/26/2017   HGBA1C 6.8 (H) 10/30/2016   Lab Results  Component Value Date   MICROALBUR 7.2 (H) 02/26/2017   LDLCALC 24 05/28/2017   CREATININE 1.53 (H) 05/28/2017    Allergies as of 05/30/2017      Reactions   Actos [pioglitazone]    Swelling      Medication List       Accurate as of 05/30/17 10:39 AM. Always use your most recent med list.          amLODipine 5 MG tablet Commonly known as:  NORVASC TAKE ONE TABLET BY MOUTH ONCE DAILY   aspirin 81 MG tablet  Take 81 mg by mouth daily.   glipiZIDE 2.5 MG 24 hr tablet Commonly known as:  GLUCOTROL XL TAKE 1 TABLET BY MOUTH ONCE DAILY WITH BREAKFAST   glucose blood test strip Commonly known as:  ONETOUCH VERIO USE TO CHECK BLOOD SUGAR ONCE DAILY Dx code E11.9   metFORMIN 500 MG 24 hr tablet Commonly known as:  GLUCOPHAGE-XR Take 2 tablets (1,000 mg total) by mouth daily with supper.   MULTIVITAMIN PO Take 1 tablet by mouth daily.   ONETOUCH DELICA LANCETS 83K Misc USE TO CHECK BLOOD SUGAR ONCE DAILY Dx code E11.9   ONETOUCH VERIO IQ SYSTEM w/Device Kit USE TO CHECK BLOOD SUGAR ONCE DAILY Dx code E11.9   pramipexole 0.25 MG tablet Commonly known as:  MIRAPEX Take 0.25 mg by mouth daily.   rosuvastatin 20 MG tablet Commonly known as:  CRESTOR Take 1 tablet (20 mg total) by mouth daily.   sertraline 25 MG tablet Commonly known as:  ZOLOFT Take 25 mg by mouth daily.   vitamin B-12 1000 MCG tablet Commonly known as:   CYANOCOBALAMIN Take 1,000 mcg by mouth daily.   vitamin C 1000 MG tablet Take 1,000 mg by mouth daily.   Vitamin D 2000 units tablet Take 2,000 Units by mouth daily.       Allergies:  Allergies  Allergen Reactions  . Actos [Pioglitazone]     Swelling     Past Medical History:  Diagnosis Date  . Anemia   . Cervical myelopathy (HCC)    s/p cervical fusion @ [facility]  . CKD (chronic kidney disease), stage III   . Decreased pedal pulses    normal ABI 02/03/13  . Diabetes mellitus ([place])   . Glaucoma   . HTN (hypertension)   . Hyperlipemia   . Hypothyroid    When pregnant  . Rheumatic fever     Past Surgical History:  Procedure Laterality Date  . APPENDECTOMY    . NECK SURGERY    . TONSILLECTOMY    . TUBAL LIGATION    . VAGINAL HYSTERECTOMY      Family History  Problem Relation Age of Onset  . Diabetes Mother   . Cancer Mother        Colon  . Diabetes Sister   . Diabetes Paternal Grandmother     Social History:  reports that she has never smoked. She has never used smokeless tobacco. She reports that she does not drink alcohol. Her drug history is not on file.    Review of Systems       Lipids: She has been treated with Crestor 20 mg Followed by cardiologist HDL tends to be low        Lab Results  Component Value Date   CHOL 78 05/28/2017   HDL 32.30 (L) 05/28/2017   LDLCALC 24 05/28/2017   TRIG 109.0 05/28/2017   CHOLHDL 2 05/28/2017                  The blood pressure has been controlled with Norvasc alone Previously was getting hyperkalemia With her lisinopril HCTZ and this was stopped      Diabetic foot exam done in 07/2016 with normal findings   Although her creatinine had come down to 1.19 on her last visit dated is higher for no apparent reason Has taken an occasional ibuprofen for shoulder but not daily  Lab Results  Component Value Date   CREATININE 1.53 (H) 05/28/2017   She is on vitamin  D supplements followed by  PCP   LABS:  Lab on 05/28/2017  Component Date Value Ref Range Status  . Hgb A1c MFr Bld 05/28/2017 6.9* 4.6 - 6.5 % Final   Glycemic Control Guidelines for People with Diabetes:Non Diabetic:  <6%Goal of Therapy: <7%Additional Action Suggested:  >8%   . Sodium 05/28/2017 138  135 - 145 mEq/L Final  . Potassium 05/28/2017 5.1  3.5 - 5.1 mEq/L Final  . Chloride 05/28/2017 103  96 - 112 mEq/L Final  . CO2 05/28/2017 28  19 - 32 mEq/L Final  . Glucose, Bld 05/28/2017 116* 70 - 99 mg/dL Final  . BUN 05/28/2017 29* 6 - 23 mg/dL Final  . Creatinine, Ser 05/28/2017 1.53* 0.40 - 1.20 mg/dL Final  . Total Bilirubin 05/28/2017 0.4  0.2 - 1.2 mg/dL Final  . Alkaline Phosphatase 05/28/2017 76  39 - 117 U/L Final  . AST 05/28/2017 20  0 - 37 U/L Final  . ALT 05/28/2017 15  0 - 35 U/L Final  . Total Protein 05/28/2017 7.4  6.0 - 8.3 g/dL Final  . Albumin 05/28/2017 3.8  3.5 - 5.2 g/dL Final  . Calcium 05/28/2017 9.7  8.4 - 10.5 mg/dL Final  . GFR 05/28/2017 35.56* >60.00 mL/min Final  . Cholesterol 05/28/2017 78  0 - 200 mg/dL Final   ATP III Classification       Desirable:  < 200 mg/dL               Borderline High:  200 - 239 mg/dL          High:  > = 240 mg/dL  . Triglycerides 05/28/2017 109.0  0.0 - 149.0 mg/dL Final   Normal:  <150 mg/dLBorderline High:  150 - 199 mg/dL  . HDL 05/28/2017 32.30* >39.00 mg/dL Final  . VLDL 05/28/2017 21.8  0.0 - 40.0 mg/dL Final  . LDL Cholesterol 05/28/2017 24  0 - 99 mg/dL Final  . Total CHOL/HDL Ratio 05/28/2017 2   Final                  Men          Women1/2 Average Risk     3.4          3.3Average Risk          5.0          4.42X Average Risk          9.6          7.13X Average Risk          15.0          11.0                      . NonHDL 05/28/2017 45.33   Final   NOTE:  Non-HDL goal should be 30 mg/dL higher than patient's LDL goal (i.e. LDL goal of < 70 mg/dL, would have non-HDL goal of < 100 mg/dL)    Physical Examination:  BP 138/76 (BP  Location: Left Arm, Cuff Size: Large)   Pulse (!) 54   Ht 5' (1.524 m)   Wt 195 lb (88.5 kg)   SpO2 97%   BMI 38.08 kg/m   Blood pressure when checked by myself was 160 on both arms but subsequently blood pressure was improved after several minutes of rest  No ankle edema  ASSESSMENT:  Diabetes type 2 with obesity, BMI 87 See history of present illness for detailed  discussion of current diabetes management, blood sugar patterns and problems identified  Blood sugars are reasonably well controlled with A1c 6.9 She tends to have relatively higher fasting readings especially recently and may be from switching to extended release metformin Considering her age her blood sugar control is adequate  RENAL dysfunction: GFR is lower at 35, previously 48 Although she has taken occasional ibuprofen is unlikely to be worsening her renal function She will need to follow-up with her nephrologist Again has no microalbuminuria  HYPERTENSION: Blood pressure appears to be fairly well controlled although higher at one time during the office visit because of anxiety about her renal function  Hyperlipidemia: She does not have CAD and not clear if she needs to be on high intensity statin, defer to cardiologist  PLAN:   She will continue her metformin unchanged at the lower dose which should be adequate for her renal function currently  She will try to be more consistent with monitoring after meals  Also continue glipizide unchanged  Follow-up in 3 months  She is trying to improve her diet and exercise regimen and encouraged her to be consistent with this Follow-up with Dr. Justin Mend, labs have been faxed  There are no Patient Instructions on file for this visit.   Geran Haithcock 05/30/2017, 10:39 AM   Note: This office note was prepared with Dragon voice recognition system technology. Any transcriptional errors that result from this process are unintentional.  Total visit time for evaluation and  management of multiple problems and counseling = 25 minutes

## 2017-06-03 ENCOUNTER — Ambulatory Visit: Payer: Medicare Other | Admitting: Endocrinology

## 2017-07-11 ENCOUNTER — Other Ambulatory Visit: Payer: Self-pay | Admitting: Endocrinology

## 2017-08-20 ENCOUNTER — Other Ambulatory Visit: Payer: Self-pay | Admitting: Endocrinology

## 2017-09-03 ENCOUNTER — Other Ambulatory Visit: Payer: Medicare Other

## 2017-09-05 ENCOUNTER — Other Ambulatory Visit (INDEPENDENT_AMBULATORY_CARE_PROVIDER_SITE_OTHER): Payer: Medicare Other

## 2017-09-05 DIAGNOSIS — E1165 Type 2 diabetes mellitus with hyperglycemia: Secondary | ICD-10-CM | POA: Diagnosis not present

## 2017-09-05 LAB — COMPREHENSIVE METABOLIC PANEL WITH GFR
ALT: 16 U/L (ref 0–35)
AST: 20 U/L (ref 0–37)
Albumin: 3.7 g/dL (ref 3.5–5.2)
Alkaline Phosphatase: 80 U/L (ref 39–117)
BUN: 22 mg/dL (ref 6–23)
CO2: 31 meq/L (ref 19–32)
Calcium: 10.1 mg/dL (ref 8.4–10.5)
Chloride: 103 meq/L (ref 96–112)
Creatinine, Ser: 1.15 mg/dL (ref 0.40–1.20)
GFR: 49.4 mL/min — ABNORMAL LOW
Glucose, Bld: 156 mg/dL — ABNORMAL HIGH (ref 70–99)
Potassium: 5.3 meq/L — ABNORMAL HIGH (ref 3.5–5.1)
Sodium: 137 meq/L (ref 135–145)
Total Bilirubin: 0.4 mg/dL (ref 0.2–1.2)
Total Protein: 7 g/dL (ref 6.0–8.3)

## 2017-09-05 LAB — HEMOGLOBIN A1C: Hgb A1c MFr Bld: 7.3 % — ABNORMAL HIGH (ref 4.6–6.5)

## 2017-09-06 ENCOUNTER — Encounter: Payer: Self-pay | Admitting: Endocrinology

## 2017-09-06 ENCOUNTER — Ambulatory Visit: Payer: Medicare Other | Admitting: Endocrinology

## 2017-09-06 VITALS — BP 126/84 | HR 61 | Ht 60.0 in | Wt 198.6 lb

## 2017-09-06 DIAGNOSIS — E1165 Type 2 diabetes mellitus with hyperglycemia: Secondary | ICD-10-CM

## 2017-09-06 DIAGNOSIS — E875 Hyperkalemia: Secondary | ICD-10-CM

## 2017-09-06 NOTE — Progress Notes (Signed)
Patient ID: Shannon Kidd, female   DOB: 04-17-1946, 71 y.o.   MRN: [id number]           Reason for Appointment: Followup for Type 2 Diabetes  Referring physician: Leighton Ruff  History of Present Illness:          Diagnosis: Type 2 diabetes mellitus, date of diagnosis: 2000        Past history: She used Metformin for her diabetes for several years until early 2015 and was taking 1g bid before it was stopped. She thinks her blood sugars are well controlled with this. Also at some point she was given Actos but this was stopped because of edema. Metformin was stopped in early 2015 on the suggestion of a nephrologist because of her renal dysfunction Since early 2015 she had been taking Januvia 50 mg daily When her blood sugars were not well controlled with A1c of 8% in 2015 she was started on low dose metformin to control fasting hyperglycemia  Recent history:   Oral hypoglycemic drugs the patient is taking are: Glipizide ER 2. 5 mg with 1000 mg metformin ER at bedtime    A1c is now 7.3 and previously 6.9  Current management, blood sugar patterns and problems identified:  She now says that she has been inconsistent about her diet and eating out frequently and getting more fried food  She has gained weight again  She has relatively high fasting readings even with taking metformin 1000 mg  She is taking extended-release metformin for better tolerability and fasting readings are still relatively high  She has only a few postprandial readings and has had a couple of high readings  She thinks she is trying to walk but not as consistently as before  Lowest blood sugar 83 in the afternoon, no hypoglycemia with glipizide   Hypoglycemia: None          Side effects from medications have been: None Compliance with the medical regimen: Recently variable  Glucose monitoring:  done 1-2 times a day         Glucometer: Verio      Blood Glucose readings by time of day and averages from  meter  FASTING range 123-174 with AVERAGE 144  Lunch and suppertime range 83-1 37 After 7 PM blood sugar 90-221 with only 2 readings over 140  MEDIAN 140 with median in the morning and 144    Self-care:      Meals: 3 meals per day. Is eating out less, Breakfast is egg, toast.      Exercise: walking upto 20 min      Dietician visit: Most recent: 8/15.               Weight history: 195-245 Pounds previously  Wt Readings from Last 3 Encounters:  09/06/17 198 lb 9.6 oz (90.1 kg)  05/30/17 195 lb (88.5 kg)  03/01/17 193 lb 9.6 oz (87.8 kg)    Lab Results  Component Value Date   HGBA1C 7.3 (H) 09/05/2017   HGBA1C 6.9 (H) 05/28/2017   HGBA1C 7.2 (H) 02/26/2017   Lab Results  Component Value Date   MICROALBUR 7.2 (H) 02/26/2017   LDLCALC 24 05/28/2017   CREATININE 1.15 09/05/2017    Allergies as of 09/06/2017      Reactions   Actos [pioglitazone]    Swelling      Medication List        Accurate as of 09/06/17 11:02 AM. Always use your most recent med list.  amLODipine 5 MG tablet Commonly known as:  NORVASC TAKE ONE TABLET BY MOUTH ONCE DAILY   aspirin 81 MG tablet Take 81 mg by mouth daily.   glipiZIDE 2.5 MG 24 hr tablet Commonly known as:  GLUCOTROL XL TAKE 1 TABLET BY MOUTH ONCE DAILY WITH BREAKFAST   glucose blood test strip Commonly known as:  ONETOUCH VERIO USE TO CHECK BLOOD SUGAR ONCE DAILY Dx code E11.9   metFORMIN 500 MG 24 hr tablet Commonly known as:  GLUCOPHAGE-XR TAKE 2 TABLETS BY MOUTH ONCE DAILY WITH SUPPER.   MULTIVITAMIN PO Take 1 tablet by mouth daily.   ONETOUCH DELICA LANCETS 19X Misc USE TO CHECK BLOOD SUGAR ONCE DAILY Dx code E11.9   ONETOUCH VERIO IQ SYSTEM w/Device Kit USE TO CHECK BLOOD SUGAR ONCE DAILY Dx code E11.9   pramipexole 0.25 MG tablet Commonly known as:  MIRAPEX Take 0.25 mg by mouth daily.   rosuvastatin 20 MG tablet Commonly known as:  CRESTOR Take 1 tablet (20 mg total) by mouth daily.     sertraline 25 MG tablet Commonly known as:  ZOLOFT Take 25 mg by mouth daily.   vitamin B-12 1000 MCG tablet Commonly known as:  CYANOCOBALAMIN Take 1,000 mcg by mouth daily.   vitamin C 1000 MG tablet Take 1,000 mg by mouth daily.   Vitamin D 2000 units tablet Take 2,000 Units by mouth daily.       Allergies:  Allergies  Allergen Reactions  . Actos [Pioglitazone]     Swelling     Past Medical History:  Diagnosis Date  . Anemia   . Cervical myelopathy (HCC)    s/p cervical fusion @ [facility]  . CKD (chronic kidney disease), stage III ([place])   . Decreased pedal pulses    normal ABI 02/03/13  . Diabetes mellitus ([place])   . Glaucoma   . HTN (hypertension)   . Hyperlipemia   . Hypothyroid    When pregnant  . Rheumatic fever     Past Surgical History:  Procedure Laterality Date  . APPENDECTOMY    . NECK SURGERY    . TONSILLECTOMY    . TUBAL LIGATION    . VAGINAL HYSTERECTOMY      Family History  Problem Relation Age of Onset  . Diabetes Mother   . Cancer Mother        Colon  . Diabetes Sister   . Diabetes Paternal Grandmother     Social History:  reports that  has never smoked. she has never used smokeless tobacco. She reports that she does not drink alcohol. Her drug history is not on file.    Review of Systems       Lipids: She has been treated with Crestor 20 mg Followed by cardiologist HDL tends to be low        Lab Results  Component Value Date   CHOL 78 05/28/2017   HDL 32.30 (L) 05/28/2017   LDLCALC 24 05/28/2017   TRIG 109.0 05/28/2017   CHOLHDL 2 05/28/2017                  The blood pressure has been controlled with Norvasc alone Previously was getting hyperkalemia from lisinopril but appears to have a high POTASSIUM of 5.3 now Does not follow any particular diet      Diabetic foot exam done in 07/2016 with normal findings   Although her creatinine had gone up to 1.53 on her last visit is now back to  1.15 and not clear why  this had gone up She has not seen a nephrologist as recommended  Lab Results  Component Value Date   CREATININE 1.15 09/05/2017   CREATININE 1.53 (H) 05/28/2017   CREATININE 1.19 02/26/2017    She is on vitamin D supplements followed by PCP   LABS:  Lab on 09/05/2017  Component Date Value Ref Range Status  . Sodium 09/05/2017 137  135 - 145 mEq/L Final  . Potassium 09/05/2017 5.3* 3.5 - 5.1 mEq/L Final  . Chloride 09/05/2017 103  96 - 112 mEq/L Final  . CO2 09/05/2017 31  19 - 32 mEq/L Final  . Glucose, Bld 09/05/2017 156* 70 - 99 mg/dL Final  . BUN 09/05/2017 22  6 - 23 mg/dL Final  . Creatinine, Ser 09/05/2017 1.15  0.40 - 1.20 mg/dL Final  . Total Bilirubin 09/05/2017 0.4  0.2 - 1.2 mg/dL Final  . Alkaline Phosphatase 09/05/2017 80  39 - 117 U/L Final  . AST 09/05/2017 20  0 - 37 U/L Final  . ALT 09/05/2017 16  0 - 35 U/L Final  . Total Protein 09/05/2017 7.0  6.0 - 8.3 g/dL Final  . Albumin 09/05/2017 3.7  3.5 - 5.2 g/dL Final  . Calcium 09/05/2017 10.1  8.4 - 10.5 mg/dL Final  . GFR 09/05/2017 49.40* >60.00 mL/min Final  . Hgb A1c MFr Bld 09/05/2017 7.3* 4.6 - 6.5 % Final   Glycemic Control Guidelines for People with Diabetes:Non Diabetic:  <6%Goal of Therapy: <7%Additional Action Suggested:  >8%     Physical Examination:  BP 126/84   Pulse 61   Ht 5' (1.524 m)   Wt 198 lb 9.6 oz (90.1 kg)   SpO2 95%   BMI 38.79 kg/m    ASSESSMENT:  Diabetes type 2 with obesity, BMI 87 See history of present illness for detailed discussion of current diabetes management, blood sugar patterns and problems identified  Blood sugars are  known as well controlled and she has gained weight A1c is 7.3 and she likely has more postprandial hyperglycemia since fasting blood sugars are averaging below 150 Her metformin dose is limited by tolerability  RENAL dysfunction:  improved  Hyperkalemia: Not clear if etiology-she is not on any drugs that would cause this  HYPERTENSION:  Blood pressure  is controlled To continue Norvasc   PLAN:   She will continue her metformin unchanged   Since blood sugars are low normal at suppertime will not change her glipizide  She does need to significantly improve her diet and discussed importance of low-fat diet and controlling portions and calories  She will check more readings after meals Low potassium diet given  There are no Patient Instructions on file for this visit.   Shannon Kidd 09/06/2017, 11:02 AM   Note: This office note was prepared with Estate agent. Any transcriptional errors that result from this process are unintentional.  Total visit time for evaluation and management of multiple problems and counseling = 25 minutes

## 2017-09-06 NOTE — Patient Instructions (Addendum)
Low fat food, low potassium  Need weight loss

## 2017-10-02 ENCOUNTER — Other Ambulatory Visit: Payer: Self-pay | Admitting: Endocrinology

## 2017-11-07 ENCOUNTER — Other Ambulatory Visit: Payer: Self-pay | Admitting: Endocrinology

## 2017-11-27 ENCOUNTER — Other Ambulatory Visit: Payer: Self-pay | Admitting: Endocrinology

## 2017-11-29 ENCOUNTER — Other Ambulatory Visit (INDEPENDENT_AMBULATORY_CARE_PROVIDER_SITE_OTHER): Payer: Medicare Other

## 2017-11-29 DIAGNOSIS — E1165 Type 2 diabetes mellitus with hyperglycemia: Secondary | ICD-10-CM | POA: Diagnosis not present

## 2017-11-29 LAB — HEMOGLOBIN A1C: Hgb A1c MFr Bld: 7.2 % — ABNORMAL HIGH (ref 4.6–6.5)

## 2017-11-29 LAB — BASIC METABOLIC PANEL WITH GFR
BUN: 24 mg/dL — ABNORMAL HIGH (ref 6–23)
CO2: 28 meq/L (ref 19–32)
Calcium: 9 mg/dL (ref 8.4–10.5)
Chloride: 105 meq/L (ref 96–112)
Creatinine, Ser: 1.21 mg/dL — ABNORMAL HIGH (ref 0.40–1.20)
GFR: 46.55 mL/min — ABNORMAL LOW
Glucose, Bld: 120 mg/dL — ABNORMAL HIGH (ref 70–99)
Potassium: 5.1 meq/L (ref 3.5–5.1)
Sodium: 139 meq/L (ref 135–145)

## 2017-12-06 ENCOUNTER — Ambulatory Visit: Payer: Medicare Other | Admitting: Endocrinology

## 2017-12-06 ENCOUNTER — Encounter: Payer: Self-pay | Admitting: Endocrinology

## 2017-12-06 VITALS — BP 134/74 | HR 59 | Ht 60.0 in | Wt 197.2 lb

## 2017-12-06 DIAGNOSIS — E119 Type 2 diabetes mellitus without complications: Secondary | ICD-10-CM

## 2017-12-06 NOTE — Progress Notes (Signed)
Patient ID: Shannon Kidd, female   DOB: Aug 23, 1946, 72 y.o.   MRN: [id number]           Reason for Appointment: Followup for Type 2 Diabetes  Referring physician: Leighton Ruff  History of Present Illness:          Diagnosis: Type 2 diabetes mellitus, date of diagnosis: 2000        Past history: She used Metformin for her diabetes for several years until early 2015 and was taking 1g bid before it was stopped. She thinks her blood sugars are well controlled with this. Also at some point she was given Actos but this was stopped because of edema. Metformin was stopped in early 2015 on the suggestion of a nephrologist because of her renal dysfunction Since early 2015 she had been taking Januvia 50 mg daily When her blood sugars were not well controlled with A1c of 8% in 2015 she was started on low dose metformin to control fasting hyperglycemia  Recent history:   Oral hypoglycemic drugs the patient is taking are: Glipizide ER 2. 5 mg with 1000 mg metformin ER at bedtime    A1c is now 7.3 and previously 6.9  Current management, blood sugar patterns and problems identified:  She has started doing better with her diet, likely had not been doing well on the last visit and A1c had gone up  She is checking blood sugars somewhat infrequently but they seem to be fairly good for the last month  May not have been consistent with diet until recently and so weight is down only 1 pound  She has  no hypoglycemia with glipizide  Also tolerating metformin taken at night   Hypoglycemia: None          Side effects from medications have been: None Compliance with the medical regimen: Recently better  Glucose monitoring:  done 1 or less times a day         Glucometer: Verio      Blood Glucose readings by time of day and averages from meter  30 day average 116, 90 day average 123 Blood sugar range 100-158 Highest morning reading is 131, highest reading was 158 at lunch  Self-care:      Meals: 3 meals per day. Is eating out less, Breakfast is egg, toast.      Exercise: walking upto 20 min      Dietician visit: Most recent: 8/15.               Weight history: 195-245 Pounds previously  Wt Readings from Last 3 Encounters:  12/06/17 197 lb 3.2 oz (89.4 kg)  09/06/17 198 lb 9.6 oz (90.1 kg)  05/30/17 195 lb (88.5 kg)    Lab Results  Component Value Date   HGBA1C 7.2 (H) 11/29/2017   HGBA1C 7.3 (H) 09/05/2017   HGBA1C 6.9 (H) 05/28/2017   Lab Results  Component Value Date   MICROALBUR 7.2 (H) 02/26/2017   LDLCALC 24 05/28/2017   CREATININE 1.21 (H) 11/29/2017    Allergies as of 12/06/2017      Reactions   Actos [pioglitazone]    Swelling      Medication List        Accurate as of 12/06/17  3:30 PM. Always use your most recent med list.          amLODipine 5 MG tablet Commonly known as:  NORVASC TAKE 1 TABLET BY MOUTH ONCE DAILY   aspirin 81 MG tablet Take 81  mg by mouth daily.   glipiZIDE 2.5 MG 24 hr tablet Commonly known as:  GLUCOTROL XL TAKE 1 TABLET BY MOUTH ONCE DAILY WITH BREAKFAST   glucose blood test strip Commonly known as:  ONETOUCH VERIO USE TO CHECK BLOOD SUGAR ONCE DAILY Dx code E11.9   metFORMIN 500 MG 24 hr tablet Commonly known as:  GLUCOPHAGE-XR TAKE 2 TABLETS BY MOUTH ONCE DAILY WITH SUPPER.   MULTIVITAMIN PO Take 1 tablet by mouth daily.   ONETOUCH DELICA LANCETS 58N Misc USE TO CHECK BLOOD SUGAR ONCE DAILY Dx code E11.9   ONETOUCH VERIO IQ SYSTEM w/Device Kit USE TO CHECK BLOOD SUGAR ONCE DAILY Dx code E11.9   pramipexole 0.25 MG tablet Commonly known as:  MIRAPEX Take 0.25 mg by mouth daily.   rosuvastatin 20 MG tablet Commonly known as:  CRESTOR Take 1 tablet (20 mg total) by mouth daily.   sertraline 25 MG tablet Commonly known as:  ZOLOFT Take 25 mg by mouth daily.   vitamin B-12 1000 MCG tablet Commonly known as:  CYANOCOBALAMIN Take 1,000 mcg by mouth daily.   vitamin C 1000 MG tablet Take 1,000  mg by mouth daily.   Vitamin D 2000 units tablet Take 2,000 Units by mouth daily.       Allergies:  Allergies  Allergen Reactions  . Actos [Pioglitazone]     Swelling     Past Medical History:  Diagnosis Date  . Anemia   . Cervical myelopathy (HCC)    s/p cervical fusion @ [facility]  . CKD (chronic kidney disease), stage III ([place])   . Decreased pedal pulses    normal ABI 02/03/13  . Diabetes mellitus ([place])   . Glaucoma   . HTN (hypertension)   . Hyperlipemia   . Hypothyroid    When pregnant  . Rheumatic fever     Past Surgical History:  Procedure Laterality Date  . APPENDECTOMY    . NECK SURGERY    . TONSILLECTOMY    . TUBAL LIGATION    . VAGINAL HYSTERECTOMY      Family History  Problem Relation Age of Onset  . Diabetes Mother   . Cancer Mother        Colon  . Diabetes Sister   . Diabetes Paternal Grandmother     Social History:  reports that  has never smoked. she has never used smokeless tobacco. She reports that she does not drink alcohol. Her drug history is not on file.    Review of Systems       Lipids: She has been treated with Crestor 20 mg by cardiologist HDL tends to be low        Lab Results  Component Value Date   CHOL 78 05/28/2017   HDL 32.30 (L) 05/28/2017   LDLCALC 24 05/28/2017   TRIG 109.0 05/28/2017   CHOLHDL 2 05/28/2017                  The blood pressure has been controlled with Norvasc   Previously was getting hyperkalemia from lisinopril but appears to have a high normal  POTASSIUM of 5.1 Does not follow any particular diet      Diabetic foot exam done in 07/2016 with normal findings   Although her creatinine had gone up to 1.53 on her last visit is now back to 1.15 and not clear why this had gone up She has not seen a nephrologist as recommended  Lab Results  Component Value Date  CREATININE 1.21 (H) 11/29/2017   CREATININE 1.15 09/05/2017   CREATININE 1.53 (H) 05/28/2017    She is on vitamin D  supplements followed by PCP   LABS:  No visits with results within 1 Week(s) from this visit.  Latest known visit with results is:  Lab on 11/29/2017  Component Date Value Ref Range Status  . Sodium 11/29/2017 139  135 - 145 mEq/L Final  . Potassium 11/29/2017 5.1  3.5 - 5.1 mEq/L Final  . Chloride 11/29/2017 105  96 - 112 mEq/L Final  . CO2 11/29/2017 28  19 - 32 mEq/L Final  . Glucose, Bld 11/29/2017 120* 70 - 99 mg/dL Final  . BUN 11/29/2017 24* 6 - 23 mg/dL Final  . Creatinine, Ser 11/29/2017 1.21* 0.40 - 1.20 mg/dL Final  . Calcium 11/29/2017 9.0  8.4 - 10.5 mg/dL Final  . GFR 11/29/2017 46.55* >60.00 mL/min Final  . Hgb A1c MFr Bld 11/29/2017 7.2* 4.6 - 6.5 % Final   Glycemic Control Guidelines for People with Diabetes:Non Diabetic:  <6%Goal of Therapy: <7%Additional Action Suggested:  >8%     Physical Examination:  BP 134/74   Pulse (!) 59   Ht 5' (1.524 m)   Wt 197 lb 3.2 oz (89.4 kg)   SpO2 96%   BMI 38.51 kg/m    ASSESSMENT:  Diabetes type 2 with obesity, BMI 87  See history of present illness for discussion of current diabetes management, blood sugar patterns and problems identified  Her A1c is only slightly better at 7.2 Although this is probably adequate for her and her blood sugars are generally fairly good she thinks she can still continue to improve her diet for further weight loss May be able to walk more after winter  Her metformin dose is limited since she has side effects with higher doses  RENAL dysfunction:  improved and stable  Hyperkalemia: Not clear if etiology-potassium is high normal She will continue to restrict diet  HYPERTENSION: Blood pressure  is controlled on Norvasc   PLAN:  No change in current management as discussed above She will try to check more readings after meals Follow-up in 4 months  There are no Patient Instructions on file for this visit.   Elayne Snare 12/06/2017, 3:30 PM   Note: This office note was prepared  with Dragon voice recognition system technology. Any transcriptional errors that result from this process are unintentional.  Total visit time for evaluation and management of multiple problems and counseling = 25 minutes

## 2018-01-21 ENCOUNTER — Other Ambulatory Visit: Payer: Self-pay | Admitting: Family Medicine

## 2018-01-21 DIAGNOSIS — M8580 Other specified disorders of bone density and structure, unspecified site: Secondary | ICD-10-CM

## 2018-01-21 DIAGNOSIS — Z1231 Encounter for screening mammogram for malignant neoplasm of breast: Secondary | ICD-10-CM

## 2018-02-21 ENCOUNTER — Ambulatory Visit: Payer: Medicare Other

## 2018-02-21 ENCOUNTER — Other Ambulatory Visit: Payer: Medicare Other

## 2018-02-25 ENCOUNTER — Other Ambulatory Visit: Payer: Self-pay | Admitting: Endocrinology

## 2018-02-27 ENCOUNTER — Other Ambulatory Visit: Payer: Self-pay | Admitting: Endocrinology

## 2018-03-10 ENCOUNTER — Other Ambulatory Visit: Payer: Self-pay | Admitting: Endocrinology

## 2018-04-02 ENCOUNTER — Ambulatory Visit
Admission: RE | Admit: 2018-04-02 | Discharge: 2018-04-02 | Disposition: A | Discharge status: Home / Self Care | Payer: Medicare Other | Source: Ambulatory Visit | Attending: Family Medicine | Admitting: Family Medicine

## 2018-04-02 DIAGNOSIS — M8580 Other specified disorders of bone density and structure, unspecified site: Secondary | ICD-10-CM

## 2018-04-02 DIAGNOSIS — Z1231 Encounter for screening mammogram for malignant neoplasm of breast: Secondary | ICD-10-CM

## 2018-04-08 ENCOUNTER — Other Ambulatory Visit: Payer: Self-pay | Admitting: Endocrinology

## 2018-04-09 ENCOUNTER — Other Ambulatory Visit (INDEPENDENT_AMBULATORY_CARE_PROVIDER_SITE_OTHER): Payer: Medicare Other

## 2018-04-09 DIAGNOSIS — E119 Type 2 diabetes mellitus without complications: Secondary | ICD-10-CM

## 2018-04-09 LAB — COMPREHENSIVE METABOLIC PANEL WITH GFR
ALT: 12 U/L (ref 0–35)
AST: 18 U/L (ref 0–37)
Albumin: 3.7 g/dL (ref 3.5–5.2)
Alkaline Phosphatase: 66 U/L (ref 39–117)
BUN: 27 mg/dL — ABNORMAL HIGH (ref 6–23)
CO2: 26 meq/L (ref 19–32)
Calcium: 9.3 mg/dL (ref 8.4–10.5)
Chloride: 105 meq/L (ref 96–112)
Creatinine, Ser: 1.22 mg/dL — ABNORMAL HIGH (ref 0.40–1.20)
GFR: 46.07 mL/min — ABNORMAL LOW
Glucose, Bld: 135 mg/dL — ABNORMAL HIGH (ref 70–99)
Potassium: 4.7 meq/L (ref 3.5–5.1)
Sodium: 139 meq/L (ref 135–145)
Total Bilirubin: 0.3 mg/dL (ref 0.2–1.2)
Total Protein: 6.8 g/dL (ref 6.0–8.3)

## 2018-04-09 LAB — MICROALBUMIN / CREATININE URINE RATIO
Creatinine,U: 81.8 mg/dL
Microalb Creat Ratio: 30.9 mg/g — ABNORMAL HIGH (ref 0.0–30.0)
Microalb, Ur: 25.3 mg/dL — ABNORMAL HIGH (ref 0.0–1.9)

## 2018-04-09 LAB — HEMOGLOBIN A1C: Hgb A1c MFr Bld: 7.1 % — ABNORMAL HIGH (ref 4.6–6.5)

## 2018-04-11 ENCOUNTER — Other Ambulatory Visit: Payer: Medicare Other

## 2018-04-14 ENCOUNTER — Ambulatory Visit: Payer: Medicare Other | Admitting: Endocrinology

## 2018-04-14 ENCOUNTER — Encounter: Payer: Self-pay | Admitting: Endocrinology

## 2018-04-14 VITALS — BP 140/68 | HR 56 | Ht 60.0 in | Wt 197.6 lb

## 2018-04-14 DIAGNOSIS — E119 Type 2 diabetes mellitus without complications: Secondary | ICD-10-CM | POA: Diagnosis not present

## 2018-04-14 NOTE — Progress Notes (Signed)
Patient ID: Shannon Kidd, female   DOB: September 13, 1946, 72 y.o.   MRN: [id number]           Reason for Appointment: Followup for Type 2 Diabetes  Referring physician: Leighton Ruff  History of Present Illness:          Diagnosis: Type 2 diabetes mellitus, date of diagnosis: 2000        Past history: She used Metformin for her diabetes for several years until early 2015 and was taking 1g bid before it was stopped. She thinks her blood sugars are well controlled with this. Also at some point she was given Actos but this was stopped because of edema. Metformin was stopped in early 2015 on the suggestion of a nephrologist because of her renal dysfunction Since early 2015 she had been taking Januvia 50 mg daily When her blood sugars were not well controlled with A1c of 8% in 2015 she was started on low dose metformin to control fasting hyperglycemia  Recent history:   Oral hypoglycemic drugs the patient is taking are: Glipizide ER 2. 5 mg with 1000 mg metformin ER at bedtime    A1c is now 7.1 with previous range 6.9-7.3  Current management, blood sugar patterns and problems identified:  She has difficulty losing weight again even though she thinks she is watching her portions and snacks fairly well  Also she is trying to do as much walking as she can around her apartment  Blood sugars are excellent at home although still relatively high fasting including on the lab  She does a few readings after meals at night also and generally is able to control her readings very well  She has  no hypoglycemia with glipizide ER 2.5 mg daily, lowest blood sugar 82  Her metformin is taken at night   Hypoglycemia: None          Side effects from medications have been: None Compliance with the medical regimen: Recently better  Glucose monitoring:  done 1 or less times a day         Glucometer: Verio      Blood Glucose readings by time of day and averages from meter  30 day average 126  Blood  sugar range 82-162 FASTING range 111-136  Self-care:      Meals: 3 meals per day. Is eating out less, Breakfast is egg, toast.      Exercise: walking upto 20 min      Dietician visit: Most recent: 8/15.               Weight history: 195-245 Pounds previously  Wt Readings from Last 3 Encounters:  04/14/18 197 lb 9.6 oz (89.6 kg)  12/06/17 197 lb 3.2 oz (89.4 kg)  09/06/17 198 lb 9.6 oz (90.1 kg)    Lab Results  Component Value Date   HGBA1C 7.1 (H) 04/09/2018   HGBA1C 7.2 (H) 11/29/2017   HGBA1C 7.3 (H) 09/05/2017   Lab Results  Component Value Date   MICROALBUR 25.3 (H) 04/09/2018   LDLCALC 24 05/28/2017   CREATININE 1.22 (H) 04/09/2018    Allergies as of 04/14/2018      Reactions   Actos [pioglitazone]    Swelling      Medication List        Accurate as of 04/14/18 12:47 PM. Always use your most recent med list.          amLODipine 5 MG tablet Commonly known as:  NORVASC TAKE 1 TABLET  BY MOUTH ONCE DAILY   aspirin 81 MG tablet Take 81 mg by mouth daily.   glipiZIDE 2.5 MG 24 hr tablet Commonly known as:  GLUCOTROL XL TAKE 1 TABLET BY MOUTH ONCE DAILY WITH BREAKFAST   metFORMIN 500 MG 24 hr tablet Commonly known as:  GLUCOPHAGE-XR TAKE 2 TABLETS BY MOUTH ONCE DAILY WITH SUPPER.   MULTIVITAMIN PO Take 1 tablet by mouth daily.   [place] LANCETS 28B Misc USE TO CHECK BLOOD SUGAR ONCE DAILY Dx code E11.9   ONETOUCH VERIO IQ SYSTEM w/Device Kit USE TO CHECK BLOOD SUGAR ONCE DAILY Dx code E11.9   ONETOUCH VERIO test strip Generic drug:  glucose blood USE 1 STRIP TO CHECK GLUCOSE ONCE DAILY   pramipexole 0.25 MG tablet Commonly known as:  MIRAPEX Take 0.25 mg by mouth daily.   rosuvastatin 20 MG tablet Commonly known as:  CRESTOR Take 1 tablet (20 mg total) by mouth daily.   sertraline 25 MG tablet Commonly known as:  ZOLOFT Take 25 mg by mouth daily.   vitamin B-12 1000 MCG tablet Commonly known as:  CYANOCOBALAMIN Take 1,000 mcg by  mouth daily.   vitamin C 1000 MG tablet Take 1,000 mg by mouth daily.   Vitamin D 2000 units tablet Take 2,000 Units by mouth daily.       Allergies:  Allergies  Allergen Reactions  . Actos [Pioglitazone]     Swelling     Past Medical History:  Diagnosis Date  . Anemia   . Cervical myelopathy (HCC)    s/p cervical fusion @ [facility]  . CKD (chronic kidney disease), stage III ([place])   . Decreased pedal pulses    normal ABI 02/03/13  . Diabetes mellitus ([place])   . Glaucoma   . HTN (hypertension)   . Hyperlipemia   . Hypothyroid    When pregnant  . Rheumatic fever     Past Surgical History:  Procedure Laterality Date  . APPENDECTOMY    . NECK SURGERY    . TONSILLECTOMY    . TUBAL LIGATION    . VAGINAL HYSTERECTOMY      Family History  Problem Relation Age of Onset  . Diabetes Mother   . Cancer Mother        Colon  . Diabetes Sister   . Diabetes Paternal Grandmother   . Breast cancer Maternal Grandmother 47    Social History:  reports that she has never smoked. She has never used smokeless tobacco. She reports that she does not drink alcohol. Her drug history is not on file.    Review of Systems       Lipids: She has been treated with Crestor 20 mg by cardiologist 30 HDL tends to be low        Lab Results  Component Value Date   CHOL 78 05/28/2017   HDL 32.30 (L) 05/28/2017   LDLCALC 24 05/28/2017   TRIG 109.0 05/28/2017   CHOLHDL 2 05/28/2017                  The blood pressure has been controlled with Norvasc   BP Readings from Last 3 Encounters:  04/14/18 140/68  12/06/17 134/74  09/06/17 126/84    POTASSIUM: Previously was getting hyperkalemia from lisinopril and this is not improved at 4.7 Has been asked to watch for high potassium foods      Diabetic foot exam done in 07/2016 with normal findings   Although her creatinine had gone  up to 1.53 on  a previous visit it is now more consistently normal  Does not see  nephrologist  Lab Results  Component Value Date   CREATININE 1.22 (H) 04/09/2018   CREATININE 1.21 (H) 11/29/2017   CREATININE 1.15 09/05/2017    She is on vitamin D supplements followed by PCP   LABS:  Lab on 04/09/2018  Component Date Value Ref Range Status  . Microalb, Ur 04/09/2018 25.3* 0.0 - 1.9 mg/dL Final  . Creatinine,U 04/09/2018 81.8  mg/dL Final  . Microalb Creat Ratio 04/09/2018 30.9* 0.0 - 30.0 mg/g Final  . Sodium 04/09/2018 139  135 - 145 mEq/L Final  . Potassium 04/09/2018 4.7  3.5 - 5.1 mEq/L Final  . Chloride 04/09/2018 105  96 - 112 mEq/L Final  . CO2 04/09/2018 26  19 - 32 mEq/L Final  . Glucose, Bld 04/09/2018 135* 70 - 99 mg/dL Final  . BUN 04/09/2018 27* 6 - 23 mg/dL Final  . Creatinine, Ser 04/09/2018 1.22* 0.40 - 1.20 mg/dL Final  . Total Bilirubin 04/09/2018 0.3  0.2 - 1.2 mg/dL Final  . Alkaline Phosphatase 04/09/2018 66  39 - 117 U/L Final  . AST 04/09/2018 18  0 - 37 U/L Final  . ALT 04/09/2018 12  0 - 35 U/L Final  . Total Protein 04/09/2018 6.8  6.0 - 8.3 g/dL Final  . Albumin 04/09/2018 3.7  3.5 - 5.2 g/dL Final  . Calcium 04/09/2018 9.3  8.4 - 10.5 mg/dL Final  . GFR 04/09/2018 46.07* >60.00 mL/min Final  . Hgb A1c MFr Bld 04/09/2018 7.1* 4.6 - 6.5 % Final   Glycemic Control Guidelines for People with Diabetes:Non Diabetic:  <6%Goal of Therapy: <7%Additional Action Suggested:  >8%     Physical Examination:  BP 140/68 (Cuff Size: Large)   Pulse (!) 56   Ht 5' (1.524 m)   Wt 197 lb 9.6 oz (89.6 kg)   SpO2 98%   BMI 38.59 kg/m   No ankle edema present  ASSESSMENT:  Diabetes type 2 with obesity, BMI 38  See history of present illness for discussion of current diabetes management, blood sugar patterns and problems identified  Her A1c is slightly better at 7.1  She is on a regimen of 2.5 mg glipizide ER and 1000 mg of metformin ER  She has been trying to do fairly well with her diet regimen and walking as much as he can Although  her weight is still about the same this is usually at the lower end of her previous range Blood sugars are reasonably good although higher than normal frequently in the morning fasting Postprandial readings are not higher than 160 as on her home monitor  She is tolerating current doses of metformin and blood sugars are also being regulated by glipizide ER 2.5 mg without hypoglycemia  RENAL dysfunction: Still borderline and stable, has minimal increase in urine microalbumin  Hyperkalemia: Potassium is relatively better  HYPERTENSION: Blood pressure  is controlled on Norvasc, slightly higher in the office today   PLAN:  No change in current diabetes medications Continue to increase activity More readings after meals Recheck urine microalbumin on the next visit  Patient Instructions  Check blood sugars on waking up  2-3/7  Also check blood sugars about 2 hours after a meal and do this after different meals by rotation  Recommended blood sugar levels on waking up is 90-130 and about 2 hours after meal is 130-160  Please bring your blood sugar monitor  to each visit, thank you     Elayne Snare 04/14/2018, 12:47 PM   Note: This office note was prepared with Dragon voice recognition system technology. Any transcriptional errors that result from this process are unintentional.

## 2018-04-14 NOTE — Patient Instructions (Signed)
Check blood sugars on waking up  2-3/7  Also check blood sugars about 2 hours after a meal and do this after different meals by rotation  Recommended blood sugar levels on waking up is 90-130 and about 2 hours after meal is 130-160  Please bring your blood sugar monitor to each visit, thank you   

## 2018-04-28 ENCOUNTER — Other Ambulatory Visit: Payer: Self-pay | Admitting: Cardiology

## 2018-05-13 NOTE — Progress Notes (Signed)
HPI: FU AS. Patient has a history of rheumatic fever by her report. Nuclear study May 2017 showed no ischemia or infarction. Ejection fraction 54%. Carotid Dopplers May 2017 showed 1-39% bilateral stenosis. Last echocardiogram 02/05/2017 showed normal LV systolic function, moderate to severe aortic stenosis with mean gradient 31 mmHg and mild left atrial enlargement. Since last seen  patient describes worsening dyspnea on exertion.  She denies orthopnea, PND, increased pedal edema or syncope.  She has some dizziness with changing positions.  She had one episode of chest pain that improved with antacids 1 month ago.  The pain did not radiate.  No associated symptoms.  She has not had exertional chest pain.  Current Outpatient Medications  Medication Sig Dispense Refill  . amLODipine (NORVASC) 5 MG tablet TAKE 1 TABLET BY MOUTH ONCE DAILY 30 tablet 5  . Ascorbic Acid (VITAMIN C) 1000 MG tablet Take 1,000 mg by mouth daily.    [place] Kitchen aspirin 81 MG tablet Take 81 mg by mouth daily.    . Blood Glucose Monitoring Suppl (ONETOUCH VERIO IQ SYSTEM) w/Device KIT USE TO CHECK BLOOD SUGAR ONCE DAILY Dx code E11.9 1 kit 2  . Cholecalciferol (VITAMIN D) 2000 UNITS tablet Take 2,000 Units by mouth daily.    Marland Kitchen glipiZIDE (GLUCOTROL XL) 2.5 MG 24 hr tablet TAKE 1 TABLET BY MOUTH ONCE DAILY WITH BREAKFAST 90 tablet 0  . metFORMIN (GLUCOPHAGE-XR) 500 MG 24 hr tablet TAKE 2 TABLETS BY MOUTH ONCE DAILY WITH SUPPER. 60 tablet 3  . Multiple Vitamins-Minerals (MULTIVITAMIN PO) Take 1 tablet by mouth daily.    [place] DELICA LANCETS 18H MISC USE TO CHECK BLOOD SUGAR ONCE DAILY Dx code E11.9 100 each 2  . ONETOUCH VERIO test strip USE 1 STRIP TO CHECK GLUCOSE ONCE DAILY 50 each 4  . pramipexole (MIRAPEX) 0.25 MG tablet Take 0.25 mg by mouth daily.    . rosuvastatin (CRESTOR) 20 MG tablet Take 1 tablet (20 mg total) by mouth daily. Please schedule yearly appt for more refill, thanks! 1st ATTMPT [phone] 30 tablet 0    . sertraline (ZOLOFT) 25 MG tablet Take 25 mg by mouth daily.    . vitamin B-12 (CYANOCOBALAMIN) 1000 MCG tablet Take 1,000 mcg by mouth daily.     No current facility-administered medications for this visit.      Past Medical History:  Diagnosis Date  . Anemia   . Cervical myelopathy (HCC)    s/p cervical fusion @ [facility]  . CKD (chronic kidney disease), stage III ([place])   . Decreased pedal pulses    normal ABI 02/03/13  . Diabetes mellitus ([place])   . Glaucoma   . HTN (hypertension)   . Hyperlipemia   . Hypothyroid    When pregnant  . Rheumatic fever     Past Surgical History:  Procedure Laterality Date  . APPENDECTOMY    . NECK SURGERY    . TONSILLECTOMY    . TUBAL LIGATION    . VAGINAL HYSTERECTOMY      Social History   Socioeconomic History  . Marital status: Married    Spouse name: Not on file  . Number of children: 2  . Years of education: Not on file  . Highest education level: Not on file  Occupational History  . Not on file  Social Needs  . Financial resource strain: Not on file  . Food insecurity:    Worry: Not on file    Inability: Not on  file  . Transportation needs:    Medical: Not on file    Non-medical: Not on file  Tobacco Use  . Smoking status: Never Smoker  . Smokeless tobacco: Never Used  Substance and Sexual Activity  . Alcohol use: No    Alcohol/week: 0.0 standard drinks  . Drug use: Not on file  . Sexual activity: Not on file  Lifestyle  . Physical activity:    Days per week: Not on file    Minutes per session: Not on file  . Stress: Not on file  Relationships  . Social connections:    Talks on phone: Not on file    Gets together: Not on file    Attends religious service: Not on file    Active member of club or organization: Not on file    Attends meetings of clubs or organizations: Not on file    Relationship status: Not on file  . Intimate partner violence:    Fear of current or ex partner: Not on file    Emotionally  abused: Not on file    Physically abused: Not on file    Forced sexual activity: Not on file  Other Topics Concern  . Not on file  Social History Narrative  . Not on file    Family History  Problem Relation Age of Onset  . Diabetes Mother   . Cancer Mother        Colon  . Diabetes Sister   . Diabetes Paternal Grandmother   . Breast cancer Maternal Grandmother 75    ROS: no fevers or chills, productive cough, hemoptysis, dysphasia, odynophagia, melena, hematochezia, dysuria, hematuria, rash, seizure activity, orthopnea, PND, pedal edema, claudication. Remaining systems are negative.  Physical Exam: Well-developed well-nourished in no acute distress.  Skin is warm and dry.  HEENT is normal.  Neck is supple.  Chest is clear to auscultation with normal expansion.  Cardiovascular exam is regular rate and rhythm.  3/6 systolic murmur left sternal border radiating to the carotids.  S2 mildly diminished. Abdominal exam nontender or distended. No masses palpated. Extremities show trace edema. neuro grossly intact  ECG-sinus rhythm at a rate of 62.  Inferior T wave changes.  Personally reviewed  A/P  1 aortic stenosis-patient is describing worsening dyspnea on exertion since last office visit.  We will arrange follow-up echocardiogram to reassess aortic stenosis.  I will see her back immediately after her echocardiogram.  She will likely require aortic valve replacement in the near future.  She would require cardiac catheterization prior to procedure.  Question candidate for TAVR.  2 hypertension-blood pressure is elevated.  However she is having some orthostatic symptoms.  I will therefore not increase medications at this point.  3 hyperlipidemia-continue statin.  4 obesity-we discussed the importance of weight loss.  Kirk Ruths, MD

## 2018-05-16 ENCOUNTER — Encounter: Payer: Self-pay | Admitting: Cardiology

## 2018-05-16 ENCOUNTER — Ambulatory Visit: Payer: Medicare Other | Admitting: Cardiology

## 2018-05-16 VITALS — BP 160/82 | HR 62 | Ht 60.0 in | Wt 201.2 lb

## 2018-05-16 DIAGNOSIS — I10 Essential (primary) hypertension: Secondary | ICD-10-CM

## 2018-05-16 DIAGNOSIS — I350 Nonrheumatic aortic (valve) stenosis: Secondary | ICD-10-CM

## 2018-05-16 DIAGNOSIS — E7800 Pure hypercholesterolemia, unspecified: Secondary | ICD-10-CM | POA: Diagnosis not present

## 2018-05-16 MED ORDER — ROSUVASTATIN CALCIUM 20 MG PO TABS: 20.0000 mg | ORAL_TABLET | Freq: Every day | ORAL | 3 refills | Status: DC

## 2018-05-16 MED ORDER — AMLODIPINE BESYLATE 5 MG PO TABS: 5.0000 mg | ORAL_TABLET | Freq: Every day | ORAL | 3 refills | Status: DC

## 2018-05-16 NOTE — Patient Instructions (Signed)
Medication Instructions:   NO CHANGE  Testing/Procedures:  Your physician has requested that you have an echocardiogram. Echocardiography is a painless test that uses sound waves to create images of your heart. It provides your doctor with information about the size and shape of your heart and how well your heart's chambers and valves are working. This procedure takes approximately one hour. There are no restrictions for this procedure.    Follow-Up:  Your physician recommends that you schedule a follow-up appointment AFTER ECHO COMPLETE  If you need a refill on your cardiac medications before your next appointment, please call your pharmacy.

## 2018-05-16 NOTE — Addendum Note (Signed)
Addended by: Freddi StarrMATHIS, Haly Feher W on: 05/16/2018 04:26 PM   Modules accepted: Orders

## 2018-05-21 ENCOUNTER — Ambulatory Visit (HOSPITAL_COMMUNITY): Payer: Medicare Other | Attending: Cardiology

## 2018-05-21 ENCOUNTER — Other Ambulatory Visit: Payer: Self-pay

## 2018-05-21 DIAGNOSIS — I350 Nonrheumatic aortic (valve) stenosis: Secondary | ICD-10-CM

## 2018-05-21 DIAGNOSIS — I10 Essential (primary) hypertension: Secondary | ICD-10-CM | POA: Diagnosis not present

## 2018-05-21 DIAGNOSIS — I082 Rheumatic disorders of both aortic and tricuspid valves: Secondary | ICD-10-CM | POA: Insufficient documentation

## 2018-05-21 DIAGNOSIS — E785 Hyperlipidemia, unspecified: Secondary | ICD-10-CM | POA: Insufficient documentation

## 2018-05-21 DIAGNOSIS — R0600 Dyspnea, unspecified: Secondary | ICD-10-CM | POA: Diagnosis not present

## 2018-05-26 ENCOUNTER — Other Ambulatory Visit: Payer: Self-pay | Admitting: Endocrinology

## 2018-05-28 NOTE — H&P (View-Only) (Signed)
HPI: FU AS. Patient has a history of rheumatic fever by her report. Nuclear study May 2017 showed no ischemia or infarction. Ejection fraction 54%. Carotid Dopplers May 2017 showed 1-39% bilateral stenosis.Patient had follow-up echocardiogram August 2019 secondary to worsening dyspnea.  Vigorous LV function noted with severe aortic stenosis (mean gradient 45 mmHg) and mild aortic insufficiency.  There was mild right atrial enlargement and moderate tricuspid regurgitation.  Since last seenshe has some dyspnea on exertion but no orthopnea, PND, pedal edema, chest pain or syncope.  Current Outpatient Medications  Medication Sig Dispense Refill  . amLODipine (NORVASC) 5 MG tablet Take 1 tablet (5 mg total) by mouth daily. 90 tablet 3  . Ascorbic Acid (VITAMIN C) 1000 MG tablet Take 1,000 mg by mouth daily.    Marland Kitchen aspirin 81 MG tablet Take 81 mg by mouth daily.    . Blood Glucose Monitoring Suppl (ONETOUCH VERIO IQ SYSTEM) w/Device KIT USE TO CHECK BLOOD SUGAR ONCE DAILY Dx code E11.9 1 kit 2  . Cholecalciferol (VITAMIN D) 2000 UNITS tablet Take 2,000 Units by mouth daily.    Marland Kitchen glipiZIDE (GLUCOTROL XL) 2.5 MG 24 hr tablet TAKE 1 TABLET BY MOUTH ONCE DAILY WITH BREAKFAST 90 tablet 0  . metFORMIN (GLUCOPHAGE-XR) 500 MG 24 hr tablet TAKE 2 TABLETS BY MOUTH ONCE DAILY WITH SUPPER. 60 tablet 3  . Multiple Vitamins-Minerals (MULTIVITAMIN PO) Take 1 tablet by mouth daily.    [place] DELICA LANCETS 96E MISC USE TO CHECK BLOOD SUGAR ONCE DAILY Dx code E11.9 100 each 2  . ONETOUCH VERIO test strip USE 1 STRIP TO CHECK GLUCOSE ONCE DAILY 50 each 4  . pramipexole (MIRAPEX) 0.25 MG tablet Take 0.25 mg by mouth daily.    . rosuvastatin (CRESTOR) 20 MG tablet Take 1 tablet (20 mg total) by mouth daily. 90 tablet 3  . sertraline (ZOLOFT) 25 MG tablet Take 25 mg by mouth daily.    . vitamin B-12 (CYANOCOBALAMIN) 1000 MCG tablet Take 1,000 mcg by mouth daily.     No current facility-administered medications  for this visit.      Past Medical History:  Diagnosis Date  . Anemia   . Cervical myelopathy (HCC)    s/p cervical fusion @ [facility]  . CKD (chronic kidney disease), stage III ([place])   . Decreased pedal pulses    normal ABI 02/03/13  . Diabetes mellitus ([place])   . Glaucoma   . HTN (hypertension)   . Hyperlipemia   . Hypothyroid    When pregnant  . Rheumatic fever     Past Surgical History:  Procedure Laterality Date  . APPENDECTOMY    . NECK SURGERY    . TONSILLECTOMY    . TUBAL LIGATION    . VAGINAL HYSTERECTOMY      Social History   Socioeconomic History  . Marital status: Married    Spouse name: Not on file  . Number of children: 2  . Years of education: Not on file  . Highest education level: Not on file  Occupational History  . Not on file  Social Needs  . Financial resource strain: Not on file  . Food insecurity:    Worry: Not on file    Inability: Not on file  . Transportation needs:    Medical: Not on file    Non-medical: Not on file  Tobacco Use  . Smoking status: Never Smoker  . Smokeless tobacco: Never Used  Substance and Sexual  Activity  . Alcohol use: No    Alcohol/week: 0.0 standard drinks  . Drug use: Not on file  . Sexual activity: Not on file  Lifestyle  . Physical activity:    Days per week: Not on file    Minutes per session: Not on file  . Stress: Not on file  Relationships  . Social connections:    Talks on phone: Not on file    Gets together: Not on file    Attends religious service: Not on file    Active member of club or organization: Not on file    Attends meetings of clubs or organizations: Not on file    Relationship status: Not on file  . Intimate partner violence:    Fear of current or ex partner: Not on file    Emotionally abused: Not on file    Physically abused: Not on file    Forced sexual activity: Not on file  Other Topics Concern  . Not on file  Social History Narrative  . Not on file    Family History   Problem Relation Age of Onset  . Diabetes Mother   . Cancer Mother        Colon  . Diabetes Sister   . Diabetes Paternal Grandmother   . Breast cancer Maternal Grandmother 75    ROS: no fevers or chills, productive cough, hemoptysis, dysphasia, odynophagia, melena, hematochezia, dysuria, hematuria, rash, seizure activity, orthopnea, PND, pedal edema, claudication. Remaining systems are negative.  Physical Exam: Well-developed obese in no acute distress.  Skin is warm and dry.  HEENT is normal.  Neck is supple.  Chest is clear to auscultation with normal expansion.  Cardiovascular exam is regular rate and rhythm.  3/6 systolic murmur left sternal border. Abdominal exam nontender or distended. No masses palpated. Extremities show trace edema. neuro grossly intact  A/P  1 aortic stenosis-patient has worsening dyspnea on exertion over the past several months.  Repeat echocardiogram shows severe aortic stenosis.  Patient will need aortic valve replacement.  I will arrange a right and left cardiac catheterization prior to the procedure.  The risks and benefits including myocardial infarction, CVA, death and worsening renal insufficiency discussed and she agrees to proceed.  If she has significant coronary disease she may require coronary artery bypass and graft/aortic valve replacement.  If no coronary disease she might be a better candidate for TAVR.  2 hypertension-blood pressure borderline.  I will not advance medicines given severe aortic stenosis.  3 hyperlipidemia-continue statin.  4 obesity-we again discussed the importance of weight loss.  5 diabetes mellitus-we will hold Glucophage for 48 hours following procedure.  Otherwise management per primary care.  Kirk Ruths, MD

## 2018-05-28 NOTE — Progress Notes (Signed)
HPI: FU AS. Patient has a history of rheumatic fever by her report. Nuclear study May 2017 showed no ischemia or infarction. Ejection fraction 54%. Carotid Dopplers May 2017 showed 1-39% bilateral stenosis.Patient had follow-up echocardiogram August 2019 secondary to worsening dyspnea.  Vigorous LV function noted with severe aortic stenosis (mean gradient 45 mmHg) and mild aortic insufficiency.  There was mild right atrial enlargement and moderate tricuspid regurgitation.  Since last seenshe has some dyspnea on exertion but no orthopnea, PND, pedal edema, chest pain or syncope.  Current Outpatient Medications  Medication Sig Dispense Refill  . amLODipine (NORVASC) 5 MG tablet Take 1 tablet (5 mg total) by mouth daily. 90 tablet 3  . Ascorbic Acid (VITAMIN C) 1000 MG tablet Take 1,000 mg by mouth daily.    Marland Kitchen aspirin 81 MG tablet Take 81 mg by mouth daily.    . Blood Glucose Monitoring Suppl (ONETOUCH VERIO IQ SYSTEM) w/Device KIT USE TO CHECK BLOOD SUGAR ONCE DAILY Dx code E11.9 1 kit 2  . Cholecalciferol (VITAMIN D) 2000 UNITS tablet Take 2,000 Units by mouth daily.    Marland Kitchen glipiZIDE (GLUCOTROL XL) 2.5 MG 24 hr tablet TAKE 1 TABLET BY MOUTH ONCE DAILY WITH BREAKFAST 90 tablet 0  . metFORMIN (GLUCOPHAGE-XR) 500 MG 24 hr tablet TAKE 2 TABLETS BY MOUTH ONCE DAILY WITH SUPPER. 60 tablet 3  . Multiple Vitamins-Minerals (MULTIVITAMIN PO) Take 1 tablet by mouth daily.    Glory Rosebush DELICA LANCETS 64Q MISC USE TO CHECK BLOOD SUGAR ONCE DAILY Dx code E11.9 100 each 2  . ONETOUCH VERIO test strip USE 1 STRIP TO CHECK GLUCOSE ONCE DAILY 50 each 4  . pramipexole (MIRAPEX) 0.25 MG tablet Take 0.25 mg by mouth daily.    . rosuvastatin (CRESTOR) 20 MG tablet Take 1 tablet (20 mg total) by mouth daily. 90 tablet 3  . sertraline (ZOLOFT) 25 MG tablet Take 25 mg by mouth daily.    . vitamin B-12 (CYANOCOBALAMIN) 1000 MCG tablet Take 1,000 mcg by mouth daily.     No current facility-administered medications  for this visit.      Past Medical History:  Diagnosis Date  . Anemia   . Cervical myelopathy (HCC)    s/p cervical fusion @ [facility]  . CKD (chronic kidney disease), stage III ([place])   . Decreased pedal pulses    normal ABI 02/03/13  . Diabetes mellitus ([place])   . Glaucoma   . HTN (hypertension)   . Hyperlipemia   . Hypothyroid    When pregnant  . Rheumatic fever     Past Surgical History:  Procedure Laterality Date  . APPENDECTOMY    . NECK SURGERY    . TONSILLECTOMY    . TUBAL LIGATION    . VAGINAL HYSTERECTOMY      Social History   Socioeconomic History  . Marital status: Married    Spouse name: Not on file  . Number of children: 2  . Years of education: Not on file  . Highest education level: Not on file  Occupational History  . Not on file  Social Needs  . Financial resource strain: Not on file  . Food insecurity:    Worry: Not on file    Inability: Not on file  . Transportation needs:    Medical: Not on file    Non-medical: Not on file  Tobacco Use  . Smoking status: Never Smoker  . Smokeless tobacco: Never Used  Substance and Sexual  Activity  . Alcohol use: No    Alcohol/week: 0.0 standard drinks  . Drug use: Not on file  . Sexual activity: Not on file  Lifestyle  . Physical activity:    Days per week: Not on file    Minutes per session: Not on file  . Stress: Not on file  Relationships  . Social connections:    Talks on phone: Not on file    Gets together: Not on file    Attends religious service: Not on file    Active member of club or organization: Not on file    Attends meetings of clubs or organizations: Not on file    Relationship status: Not on file  . Intimate partner violence:    Fear of current or ex partner: Not on file    Emotionally abused: Not on file    Physically abused: Not on file    Forced sexual activity: Not on file  Other Topics Concern  . Not on file  Social History Narrative  . Not on file    Family History   Problem Relation Age of Onset  . Diabetes Mother   . Cancer Mother        Colon  . Diabetes Sister   . Diabetes Paternal Grandmother   . Breast cancer Maternal Grandmother 75    ROS: no fevers or chills, productive cough, hemoptysis, dysphasia, odynophagia, melena, hematochezia, dysuria, hematuria, rash, seizure activity, orthopnea, PND, pedal edema, claudication. Remaining systems are negative.  Physical Exam: Well-developed obese in no acute distress.  Skin is warm and dry.  HEENT is normal.  Neck is supple.  Chest is clear to auscultation with normal expansion.  Cardiovascular exam is regular rate and rhythm.  3/6 systolic murmur left sternal border. Abdominal exam nontender or distended. No masses palpated. Extremities show trace edema. neuro grossly intact  A/P  1 aortic stenosis-patient has worsening dyspnea on exertion over the past several months.  Repeat echocardiogram shows severe aortic stenosis.  Patient will need aortic valve replacement.  I will arrange a right and left cardiac catheterization prior to the procedure.  The risks and benefits including myocardial infarction, CVA, death and worsening renal insufficiency discussed and she agrees to proceed.  If she has significant coronary disease she may require coronary artery bypass and graft/aortic valve replacement.  If no coronary disease she might be a better candidate for TAVR.  2 hypertension-blood pressure borderline.  I will not advance medicines given severe aortic stenosis.  3 hyperlipidemia-continue statin.  4 obesity-we again discussed the importance of weight loss.  5 diabetes mellitus-we will hold Glucophage for 48 hours following procedure.  Otherwise management per primary care.  Kirk Ruths, MD

## 2018-06-02 ENCOUNTER — Encounter: Payer: Self-pay | Admitting: Cardiology

## 2018-06-02 ENCOUNTER — Ambulatory Visit (INDEPENDENT_AMBULATORY_CARE_PROVIDER_SITE_OTHER): Payer: Medicare Other | Admitting: Cardiology

## 2018-06-02 ENCOUNTER — Other Ambulatory Visit: Payer: Self-pay | Admitting: *Deleted

## 2018-06-02 VITALS — BP 142/76 | HR 63 | Ht 60.0 in | Wt 197.0 lb

## 2018-06-02 DIAGNOSIS — I10 Essential (primary) hypertension: Secondary | ICD-10-CM | POA: Diagnosis not present

## 2018-06-02 DIAGNOSIS — E7800 Pure hypercholesterolemia, unspecified: Secondary | ICD-10-CM

## 2018-06-02 DIAGNOSIS — I350 Nonrheumatic aortic (valve) stenosis: Secondary | ICD-10-CM

## 2018-06-02 NOTE — Patient Instructions (Signed)
    [facility] [address] [place] [address] Dept: [phone] Loc: [phone]  Shannon MacleodDoris S Kidd  06/02/2018  You are scheduled for a Cardiac Catheterization on Wednesday, August 28 with Dr. Tonny BollmanMichael Kidd.  1. [facility] : [address] [place], [place] [address] at 9:30 AM (This time is two hours before your procedure to ensure your preparation). Free valet parking service is available.   Special note: Every effort is made to have your procedure done on time. Please understand that emergencies sometimes delay scheduled procedures.  2. Diet: Do not eat solid foods after midnight.  The patient may have clear liquids until 5am upon the day of the procedure.  3. Labs: You will need to have blood drawn on TODAY You do not need to be fasting.  4. Medication instructions in preparation for your procedure:  DO NOT TAKE GLIPIZIDE Wednesday MORNING  DO NOT TAKE METFORMIN Wednesday OR Thursday OR Friday = RESTART SATURDAY  On the morning of your procedure, take your Aspirin and any morning medicines NOT listed above.  You may use sips of water.  5. Plan for one night stay--bring personal belongings. 6. Bring a current list of your medications and current insurance cards. 7. You MUST have a responsible person to drive you home. 8. Someone MUST be with you the first 24 hours after you arrive home or your discharge will be delayed. 9. Please wear clothes that are easy to get on and off and wear slip-on shoes.  Thank you for allowing us to care for you!   -- [facility]

## 2018-06-03 ENCOUNTER — Telehealth: Payer: Self-pay | Admitting: *Deleted

## 2018-06-03 LAB — CBC
Hematocrit: 35.7 % (ref 34.0–46.6)
Hemoglobin: 11.5 g/dL (ref 11.1–15.9)
MCH: 28.3 pg (ref 26.6–33.0)
MCHC: 32.2 g/dL (ref 31.5–35.7)
MCV: 88 fL (ref 79–97)
Platelets: 278 10*3/uL (ref 150–450)
RBC: 4.06 x10E6/uL (ref 3.77–5.28)
RDW: 13.4 % (ref 12.3–15.4)
WBC: 9 10*3/uL (ref 3.4–10.8)

## 2018-06-03 LAB — BASIC METABOLIC PANEL WITH GFR
BUN/Creatinine Ratio: 16 (ref 12–28)
BUN: 19 mg/dL (ref 8–27)
CO2: 26 mmol/L (ref 20–29)
Calcium: 9.4 mg/dL (ref 8.7–10.3)
Chloride: 99 mmol/L (ref 96–106)
Creatinine, Ser: 1.17 mg/dL — ABNORMAL HIGH (ref 0.57–1.00)
GFR calc Af Amer: 54 mL/min/{1.73_m2} — ABNORMAL LOW
GFR calc non Af Amer: 47 mL/min/{1.73_m2} — ABNORMAL LOW
Glucose: 162 mg/dL — ABNORMAL HIGH (ref 65–99)
Potassium: 4.9 mmol/L (ref 3.5–5.2)
Sodium: 140 mmol/L (ref 134–144)

## 2018-06-03 NOTE — Telephone Encounter (Signed)
Pt [facility] for: Wednesday June 04, 2018 11:30 AM Verified [facility] and [facility] at: 9 AM  No solid food after midnight prior to cath, clear liquids until 5 AM day of procedure.  Hold: Glipizide AM of procedure. Metformin-day of procedure and 48 hours post procedure.  Except hold medications AM meds can be  taken pre-cath with sip of water including: ASA 81 mg  Confirmed patient has responsible person to drive home post procedure and for 24 hours after you arrive home: yes

## 2018-06-04 ENCOUNTER — Ambulatory Visit (HOSPITAL_COMMUNITY)
Admission: RE | Admit: 2018-06-04 | Discharge: 2018-06-04 | Disposition: A | Discharge status: Home / Self Care | Payer: Medicare Other | Source: Ambulatory Visit | Attending: Cardiovascular Disease | Admitting: Cardiovascular Disease

## 2018-06-04 ENCOUNTER — Encounter (HOSPITAL_COMMUNITY)
Admission: RE | Disposition: A | Discharge status: Home / Self Care | Payer: Self-pay | Source: Ambulatory Visit | Attending: Cardiovascular Disease

## 2018-06-04 ENCOUNTER — Encounter (HOSPITAL_COMMUNITY): Payer: Self-pay | Admitting: Cardiovascular Disease

## 2018-06-04 ENCOUNTER — Other Ambulatory Visit: Payer: Self-pay

## 2018-06-04 DIAGNOSIS — I272 Pulmonary hypertension, unspecified: Secondary | ICD-10-CM | POA: Insufficient documentation

## 2018-06-04 DIAGNOSIS — E039 Hypothyroidism, unspecified: Secondary | ICD-10-CM | POA: Diagnosis not present

## 2018-06-04 DIAGNOSIS — Z833 Family history of diabetes mellitus: Secondary | ICD-10-CM | POA: Diagnosis not present

## 2018-06-04 DIAGNOSIS — I2584 Coronary atherosclerosis due to calcified coronary lesion: Secondary | ICD-10-CM | POA: Insufficient documentation

## 2018-06-04 DIAGNOSIS — H409 Unspecified glaucoma: Secondary | ICD-10-CM | POA: Insufficient documentation

## 2018-06-04 DIAGNOSIS — N183 Chronic kidney disease, stage 3 (moderate): Secondary | ICD-10-CM | POA: Insufficient documentation

## 2018-06-04 DIAGNOSIS — Z90710 Acquired absence of both cervix and uterus: Secondary | ICD-10-CM | POA: Diagnosis not present

## 2018-06-04 DIAGNOSIS — E785 Hyperlipidemia, unspecified: Secondary | ICD-10-CM | POA: Insufficient documentation

## 2018-06-04 DIAGNOSIS — Z79899 Other long term (current) drug therapy: Secondary | ICD-10-CM | POA: Diagnosis not present

## 2018-06-04 DIAGNOSIS — Z7982 Long term (current) use of aspirin: Secondary | ICD-10-CM | POA: Insufficient documentation

## 2018-06-04 DIAGNOSIS — I129 Hypertensive chronic kidney disease with stage 1 through stage 4 chronic kidney disease, or unspecified chronic kidney disease: Secondary | ICD-10-CM | POA: Insufficient documentation

## 2018-06-04 DIAGNOSIS — Z981 Arthrodesis status: Secondary | ICD-10-CM | POA: Insufficient documentation

## 2018-06-04 DIAGNOSIS — R0600 Dyspnea, unspecified: Secondary | ICD-10-CM

## 2018-06-04 DIAGNOSIS — E669 Obesity, unspecified: Secondary | ICD-10-CM | POA: Insufficient documentation

## 2018-06-04 DIAGNOSIS — I350 Nonrheumatic aortic (valve) stenosis: Secondary | ICD-10-CM

## 2018-06-04 DIAGNOSIS — Z7984 Long term (current) use of oral hypoglycemic drugs: Secondary | ICD-10-CM | POA: Insufficient documentation

## 2018-06-04 DIAGNOSIS — I2510 Atherosclerotic heart disease of native coronary artery without angina pectoris: Secondary | ICD-10-CM | POA: Insufficient documentation

## 2018-06-04 DIAGNOSIS — Z9889 Other specified postprocedural states: Secondary | ICD-10-CM | POA: Diagnosis not present

## 2018-06-04 DIAGNOSIS — E1122 Type 2 diabetes mellitus with diabetic chronic kidney disease: Secondary | ICD-10-CM | POA: Diagnosis not present

## 2018-06-04 HISTORY — PX: RIGHT/LEFT HEART CATH AND CORONARY ANGIOGRAPHY: CATH118266

## 2018-06-04 LAB — POCT I-STAT 3, VENOUS BLOOD GAS (G3P V)
Acid-Base Excess: 1 mmol/L (ref 0.0–2.0)
Acid-Base Excess: 2 mmol/L (ref 0.0–2.0)
Bicarbonate: 27.8 mmol/L (ref 20.0–28.0)
Bicarbonate: 28.2 mmol/L — ABNORMAL HIGH (ref 20.0–28.0)
Bicarbonate: 29.2 mmol/L — ABNORMAL HIGH (ref 20.0–28.0)
O2 Saturation: 85 %
O2 Saturation: 86 %
O2 Saturation: 86 %
TCO2: 30 mmol/L (ref 22–32)
TCO2: 30 mmol/L (ref 22–32)
TCO2: 31 mmol/L (ref 22–32)
pCO2, Ven: 58.8 mmHg (ref 44.0–60.0)
pCO2, Ven: 60.2 mmHg — ABNORMAL HIGH (ref 44.0–60.0)
pCO2, Ven: 60.8 mmHg — ABNORMAL HIGH (ref 44.0–60.0)
pH, Ven: 7.279 (ref 7.250–7.430)
pH, Ven: 7.283 (ref 7.250–7.430)
pH, Ven: 7.29 (ref 7.250–7.430)
pO2, Ven: 58 mmHg — ABNORMAL HIGH (ref 32.0–45.0)
pO2, Ven: 59 mmHg — ABNORMAL HIGH (ref 32.0–45.0)
pO2, Ven: 60 mmHg — ABNORMAL HIGH (ref 32.0–45.0)

## 2018-06-04 LAB — POCT I-STAT 3, ART BLOOD GAS (G3+)
Acid-Base Excess: 3 mmol/L — ABNORMAL HIGH (ref 0.0–2.0)
Bicarbonate: 30.7 mmol/L — ABNORMAL HIGH (ref 20.0–28.0)
O2 Saturation: 100 %
TCO2: 33 mmol/L — ABNORMAL HIGH (ref 22–32)
pCO2 arterial: 61.5 mmHg — ABNORMAL HIGH (ref 32.0–48.0)
pH, Arterial: 7.306 — ABNORMAL LOW (ref 7.350–7.450)
pO2, Arterial: 468 mmHg — ABNORMAL HIGH (ref 83.0–108.0)

## 2018-06-04 LAB — GLUCOSE, CAPILLARY: Glucose-Capillary: 120 mg/dL — ABNORMAL HIGH (ref 70–99)

## 2018-06-04 SURGERY — RIGHT/LEFT HEART CATH AND CORONARY ANGIOGRAPHY
Anesthesia: LOCAL

## 2018-06-04 MED ORDER — IOHEXOL 350 MG/ML SOLN
INTRAVENOUS | Status: DC | PRN
  Administered 2018-06-04: 40 mL via INTRA_ARTERIAL

## 2018-06-04 MED ORDER — MIDAZOLAM HCL 2 MG/2ML IJ SOLN
INTRAMUSCULAR | Status: DC | PRN
  Administered 2018-06-04: 2 mg via INTRAVENOUS

## 2018-06-04 MED ORDER — LIDOCAINE HCL (PF) 1 % IJ SOLN
INTRAMUSCULAR | Status: DC | PRN
  Administered 2018-06-04: 2 mL via INTRADERMAL

## 2018-06-04 MED ORDER — HEPARIN SODIUM (PORCINE) 1000 UNIT/ML IJ SOLN
INTRAMUSCULAR | Status: DC | PRN
  Administered 2018-06-04: 4500 [IU] via INTRAVENOUS

## 2018-06-04 MED ORDER — VERAPAMIL HCL 2.5 MG/ML IV SOLN
INTRAVENOUS | Status: AC
  Filled 2018-06-04: qty 2

## 2018-06-04 MED ORDER — SODIUM CHLORIDE 0.9 % IV SOLN: 250.0000 mL | INTRAVENOUS | Status: DC | PRN

## 2018-06-04 MED ORDER — SODIUM CHLORIDE 0.9% FLUSH: 3.0000 mL | Freq: Two times a day (BID) | INTRAVENOUS | Status: DC

## 2018-06-04 MED ORDER — SODIUM CHLORIDE 0.9% FLUSH: 3.0000 mL | INTRAVENOUS | Status: DC | PRN

## 2018-06-04 MED ORDER — SODIUM CHLORIDE 0.9 % WEIGHT BASED INFUSION: 1.0000 mL/kg/h | INTRAVENOUS | Status: DC

## 2018-06-04 MED ORDER — HEPARIN (PORCINE) IN NACL 1000-0.9 UT/500ML-% IV SOLN
INTRAVENOUS | Status: DC | PRN
  Administered 2018-06-04: 500 mL

## 2018-06-04 MED ORDER — HEPARIN SODIUM (PORCINE) 1000 UNIT/ML IJ SOLN
INTRAMUSCULAR | Status: AC
  Filled 2018-06-04: qty 1

## 2018-06-04 MED ORDER — ACETAMINOPHEN 325 MG PO TABS: 650.0000 mg | ORAL_TABLET | ORAL | Status: DC | PRN

## 2018-06-04 MED ORDER — ASPIRIN 81 MG PO CHEW: 81.0000 mg | CHEWABLE_TABLET | ORAL | Status: DC

## 2018-06-04 MED ORDER — MIDAZOLAM HCL 2 MG/2ML IJ SOLN
INTRAMUSCULAR | Status: AC
  Filled 2018-06-04: qty 2

## 2018-06-04 MED ORDER — FENTANYL CITRATE (PF) 100 MCG/2ML IJ SOLN
INTRAMUSCULAR | Status: AC
  Filled 2018-06-04: qty 2

## 2018-06-04 MED ORDER — ONDANSETRON HCL 4 MG/2ML IJ SOLN: 4.0000 mg | Freq: Four times a day (QID) | INTRAMUSCULAR | Status: DC | PRN

## 2018-06-04 MED ORDER — SODIUM CHLORIDE 0.9 % WEIGHT BASED INFUSION
3.0000 mL/kg/h | INTRAVENOUS | Status: AC
  Administered 2018-06-04: 3 mL/kg/h via INTRAVENOUS

## 2018-06-04 MED ORDER — FENTANYL CITRATE (PF) 100 MCG/2ML IJ SOLN
INTRAMUSCULAR | Status: DC | PRN
  Administered 2018-06-04: 25 ug via INTRAVENOUS

## 2018-06-04 MED ORDER — SODIUM CHLORIDE 0.9 % IV SOLN: INTRAVENOUS | Status: DC

## 2018-06-04 SURGICAL SUPPLY — 12 items
CATH BALLN WEDGE 5F 110CM (CATHETERS) ×2
CATH INFINITI 5 FR JL3.5 (CATHETERS) ×2
CATH INFINITI JR4 5F (CATHETERS) ×2
DEVICE RAD COMP TR BAND LRG (VASCULAR PRODUCTS) ×2
GLIDESHEATH SLEND SS 6F .021 (SHEATH) ×2
INQWIRE 1.5J .035X260CM (WIRE) ×2
KIT HEART LEFT (KITS) ×2
PACK CARDIAC CATHETERIZATION (CUSTOM PROCEDURE TRAY) ×2
SHEATH GLIDE SLENDER 4/5FR (SHEATH) ×2
TRANSDUCER W/STOPCOCK (MISCELLANEOUS) ×2
TUBING CIL FLEX 10 FLL-RA (TUBING) ×2
WIRE HI TORQ VERSACORE-J 145CM (WIRE) ×2

## 2018-06-04 NOTE — Research (Signed)
CADFEM Informed Consent   Subject Name: Shannon Kidd  Subject met inclusion and exclusion criteria.  The informed consent form, study requirements and expectations were reviewed with the subject and questions and concerns were addressed prior to the signing of the consent form.  The subject verbalized understanding of the trail requirements.  The subject agreed to participate in the CADFEM trial and signed the informed consent.  The informed consent was obtained prior to performance of any protocol-specific procedures for the subject.  A copy of the signed informed consent was given to the subject and a copy was placed in the subject's medical record.  [name] 06/04/2018, 10:36 AM

## 2018-06-04 NOTE — Discharge Instructions (Signed)

## 2018-06-04 NOTE — Interval H&P Note (Signed)
History and Physical Interval Note:  06/04/2018 11:48 AM  Shannon Kidd  has presented today for surgery, with the diagnosis of as  The various methods of treatment have been discussed with the patient and family. After consideration of risks, benefits and other options for treatment, the patient has consented to  Procedure(s): RIGHT/LEFT HEART CATH AND CORONARY ANGIOGRAPHY (N/A) as a surgical intervention .  The patient's history has been reviewed, patient examined, no change in status, stable for surgery.  I have reviewed the patient's chart and labs.  Questions were answered to the patient's satisfaction.     Tonny BollmanMichael Reygan Heagle

## 2018-06-25 ENCOUNTER — Ambulatory Visit (HOSPITAL_BASED_OUTPATIENT_CLINIC_OR_DEPARTMENT_OTHER)
Admission: RE | Admit: 2018-06-25 | Discharge: 2018-06-25 | Disposition: A | Discharge status: Home / Self Care | Payer: Medicare Other | Source: Ambulatory Visit | Attending: Cardiovascular Disease | Admitting: Cardiovascular Disease

## 2018-06-25 ENCOUNTER — Ambulatory Visit (HOSPITAL_COMMUNITY)
Admission: RE | Admit: 2018-06-25 | Discharge: 2018-06-25 | Disposition: A | Discharge status: Home / Self Care | Payer: Medicare Other | Source: Ambulatory Visit | Attending: Cardiovascular Disease | Admitting: Cardiovascular Disease

## 2018-06-25 ENCOUNTER — Ambulatory Visit (HOSPITAL_COMMUNITY): Payer: Medicare Other

## 2018-06-25 DIAGNOSIS — R9389 Abnormal findings on diagnostic imaging of other specified body structures: Secondary | ICD-10-CM | POA: Insufficient documentation

## 2018-06-25 DIAGNOSIS — K8020 Calculus of gallbladder without cholecystitis without obstruction: Secondary | ICD-10-CM | POA: Insufficient documentation

## 2018-06-25 DIAGNOSIS — R0600 Dyspnea, unspecified: Secondary | ICD-10-CM

## 2018-06-25 DIAGNOSIS — K5730 Diverticulosis of large intestine without perforation or abscess without bleeding: Secondary | ICD-10-CM | POA: Insufficient documentation

## 2018-06-25 DIAGNOSIS — I350 Nonrheumatic aortic (valve) stenosis: Secondary | ICD-10-CM

## 2018-06-25 DIAGNOSIS — J449 Chronic obstructive pulmonary disease, unspecified: Secondary | ICD-10-CM | POA: Diagnosis not present

## 2018-06-25 DIAGNOSIS — I2510 Atherosclerotic heart disease of native coronary artery without angina pectoris: Secondary | ICD-10-CM | POA: Insufficient documentation

## 2018-06-25 DIAGNOSIS — I700 Atherosclerosis of aorta: Secondary | ICD-10-CM | POA: Insufficient documentation

## 2018-06-25 DIAGNOSIS — I6523 Occlusion and stenosis of bilateral carotid arteries: Secondary | ICD-10-CM | POA: Insufficient documentation

## 2018-06-25 LAB — PULMONARY FUNCTION TEST
DL/VA % pred: 145 %
DL/VA: 6.15 ml/min/mmHg/L
DLCO unc % pred: 88 %
DLCO unc: 16.78 ml/min/mmHg
FEF 25-75 Post: 0.96 L/s
FEF 25-75 Pre: 0.64 L/s
FEF2575-%Change-Post: 49 %
FEF2575-%Pred-Post: 60 %
FEF2575-%Pred-Pre: 40 %
FEV1-%Change-Post: 13 %
FEV1-%Pred-Post: 60 %
FEV1-%Pred-Pre: 53 %
FEV1-Post: 1.11 L
FEV1-Pre: 0.98 L
FEV1FVC-%Change-Post: 2 %
FEV1FVC-%Pred-Pre: 95 %
FEV6-%Change-Post: 10 %
FEV6-%Pred-Post: 65 %
FEV6-%Pred-Pre: 59 %
FEV6-Post: 1.53 L
FEV6-Pre: 1.38 L
FEV6FVC-%Pred-Post: 105 %
FEV6FVC-%Pred-Pre: 105 %
FVC-%Change-Post: 10 %
FVC-%Pred-Post: 62 %
FVC-%Pred-Pre: 56 %
FVC-Post: 1.53 L
FVC-Pre: 1.38 L
Post FEV1/FVC ratio: 73 %
Post FEV6/FVC ratio: 100 %
Pre FEV1/FVC ratio: 71 %
Pre FEV6/FVC Ratio: 100 %
RV % pred: 99 %
RV: 2.03 L
TLC % pred: 80 %
TLC: 3.56 L

## 2018-06-25 MED ORDER — IOPAMIDOL (ISOVUE-370) INJECTION 76%
100.0000 mL | Freq: Once | INTRAVENOUS | Status: AC | PRN
  Administered 2018-06-25: 100 mL via INTRAVENOUS

## 2018-06-25 MED ORDER — ALBUTEROL SULFATE (2.5 MG/3ML) 0.083% IN NEBU
2.5000 mg | INHALATION_SOLUTION | Freq: Once | RESPIRATORY_TRACT | Status: AC
  Administered 2018-06-25: 2.5 mg via RESPIRATORY_TRACT

## 2018-06-25 NOTE — Progress Notes (Signed)
*  Preliminary Results* Carotid artery duplex has been completed. Right ICA: 40-59% stenosis. Left ICA: 1-39% Stenosis.  Vertebral arteries are patent with antegrade flow.  06/25/2018 4:10 PM  Shannon Kidd Clare Gandyiddle

## 2018-06-26 DIAGNOSIS — I350 Nonrheumatic aortic (valve) stenosis: Secondary | ICD-10-CM | POA: Diagnosis not present

## 2018-07-07 ENCOUNTER — Institutional Professional Consult (permissible substitution): Payer: Medicare Other | Admitting: Thoracic Surgery (Cardiothoracic Vascular Surgery)

## 2018-07-07 ENCOUNTER — Other Ambulatory Visit: Payer: Self-pay

## 2018-07-07 ENCOUNTER — Encounter: Payer: Self-pay | Admitting: Thoracic Surgery (Cardiothoracic Vascular Surgery)

## 2018-07-07 ENCOUNTER — Ambulatory Visit: Payer: Medicare Other | Admitting: Physical Therapy

## 2018-07-07 VITALS — BP 134/64 | HR 67 | Resp 16 | Ht 60.0 in | Wt 200.0 lb

## 2018-07-07 DIAGNOSIS — I350 Nonrheumatic aortic (valve) stenosis: Secondary | ICD-10-CM

## 2018-07-07 DIAGNOSIS — I5032 Chronic diastolic (congestive) heart failure: Secondary | ICD-10-CM | POA: Diagnosis not present

## 2018-07-07 NOTE — Progress Notes (Signed)
[facility]  CARDIOTHORACIC SURGERY CONSULTATION REPORT  Referring Provider is Crenshaw, Denice Bors, MD PCP is Leighton Ruff, MD  Chief Complaint  Patient presents with  . Aortic Stenosis    TAVR EVAL...all studies completed..will have exercise eval later this morning    HPI:  [name] with known history of rheumatic fever during childhood, aortic stenosis, hypertension, type 2 diabetes with complications, and chronic kidney disease stage III who has been referred for surgical consultation to discuss treatment options for management of severe symptomatic aortic stenosis.  Patient states that she had Bright disease at age 25 and rheumatic fever at age 18.  She was noted to have a heart murmur during childhood but states that the heart murmur eventually resolved.  As she grew older her heart murmur became more pronounced and she was noted to have aortic stenosis.  For the last several years she has been followed by Dr. Stanford Breed with serial echocardiograms that have demonstrated normal left ventricular systolic function with aortic stenosis that has progressed in severity.  Beginning last March the patient began to experience progressive symptoms of exertional shortness of breath and fatigue.  Recent transthoracic echocardiogram documented the presence of severe aortic stenosis with peak velocity across the aortic valve measured 4.3 m/s corresponding to mean transvalvular gradient estimated 45 mmHg.  The DVI was reported 0.23 with aortic valve area calculated 0.72 cm.  Left ventricular systolic function remain normal with ejection fraction estimated 65%.  The patient was referred to the multidisciplinary heart valve clinic and underwent left and right heart catheterization by Dr. Burt Knack.  Catheterization revealed mild nonobstructive coronary artery disease and confirmed the presence of severe aortic stenosis with mean  transvalvular gradient measured 43 mmHg by catheterization.  There was moderate pulmonary hypertension.  Cardiothoracic surgical consultation was requested.  Patient is married and lives with her husband in an independent living senior community in [place].  She worked for many years for [employer] but has been retired for nearly 10 years.  She still drives an automobile.  She cares for her husband who has mild Alzheimer's dementia.  She also cares for her son who has above-the-knee amputation.  She has remained functionally independent although she does not exercise on a regular basis.  She reports that she is limited only by symptoms of exertional shortness of breath and fatigue.  Symptoms have progressed over the last several months such that the patient now gets short of breath with moderate level activity.  She denies any resting shortness of breath, PND, orthopnea, palpitations, or syncope.  She reports occasional exertional tightness across her chest associated with severe shortness of breath.  She reports occasional dizzy spells when she has been bent over for period of time.  Without exertional shortness of breath or fatigue her mobility is reasonably good.  Past Medical History:  Diagnosis Date  . Anemia   . Cervical myelopathy (HCC)    s/p cervical fusion @ [facility]  . CKD (chronic kidney disease), stage III ([place])   . Decreased pedal pulses    normal ABI 02/03/13  . Diabetes mellitus ([place])   . Glaucoma   . HTN (hypertension)   . Hyperlipemia   . Hypothyroid    When pregnant  . Rheumatic fever     Past Surgical History:  Procedure Laterality Date  . APPENDECTOMY    . NECK SURGERY    . RIGHT/LEFT HEART CATH AND CORONARY  ANGIOGRAPHY N/A 06/04/2018   Procedure: RIGHT/LEFT HEART CATH AND CORONARY ANGIOGRAPHY;  Surgeon: Sherren Mocha, MD;  Location: [facility];  Service: Cardiovascular;  Laterality: N/A;  . TONSILLECTOMY    . TUBAL LIGATION    . VAGINAL  HYSTERECTOMY      Family History  Problem Relation Age of Onset  . Diabetes Mother   . Cancer Mother        Colon  . Diabetes Sister   . Diabetes Paternal Grandmother   . Breast cancer Maternal Grandmother 75    Social History   Socioeconomic History  . Marital status: Married    Spouse name: Not on file  . Number of children: 2  . Years of education: Not on file  . Highest education level: Not on file  Occupational History  . Not on file  Social Needs  . Financial resource strain: Not on file  . Food insecurity:    Worry: Not on file    Inability: Not on file  . Transportation needs:    Medical: Not on file    Non-medical: Not on file  Tobacco Use  . Smoking status: Never Smoker  . Smokeless tobacco: Never Used  Substance and Sexual Activity  . Alcohol use: No    Alcohol/week: 0.0 standard drinks  . Drug use: Not on file  . Sexual activity: Not on file  Lifestyle  . Physical activity:    Days per week: Not on file    Minutes per session: Not on file  . Stress: Not on file  Relationships  . Social connections:    Talks on phone: Not on file    Gets together: Not on file    Attends religious service: Not on file    Active member of club or organization: Not on file    Attends meetings of clubs or organizations: Not on file    Relationship status: Not on file  . Intimate partner violence:    Fear of current or ex partner: Not on file    Emotionally abused: Not on file    Physically abused: Not on file    Forced sexual activity: Not on file  Other Topics Concern  . Not on file  Social History Narrative  . Not on file    Current Outpatient Medications  Medication Sig Dispense Refill  . albuterol (PROVENTIL HFA;VENTOLIN HFA) 108 (90 Base) MCG/ACT inhaler Inhale 2 puffs into the lungs every 6 (six) hours as needed for wheezing or shortness of breath.    Marland Kitchen amLODipine (NORVASC) 5 MG tablet Take 1 tablet (5 mg total) by mouth daily. 90 tablet 3  . Ascorbic  Acid (VITAMIN C) 1000 MG tablet Take 1,000 mg by mouth daily.    Marland Kitchen aspirin 81 MG tablet Take 81 mg by mouth daily.    . Blood Glucose Monitoring Suppl (ONETOUCH VERIO IQ SYSTEM) w/Device KIT USE TO CHECK BLOOD SUGAR ONCE DAILY Dx code E11.9 1 kit 2  . Cholecalciferol (VITAMIN D) 2000 UNITS tablet Take 2,000 Units by mouth daily.    Marland Kitchen glipiZIDE (GLUCOTROL XL) 2.5 MG 24 hr tablet TAKE 1 TABLET BY MOUTH ONCE DAILY WITH BREAKFAST (Patient taking differently: Take 2.5 mg by mouth daily with breakfast. ) 90 tablet 0  . metFORMIN (GLUCOPHAGE-XR) 500 MG 24 hr tablet TAKE 2 TABLETS BY MOUTH ONCE DAILY WITH SUPPER. (Patient taking differently: Take 1,000 mg by mouth daily after supper. ) 60 tablet 3  . ONETOUCH DELICA LANCETS 10X MISC  USE TO CHECK BLOOD SUGAR ONCE DAILY Dx code E11.9 100 each 2  . ONETOUCH VERIO test strip USE 1 STRIP TO CHECK GLUCOSE ONCE DAILY (Patient taking differently: 1 each by Other route daily. ) 50 each 4  . pramipexole (MIRAPEX) 0.25 MG tablet Take 0.25 mg by mouth at bedtime.     . rosuvastatin (CRESTOR) 20 MG tablet Take 1 tablet (20 mg total) by mouth daily. 90 tablet 3  . sertraline (ZOLOFT) 25 MG tablet Take 25 mg by mouth at bedtime.     . vitamin B-12 (CYANOCOBALAMIN) 1000 MCG tablet Take 1,000 mcg by mouth daily.     No current facility-administered medications for this visit.     Allergies  Allergen Reactions  . Actos [Pioglitazone] Swelling      Review of Systems:   General:  normal appetite, decreased energy, no weight gain, no weight loss, no fever  Cardiac:  + chest pain with exertion, no chest pain at rest, +SOB with exertion, no resting SOB, no PND, no orthopnea, no palpitations, no arrhythmia, no atrial fibrillation, + LE edema, + dizzy spells, no syncope  Respiratory:  + shortness of breath, no home oxygen, no productive cough, + dry cough, no bronchitis, no wheezing, no hemoptysis, no asthma, no pain with inspiration or cough, no sleep apnea, no CPAP at  night  GI:   no difficulty swallowing, no reflux, no frequent heartburn, no hiatal hernia, no abdominal pain, occasional constipation, occasional diarrhea, no hematochezia, no hematemesis, no melena  GU:   no dysuria,  no frequency, no urinary tract infection, no hematuria, no kidney stones, + kidney disease  Vascular:  no pain suggestive of claudication, no pain in feet, no leg cramps, no varicose veins, no DVT, no non-healing foot ulcer  Neuro:   no stroke, no TIA's, no seizures, no headaches, no temporary blindness one eye,  no slurred speech, no peripheral neuropathy, no chronic pain, no instability of gait, mild memory/cognitive dysfunction  Musculoskeletal: no arthritis, no joint swelling, no myalgias, no difficulty walking, normal mobility   Skin:   no rash, no itching, no skin infections, no pressure sores or ulcerations  Psych:   no anxiety, no depression, no nervousness, + unusual recent stress  Eyes:   no blurry vision, no floaters, no recent vision changes, does not wears glasses or contacts  ENT:   no hearing loss, no loose or painful teeth, full dentures  Hematologic:  no easy bruising, no abnormal bleeding, no clotting disorder, no frequent epistaxis  Endocrine:  + diabetes, does check CBG's at home           Physical Exam:   BP 134/64 (BP Location: Left Arm, Patient Position: Sitting, Cuff Size: Large)   Pulse 67   Resp 16   Ht 5' (1.524 m)   Wt 200 lb (90.7 kg)   SpO2 93%   BMI 39.06 kg/m   General:  Obese,  well-appearing  HEENT:  Unremarkable   Neck:   no JVD, no bruits, no adenopathy   Chest:   clear to auscultation, symmetrical breath sounds, no wheezes, no rhonchi   CV:   RRR, grade III/VI crescendo/decrescendo murmur heard best at LSB,  no diastolic murmur  Abdomen:  soft, non-tender, no masses   Extremities:  warm, well-perfused, pulses diminished but palpable, no LE edema  Rectal/GU  Deferred  Neuro:   Grossly non-focal and symmetrical  throughout  Skin:   Clean and dry, no rashes, no breakdown   Diagnostic  Tests:  Transthoracic Echocardiography  Patient:    Shannon Kidd, Shannon Kidd MR #:       [id number] Study Date: 05/21/2018 Gender:     F Age:        66 Height:     152.4 cm Weight:     91.3 kg BSA:        2.02 m^2 Pt. Status: Room:   ORDERING     Kirk Ruths  REFERRING    [facility]  ATTENDING    Candee Furbish, M.D.  REFERRING    Leighton Ruff [id number]  SONOGRAPHER  Marygrace Drought, RCS  PERFORMING   [facility], Outpatient  cc:  ------------------------------------------------------------------- LV EF: 65% -   70%  ------------------------------------------------------------------- Indications:      Aortic Valve Stenosis (I35.0).  ------------------------------------------------------------------- History:   PMH:  HLD, Rheumatic fever.  Dyspnea.  Risk factors: Hypertension.  ------------------------------------------------------------------- Study Conclusions  - Left ventricle: The cavity size was normal. Wall thickness was   increased in a pattern of mild LVH. Systolic function was   vigorous. The estimated ejection fraction was in the range of 65%   to 70%. Wall motion was normal; there were no regional wall   motion abnormalities. Doppler parameters are consistent with   abnormal left ventricular relaxation (grade 1 diastolic   dysfunction). Doppler parameters are consistent with high   ventricular filling pressure. - Aortic valve: Valve mobility was restricted. There was severe   stenosis. There was mild regurgitation. Peak velocity (S): 425   cm/s. Mean gradient (S): 45 mm Hg. Valve area (VTI): 0.76 cm^2.   Valve area (Vmax): 0.72 cm^2. Valve area (Vmean): 0.76 cm^2. - Mitral valve: Moderately calcified annulus. Valve area by   pressure half-time: 2.22 cm^2. Valve area by continuity equation   (using LVOT flow): 2.03 cm^2. - Right atrium: The atrium was mildly dilated. - Tricuspid valve:  There was moderate regurgitation directed   eccentrically. - Pulmonary arteries: Systolic pressure was mildly increased. PA   peak pressure: 36 mm Hg (S).  Impressions:  - Aortic stenosis has progressed when compared to prior.  ------------------------------------------------------------------- Study data:  Comparison was made to the study of 02/05/2017.  Study status:  Routine.  Procedure:  Transthoracic echocardiography. Image quality was adequate.          Transthoracic echocardiography.  M-mode, complete 2D, spectral Doppler, and color Doppler.  Birthdate:  Patient birthdate: [birth year].  Age:  Patient is 72 yr old.  Sex:  Gender: female.    BMI: 39.3 kg/m^2.  Blood pressure:     177/92  Patient status:  Outpatient.  Study date: Study date: 05/21/2018. Study time: 07:35 AM.  Location:  [facility]  -------------------------------------------------------------------  ------------------------------------------------------------------- Left ventricle:  The cavity size was normal. Wall thickness was increased in a pattern of mild LVH. Systolic function was vigorous. The estimated ejection fraction was in the range of 65% to 70%. Wall motion was normal; there were no regional wall motion abnormalities. Doppler parameters are consistent with abnormal left ventricular relaxation (grade 1 diastolic dysfunction). Doppler parameters are consistent with high ventricular filling pressure.   ------------------------------------------------------------------- Aortic valve:   Trileaflet; severely thickened, severely calcified leaflets. Valve mobility was restricted.  Doppler:   There was severe stenosis.   There was mild regurgitation.    VTI ratio of LVOT to aortic valve: 0.24. Valve area (VTI): 0.76 cm^2. Indexed valve area (VTI): 0.38 cm^2/m^2. Peak velocity ratio of LVOT to aortic valve: 0.23. Valve area (Vmax): 0.72 cm^2. Indexed valve  area (Vmax): 0.36 cm^2/m^2. Mean  velocity ratio of LVOT to aortic valve: 0.24. Valve area (Vmean): 0.76 cm^2. Indexed valve area (Vmean): 0.37 cm^2/m^2.    Mean gradient (S): 45 mm Hg. Peak gradient (S): 72 mm Hg.  ------------------------------------------------------------------- Aorta:  Aortic root: The aortic root was normal in size.  ------------------------------------------------------------------- Mitral valve:   Moderately calcified annulus. Mobility was not restricted.  Doppler:  Transvalvular velocity was within the normal range. There was no evidence for stenosis. There was no regurgitation.    Valve area by pressure half-time: 2.22 cm^2. Indexed valve area by pressure half-time: 1.1 cm^2/m^2. Valve area by continuity equation (using LVOT flow): 2.03 cm^2. Indexed valve area by continuity equation (using LVOT flow): 1.01 cm^2/m^2. Mean gradient (D): 2 mm Hg. Peak gradient (D): 5 mm Hg.  ------------------------------------------------------------------- Left atrium:  The atrium was normal in size.  ------------------------------------------------------------------- Right ventricle:  The cavity size was normal. Wall thickness was normal. Systolic function was normal.  ------------------------------------------------------------------- Pulmonic valve:    Doppler:  Transvalvular velocity was within the normal range. There was no evidence for stenosis.  ------------------------------------------------------------------- Tricuspid valve:   Structurally normal valve.    Doppler: Transvalvular velocity was within the normal range. There was moderate regurgitation directed eccentrically.  ------------------------------------------------------------------- Pulmonary artery:   The main pulmonary artery was normal-sized. Systolic pressure was mildly increased.  ------------------------------------------------------------------- Right atrium:  The atrium was mildly  dilated.  ------------------------------------------------------------------- Pericardium:  There was no pericardial effusion.  ------------------------------------------------------------------- Systemic veins: Inferior vena cava: The vessel was normal in size. The respirophasic diameter changes were in the normal range (= 50%), consistent with normal central venous pressure. Diameter: 21 mm.  ------------------------------------------------------------------- Measurements   IVC                                      Value          Reference  ID                                       21    mm       ----------    Left ventricle                           Value          Reference  LV ID, ED, PLAX chordal           (H)    52.8  mm       43 - 52  LV ID, ES, PLAX chordal                  31.4  mm       23 - 38  LV fx shortening, PLAX chordal           41    %        >=29  LV PW thickness, ED                      14.4  mm       ----------  IVS/LV PW ratio, ED                      0.59           <=  1.3  Stroke volume, 2D                        87    ml       ----------  Stroke volume/bsa, 2D                    43    ml/m^2   ----------  LV e&', lateral                           7.72  cm/s     ----------  LV E/e&', lateral                         14.64          ----------  LV e&', medial                            5.77  cm/s     ----------  LV E/e&', medial                          19.58          ----------  LV e&', average                           6.75  cm/s     ----------  LV E/e&', average                         16.75          ----------  Longitudinal strain, TDI                 22    %        ----------    Ventricular septum                       Value          Reference  IVS thickness, ED                        8.43  mm       ----------    LVOT                                     Value          Reference  LVOT ID, S                               20    mm       ----------  LVOT  area                                3.14  cm^2     ----------  LVOT peak velocity, S                    97.3  cm/s     ----------  LVOT mean velocity, S  75.8  cm/s     ----------  LVOT VTI, S                              27.8  cm       ----------    Aortic valve                             Value          Reference  Aortic valve peak velocity, S            425   cm/s     ----------  Aortic valve mean velocity, S            315   cm/s     ----------  Aortic valve VTI, S                      115   cm       ----------  Aortic mean gradient, S                  45    mm Hg    ----------  Aortic peak gradient, S                  72    mm Hg    ----------  VTI ratio, LVOT/AV                       0.24           ----------  Aortic valve area, VTI                   0.76  cm^2     ----------  Aortic valve area/bsa, VTI               0.38  cm^2/m^2 ----------  Velocity ratio, peak, LVOT/AV            0.23           ----------  Aortic valve area, peak velocity         0.72  cm^2     ----------  Aortic valve area/bsa, peak              0.36  cm^2/m^2 ----------  velocity  Velocity ratio, mean, LVOT/AV            0.24           ----------  Aortic valve area, mean velocity         0.76  cm^2     ----------  Aortic valve area/bsa, mean              0.37  cm^2/m^2 ----------  velocity  Aortic regurg pressure half-time         583   ms       ----------    Aorta                                    Value          Reference  Aortic root ID, ED                       30    mm       ----------  Left atrium                              Value          Reference  LA ID, A-P, ES                           40    mm       ----------  LA ID/bsa, A-P                           1.98  cm/m^2   <=2.2  LA volume, S                             56.1  ml       ----------  LA volume/bsa, S                         27.8  ml/m^2   ----------  LA volume, ES, 1-p A4C                   50.6  ml       ----------   LA volume/bsa, ES, 1-p A4C               25.1  ml/m^2   ----------  LA volume, ES, 1-p A2C                   54.6  ml       ----------  LA volume/bsa, ES, 1-p A2C               27.1  ml/m^2   ----------    Mitral valve                             Value          Reference  Mitral E-wave peak velocity              113   cm/s     ----------  Mitral A-wave peak velocity              144   cm/s     ----------  Mitral mean velocity, D                  63.2  cm/s     ----------  Mitral deceleration time                 151   ms       150 - 230  Mitral pressure half-time                99    ms       ----------  Mitral mean gradient, D                  2     mm Hg    ----------  Mitral peak gradient, D                  5     mm Hg    ----------  Mitral E/A ratio, peak                   0.8            ----------  Mitral valve area, PHT, DP               2.22  cm^2     ----------  Mitral valve area/bsa, PHT, DP           1.1   cm^2/m^2 ----------  Mitral valve area, LVOT                  2.03  cm^2     ----------  continuity  Mitral valve area/bsa, LVOT              1.01  cm^2/m^2 ----------  continuity  Mitral annulus VTI, D                    43.1  cm       ----------    Pulmonary arteries                       Value          Reference  PA pressure, S, DP                (H)    36    mm Hg    <=30    Tricuspid valve                          Value          Reference  Tricuspid regurg peak velocity           288   cm/s     ----------  Tricuspid peak RV-RA gradient            33    mm Hg    ----------  Tricuspid maximal regurg                 288   cm/s     ----------  velocity, PISA    Right atrium                             Value          Reference  RA ID, S-I, ES, A4C               (H)    56.3  mm       34 - 49  RA area, ES, A4C                  (H)    20.6  cm^2     8.3 - 19.5  RA volume, ES, A/L                       62.3  ml       ----------  RA volume/bsa, ES, A/L                   30.9   ml/m^2   ----------    Systemic veins                           Value          Reference  Estimated CVP                            3     mm Hg    ----------  Right ventricle                          Value          Reference  TAPSE                                    28    mm       ----------  RV pressure, S, DP                (H)    36    mm Hg    <=30  RV s&', lateral, S                        18.1  cm/s     ----------  Legend: (L)  and  (H)  mark values outside specified reference range.  ------------------------------------------------------------------- Prepared and Electronically Authenticated by  Candee Furbish, M.D. 2019-08-14T11:04:50    RIGHT/LEFT HEART CATH AND CORONARY ANGIOGRAPHY  Conclusion     Prox LAD to Mid LAD lesion is 30% stenosed.   1.  Calcified but patent coronary arteries with mild nonobstructive coronary artery disease. 2.  Severe aortic stenosis with a mean gradient of 43 mmHg.  Aortic valve area calculation is not accurate because of high pulmonary artery saturations leading to a very high cardiac output based on Fick method (study was done with the patient on supplemental oxygen by facemask) 3.  Elevated LVEDP 4.  Moderate pulmonary hypertension likely related to high left heart filling pressures/diastolic heart failure  Recommendations: Continued multidisciplinary evaluation for treatment of severe aortic stenosis. Pt will undergo CTA studies and formal cardiac surgical evaluation     Indications   Severe aortic stenosis [I35.0 (ICD-10-CM)]  Procedural Details/Technique   Technical Details INDICATION: Severe symptomatic aortic stenosis  PROCEDURAL DETAILS: There was an indwelling IV in a right antecubital vein. Using normal sterile technique, the IV was changed out for a 5 Fr brachial sheath over a 0.018 inch wire. The right wrist was then prepped, draped, and anesthetized with 1% lidocaine. Using the modified Seldinger technique a 5/6  French Slender sheath was placed in the right radial artery. Intra-arterial verapamil was administered through the radial artery sheath. IV heparin was administered after a JR4 catheter was advanced into the central aorta. A Swan-Ganz catheter was used for the right heart catheterization. Standard protocol was followed for recording of right heart pressures and sampling of oxygen saturations. Fick cardiac output was calculated. Standard Judkins catheters were used for selective coronary angiography. The aortic valve is crossed with a JR4 catheter. LV pressure is recorded and an aortic valve pullback is performed. There were no immediate procedural complications. The patient was transferred to the post catheterization recovery area for further monitoring.     Estimated blood loss <50 mL.  During this procedure the patient was administered the following to achieve and maintain moderate conscious sedation: Versed 2 mg, Fentanyl 25 mcg, while the patient's heart rate, blood pressure, and oxygen saturation were continuously monitored. The period of conscious sedation was 22 minutes, of which I was present face-to-face 100% of this time.  Coronary Findings   Diagnostic  Dominance: Right  Left Anterior Descending  The LAD is calcified. The vessel reaches the LV apex. There are 2 diagonal branches without stenosis. The mid LAD has 30% nonobstructive stenosis. There  are no significant stenoses throughout the LAD territory.  Prox LAD to Mid LAD lesion 30% stenosed  Prox LAD to Mid LAD lesion is 30% stenosed.  Left Circumflex  The vessel exhibits minimal luminal irregularities. The circumflex is widely patent. The vessel has minor luminal irregularities. The vessel is large and supplies a large trifurcating obtuse marginal without significant stenosis  First Obtuse Marginal Branch  The vessel exhibits minimal luminal irregularities.  Second Obtuse Marginal Branch  The vessel exhibits minimal luminal  irregularities.  Right Coronary Artery  The vessel exhibits minimal luminal irregularities. The right coronary artery is a dominant vessel. The vessel is heavily calcified but widely patent throughout with no stenosis.  Intervention   No interventions have been documented.  Coronary Diagrams   Diagnostic Diagram       Implants    No implant documentation for this case.  MERGE Images   Show images for CARDIAC CATHETERIZATION   Link to Procedure Log   Procedure Log    Hemo Data    Most Recent Value  Fick Cardiac Output 10.5 L/min  Fick Cardiac Output Index 5.67 (L/min)/BSA  Aortic Mean Gradient 43.2 mmHg  Aortic Peak Gradient 38 mmHg  Aortic Valve Area 1.73  Aortic Value Area Index 0.93 cm2/BSA  RA A Wave 17 mmHg  RA V Wave 14 mmHg  RA Mean 13 mmHg  RV Systolic Pressure 46 mmHg  RV Diastolic Pressure 9 mmHg  RV EDP 17 mmHg  PA Systolic Pressure 48 mmHg  PA Diastolic Pressure 22 mmHg  PA Mean 40 mmHg  PW A Wave 24 mmHg  PW V Wave 25 mmHg  PW Mean 20 mmHg  LV Systolic Pressure 357 mmHg  LV Diastolic Pressure 12 mmHg  LV EDP 28 mmHg  AOp Systolic Pressure 017 mmHg  AOp Diastolic Pressure 60 mmHg  AOp Mean Pressure 88 mmHg  LVp Systolic Pressure 793 mmHg  LVp Diastolic Pressure 12 mmHg  LVp EDP Pressure 34 mmHg  QP/QS 1  TPVR Index 7.05 HRUI     Cardiac TAVR CT  TECHNIQUE: The patient was scanned on a [employer] scanner. A 120 kV retrospective scan was triggered in the descending thoracic aorta at 111 HU's. Gantry rotation speed was 270 msecs and collimation was .9 mm. No beta blockade or nitro were given. The 3D data set was reconstructed in 5% intervals of the R-R cycle. Systolic and diastolic phases were analyzed on a dedicated work station using MPR, MIP and VRT modes. The patient received 80 cc of contrast.  FINDINGS: Aortic Valve: Tri leaflet, calcified with restricted leaflet motion  Aorta: Moderate calcific  atherosclerosis  Sinotubular Junction: 26 mm  Ascending Thoracic Aorta: 34 mm  Aortic Arch: 28 mm  Descending Thoracic Aorta: 22 mm  Sinus of Valsalva Measurements:  Non-coronary: 28.8 mm  Right - coronary: 29 mm  Left - coronary: 29.3 mm  Coronary Artery Height above Annulus:  Left Main: 11.1 mm above annulus  Right Coronary: 13.8 mm above annulus  Virtual Basal Annulus Measurements:  Maximum/Minimum Diameter: 21.9 mm x 26 mm  Perimeter: 76.7 mm  Area: 444 mm2  Coronary Arteries: Sufficient height above annulus for deployment  Optimum Fluoroscopic Angle for Delivery: LAO 14 Caudal 14 degrees  IMPRESSION: 1. Tri leaflet AV with annular area of 444 mm2 suitable for a 26 mm Sapien 3 valve  2. Optimum angiographic angle for deployment LAO 14 Caudal 14 degrees  3.  No LAA thrombus  4.  Normal aortic root  3.4 cm  5.  Coronary arteries sufficient height for deployment  Jenkins Rouge   Electronically Signed   By: Jenkins Rouge M.D.   On: 06/25/2018 15:55   CT CHEST, ABDOMEN AND PELVIS WITHOUT CONTRAST  TECHNIQUE: Multidetector CT imaging of the chest, abdomen and pelvis was performed following the standard protocol without IV contrast.  COMPARISON:  None.  FINDINGS: CTA CHEST FINDINGS  Cardiovascular: Heart size is normal. There is no significant pericardial fluid, thickening or pericardial calcification. There is aortic atherosclerosis, as well as atherosclerosis of the great vessels of the mediastinum and the coronary arteries, including calcified atherosclerotic plaque in the left main, left anterior descending, left circumflex and right coronary arteries. Severe thickening calcification of the aortic valve. Calcifications of the mitral annulus.  Mediastinum/Lymph Nodes: No pathologically enlarged mediastinal or hilar lymph nodes. Esophagus is unremarkable in appearance. No axillary  lymphadenopathy.  Lungs/Pleura: Scattered areas of linear scarring throughout the mid to lower lungs bilaterally. No acute consolidative airspace disease. No pleural effusions. No suspicious appearing pulmonary nodules or masses are noted.  Musculoskeletal/Soft Tissues: There are no aggressive appearing lytic or blastic lesions noted in the visualized portions of the skeleton.  CTA ABDOMEN AND PELVIS FINDINGS  Hepatobiliary: No suspicious cystic or solid hepatic lesions. No intra or extrahepatic biliary ductal dilatation. Multiple small calcified gallstones lying dependently in the gallbladder. No findings to suggest an acute cholecystitis at this time.  Pancreas: No pancreatic mass. No pancreatic ductal dilatation. No pancreatic or peripancreatic fluid or inflammatory changes.  Spleen: Unremarkable.  Adrenals/Urinary Tract: Multiple subcentimeter low-attenuation lesions in both kidneys, too small to characterize, but statistically likely to represent cysts. Exophytic 1.2 cm simple cyst in the anterior aspect of the interpolar region of the left kidney. No suspicious renal lesions. Bilateral adrenal glands are normal in appearance. No hydroureteronephrosis. Urinary bladder is normal in appearance.  Stomach/Bowel: Normal appearance of the stomach. No pathologic dilatation of small bowel or colon. A few scattered colonic diverticulae are noted, particularly in the sigmoid colon, without surrounding inflammatory changes to suggest an acute diverticulitis at this time. Normal appendix.  Vascular/Lymphatic: Aortic atherosclerosis with vascular findings and measurements pertinent to potential TAVR procedure, as detailed below. No aneurysm or dissection noted in the abdominal or pelvic vasculature. No lymphadenopathy noted in the abdomen or pelvis.  Reproductive: Status post hysterectomy. 4.6 x 2.7 x 3.7 cm low to intermediate attenuation lesion in the left adnexal  region, likely an ovarian cyst. Right ovary is unremarkable in appearance.  Other: No significant volume of ascites.  No pneumoperitoneum.  Musculoskeletal: There are no aggressive appearing lytic or blastic lesions noted in the visualized portions of the skeleton.  VASCULAR MEASUREMENTS PERTINENT TO TAVR:  AORTA:  Minimal Aortic Diameter-13 x 11 mm  Severity of Aortic Calcification-moderate  RIGHT PELVIS:  Right Common Iliac Artery -  Minimal Diameter-8.3 x 8.0 mm  Tortuosity-mild  Calcification-moderate to severe  Right External Iliac Artery -  Minimal Diameter-7.9 x 7.6 mm  Tortuosity-moderate  Calcification-none  Right Common Femoral Artery -  Minimal Diameter-7.5 x 7.8 mm  Tortuosity-mild  Calcification-none  LEFT PELVIS:  Left Common Iliac Artery -  Minimal Diameter-7.5 x 7.8 mm  Tortuosity-mild  Calcification-moderate to severe  Left External Iliac Artery -  Minimal Diameter-7.5 x 8.1 mm  Tortuosity-moderate  Calcification-none  Left Common Femoral Artery -  Minimal Diameter-7.1 x 6.7 mm  Tortuosity-mild  Calcification-none  Review of the MIP images confirms the above findings.  IMPRESSION: 1. Vascular findings and  measurements pertinent to potential TAVR procedure, as detailed above. 2. Severe thickening calcification of the aortic valve, compatible with the reported clinical history of severe aortic stenosis. 3. Aortic atherosclerosis, in addition to left main and 3 vessel coronary artery disease. 4. 4.6 x 2.7 x 3.7 cm low to intermediate attenuation lesion in the left adnexal region, presumably an ovarian cyst. Further evaluation with nonemergent pelvic ultrasound is recommended in the near future to better evaluate this finding. This recommendation follows ACR consensus guidelines: White Paper of the ACR Incidental Findings Committee II on Adnexal Findings. J Am Coll Radiol  2013:10:675-681. 5. Cholelithiasis without evidence of acute cholecystitis at this time. 6. Mild colonic diverticulosis without evidence of acute diverticulitis at this time.   Electronically Signed   By: Vinnie Langton M.D.   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  She presents with progressive symptoms of exertional shortness of breath consistent with chronic diastolic congestive heart failure, New York Heart Association functional class IIb bordering on class III.  I have personally reviewed the patient's transthoracic echocardiogram, diagnostic cardiac catheterization, and CT angiograms.  The patient's aortic valve is trileaflet with severe thickening, fibrosis, and restricted leaflet mobility involving all 3 leaflets.  There is severe aortic stenosis with peak velocity across aortic valve measured nearly 4.3 m/s corresponding to mean transvalvular gradient well above 40 mmHg.  Diagnostic cardiac catheterization confirmed the presence of severe aortic stenosis and revealed minimal coronary artery disease.  There is no question the patient needs aortic valve replacement.  Risks associated with conventional surgery would be moderately elevated and probably somewhat higher than that predicted using the STS risk calculator because of comorbid medical problems including obesity.  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  Cardiac-gated CTA of the heart reveals anatomical characteristics consistent with aortic stenosis suitable for treatment by transcatheter aortic valve replacement without any significant complicating features and CTA of the aorta and iliac vessels demonstrate what appears to be adequate pelvic vascular access to facilitate a transfemoral approach.    Plan:  The patient and her family were counseled at length regarding treatment alternatives for management of severe symptomatic aortic stenosis. Alternative approaches such as conventional aortic valve replacement, transcatheter aortic valve replacement, and continued medical therapy without intervention were compared and contrasted at length.  The risks associated with conventional surgical aortic valve replacement were discussed in detail, as were expectations for post-operative convalescence.  Issues specific to transcatheter aortic valve replacement were discussed including questions about long term valve durability, the potential for paravalvular leak, possible increased risk of need for permanent pacemaker placement, and other technical complications related to the procedure itself.  Long-term prognosis with medical therapy was discussed. This discussion was placed in the context of the patient's own specific clinical presentation and past medical history.  All of their questions have been addressed.  The patient desires to proceed with transcatheter aortic valve replacement in the near future.  We tentatively plan for surgery on July 29, 2018.  Following the decision to proceed with transcatheter aortic valve replacement, a discussion has been held regarding what types of management strategies would be attempted intraoperatively in the event of life-threatening complications, including whether or not the patient would be considered a candidate for the use of cardiopulmonary bypass and/or conversion to open sternotomy for attempted  surgical intervention.  The patient has been advised of a variety of complications that might develop including but not limited to risks of death, stroke, paravalvular leak, aortic dissection or other major vascular complications, aortic annulus rupture, device embolization, cardiac rupture or perforation, mitral regurgitation, acute myocardial infarction, arrhythmia, heart block or bradycardia requiring permanent pacemaker placement, congestive heart failure, respiratory failure, renal failure, pneumonia, infection, other late complications related to structural valve deterioration or migration, or other complications that might ultimately cause a temporary or permanent loss of functional independence or other long term morbidity.  The patient provides full informed consent for the procedure as described and all questions were answered.    I spent in excess of 90 minutes during the conduct of this office consultation and >50% of this time involved direct face-to-face encounter with the patient for counseling and/or coordination of their care.    Valentina Gu. Roxy Manns, MD 07/07/2018 9:39 AM

## 2018-07-07 NOTE — Patient Instructions (Addendum)
Stop taking metformin 2 days prior to surgery  Continue taking all other medications without change through the day before surgery.  Have nothing to eat or drink after midnight the night before surgery.  On the morning of surgery do not take any medications   

## 2018-07-08 ENCOUNTER — Encounter: Payer: Self-pay | Admitting: Physical Therapy

## 2018-07-08 ENCOUNTER — Ambulatory Visit: Payer: Medicare Other | Attending: Cardiovascular Disease | Admitting: Physical Therapy

## 2018-07-08 DIAGNOSIS — R2689 Other abnormalities of gait and mobility: Secondary | ICD-10-CM | POA: Insufficient documentation

## 2018-07-08 DIAGNOSIS — R293 Abnormal posture: Secondary | ICD-10-CM | POA: Insufficient documentation

## 2018-07-08 NOTE — Therapy (Signed)
[place], [place], [address] Phone: [phone]   Fax:  [phone]  Physical Therapy Evaluation  Patient Details  Name: BETSY ROSELLO MRN: [id number] Date of Birth: 1946/07/04 Referring Provider (PT): Sherren Mocha MD   Encounter Date: 07/08/2018  PT End of Session - 07/08/18 0845    Visit Number  1    Number of Visits  1    Date for PT Re-Evaluation  07/08/18    PT Start Time  0801    PT Stop Time  0840    PT Time Calculation (min)  39 min    Equipment Utilized During Treatment  Gait belt    Activity Tolerance  Patient tolerated treatment well    Behavior During Therapy  [facility] for tasks assessed/performed       Past Medical History:  Diagnosis Date  . Anemia   . Aortic stenosis   . Cervical myelopathy (HCC)    s/p cervical fusion @ [facility]  . Chronic diastolic congestive heart failure ([place])   . CKD (chronic kidney disease), stage III ([place])   . Decreased pedal pulses    normal ABI 02/03/13  . Diabetes mellitus ([place])   . Glaucoma   . HTN (hypertension)   . Hyperlipemia   . Hypothyroid    When pregnant  . Rheumatic fever     Past Surgical History:  Procedure Laterality Date  . APPENDECTOMY    . NECK SURGERY    . RIGHT/LEFT HEART CATH AND CORONARY ANGIOGRAPHY N/A 06/04/2018   Procedure: RIGHT/LEFT HEART CATH AND CORONARY ANGIOGRAPHY;  Surgeon: Sherren Mocha, MD;  Location: [facility];  Service: Cardiovascular;  Laterality: N/A;  . TONSILLECTOMY    . TUBAL LIGATION    . VAGINAL HYSTERECTOMY      There were no vitals filed for this visit.   Subjective Assessment - 07/08/18 0804    Subjective  pt is a 72 y.o F with CC of SOB tha thas been going on for about 3 years ago when a murmur was found but has progressively worsened since march of this year especially with walking and pushing her son. when she was younger she had a hx or a murmur, brights disease and rhuematic fever. no  hx of falls and she currently denies any pain.     How long can you sit comfortably?  1 hour    How long can you stand comfortably?  15 min     How long can you walk comfortably?  5-66mn    Patient Stated Goals  to get heart better.    Currently in Pain?  No/denies         [facility] - 07/08/18 06962     Assessment   Medical Diagnosis  severe aortic stenosis    Referring Provider (PT)  MSherren MochaMD    Onset Date/Surgical Date  --   3 years with increaesd exacerbation 12/2017   Hand Dominance  Right      Precautions   Precautions  None      Restrictions   Weight Bearing Restrictions  No      Balance Screen   Has the patient fallen in the past 6 months  No      [place]    Living Arrangements  Spouse/significant other;Other relatives   grand daughter   Available Help at Discharge  Available PRN/intermittently    Type  of [place] Access  Level entry    Home Layout  One level    [place] - 2 wheels;Cane - single point;Shower seat;Toilet riser;Grab bars - toilet;Grab bars - tub/shower      Prior Function   Vocation  Retired      Charity fundraiser Status  Within Functional Limits for tasks assessed      Posture/Postural Control   Posture/Postural Control  Postural limitations    Postural Limitations  Forward head;Flexed trunk;Rounded Shoulders      ROM / Strength   AROM / PROM / Strength  AROM;Strength      AROM   Overall AROM   Within functional limits for tasks performed    Overall AROM Comments  mild neck discomfort with reaching behind the head       Strength   Overall Strength  Within functional limits for tasks performed    Right Hand Grip (lbs)  37    Left Hand Grip (lbs)  19      Ambulation/Gait   Ambulation/Gait  Yes    Gait Pattern  Step-through pattern;Decreased stride length;Trunk flexed;Narrow base of support;Trendelenburg    Gait Comments  pt  ambulated 157f resting for 1:16 O2 94% and HR 86BPM, she ambulated an additional 185 ft requiring rest lasting 1:56 with O2 sat 93% and HR 88 BPM, she ambulated in additional 82 ft finishing the 6 min walk test       [facility] LLCP[facility]urgical Assessment - 07/08/18 0001    5 Meter Walk Test- trial 1  5 sec    5 Meter Walk Test- trial 2  5 sec.     5 Meter Walk Test- trial 3  6 sec.    5 meter walk test average  5.33 sec    4 Stage Balance Test tolerated for:   3 sec.    4 Stage Balance Test Position  4    comment  modeate posture sway in test position 3 but was able to maintain balance    Sit To Stand Test- trial 1  18 sec.    ADL/IADL Independent with:  Bathing;Dressing;Meal prep;Finances    ADL/IADL Needs Assistance with:  YValla Leaverwork    ADL/IADL Fraility Index  Midly frail    6 Minute Walk- Baseline  yes    BP (mmHg)  146/60    HR (bpm)  63    02 Sat (%RA)  95 %    Modified Borg Scale for Dyspnea  0.5- Very, very slight shortness of breath    Perceived Rate of Exertion (Borg)  6-    6 Minute Walk Post Test  yes    BP (mmHg)  160/64    HR (bpm)  88    02 Sat (%RA)  93 %    Modified Borg Scale for Dyspnea  3- Moderate shortness of breath or breathing difficulty    Perceived Rate of Exertion (Borg)  13- Somewhat hard    Aerobic Endurance Distance Walked  452    Endurance additional comments  pt demonstrated 70.74% limitation compared to age related norm              Objective measurements completed on examination: See above findings.                           Plan - 07/08/18 0846    Clinical Impression Statement  see assessment  in note    Clinical Presentation  Stable    Clinical Decision Making  Low    Rehab Potential  Good    PT Frequency  One time visit    PT Next Visit Plan  One time TAVR evaluation    Consulted and Agree with Plan of Care  Patient        Clinical Impression Statement: Pt is a 72 yo F presenting to OP PT for evaluation prior to  possible TAVR surgery due to severe aortic stenosis. Pt reports onset of SOB approximately starting 3 years ago with progressive worsening 7 months ago. Symptoms are limiting endurace. Pt presents with functional ROM and strength, fair balance and is at a mild  fall risk 4 stage balance test, good walking speed and limited aerobic endurance per 6 minute walk test. pt ambulated 12f resting for 1:16 O2 SAT 94% and HR 86BPM, she ambulated an additional 185 ft requiring seated rest lasting 1:56 with O2 sat 93% and HR 88 BPM, she ambulated in additional 82 ft finishing the 6 min walk test.  Pt reported 3/10 shortness of breath on modified scale for dyspnea. . Pt ambulated a total of 452 feet in 6 minute walk. SOB increased significantly with 6 minute walk test. Based on the Short Physical Performance Battery, patient has a frailty rating of 9/12 with </= 5/12 considered frail.    Patient demonstrated the following deficits and impairments:     Visit Diagnosis: Abnormal posture  Other abnormalities of gait and mobility     Problem List Patient Active Problem List   Diagnosis Date Noted  . Chronic diastolic congestive heart failure ([place]   . Uncontrolled type 2 diabetes mellitus with hyperglycemia, without long-term current use of insulin ([place] 09/06/2017  . Chest pain 01/24/2016  . Bruit 01/24/2016  . Aortic stenosis   . Type II or unspecified type diabetes mellitus with neurological manifestations, uncontrolled(250.62) 05/28/2014  . Kidney disease, chronic, stage III (moderate, EGFR 30-59 ml/min) (HCC) 05/28/2014  . Essential hypertension, benign 05/28/2014  . Other and unspecified hyperlipidemia 05/28/2014  . Severe obesity (BMI >= 40) (HCC) 05/28/2014   KStarr LakePT, DPT, LAT, ATC  07/08/18  8:50 AM      [facility] CR[facility] Center[address] Lake CourtGSierra Ridge NAl[place] [address]Phone: [phone]  Fax:  [phone] Name: DERUM CERCONEMRN: [id number] of Birth: 810-18-1947

## 2018-07-11 ENCOUNTER — Other Ambulatory Visit: Payer: Self-pay

## 2018-07-11 DIAGNOSIS — I350 Nonrheumatic aortic (valve) stenosis: Secondary | ICD-10-CM

## 2018-07-14 ENCOUNTER — Other Ambulatory Visit: Payer: Self-pay | Admitting: Endocrinology

## 2018-07-24 NOTE — Pre-Procedure Instructions (Signed)
Shannon Kidd  07/24/2018      [facility] - [place],  - [address] [place] [address] Phone: [phone] Fax: [phone]    Your procedure is scheduled on Tuesday, 07/29/2018.  Report to [facility] Admitting at [phone] A.M.  Call this number if you have problems the morning of surgery:  [phone]   Remember:  Do not eat or drink after midnight.    Take these medicines the morning of surgery with A SIP OF WATER: albuterol (PROVENTIL HFA;VENTOLIN HFA) inhaler - if needed (bring with you to the hospital)  7 days prior to surgery STOP taking any Aleve, Naproxen, Ibuprofen, Motrin, Advil, Goody's, BC's, all herbal medications, fish oil, and all vitamins  Follow your surgeon's instructions on when to stop Asprin.  If no instructions were given by your surgeon then you will need to call the office to get those instructions.     WHAT DO I DO ABOUT MY DIABETES MEDICATION? Marland Kitchen Per Dr. Earmon Phoenix office STOP Metformin (Glucophage) on 07/27/2018    How to Manage Your Diabetes Before and After Surgery  Why is it important to control my blood sugar before and after surgery? . Improving blood sugar levels before and after surgery helps healing and can limit problems. . A way of improving blood sugar control is eating a healthy diet by: o  Eating less sugar and carbohydrates o  Increasing activity/exercise o  Talking with your doctor about reaching your blood sugar goals . High blood sugars (greater than 180 mg/dL) can raise your risk of infections and slow your recovery, so you will need to focus on controlling your diabetes during the weeks before surgery. . Make sure that the doctor who takes care of your diabetes knows about your planned surgery including the date and location.  How do I manage my blood sugar before surgery? . Check your blood sugar at least 4 times a day, starting 2 days before surgery, to make  sure that the level is not too high or low. o Check your blood sugar the morning of your surgery when you wake up and every 2 hours until you get to the Short Stay unit. . If your blood sugar is less than 70 mg/dL, you will need to treat for low blood sugar: o Do not take insulin. o Treat a low blood sugar (less than 70 mg/dL) with  cup of clear juice (cranberry or apple), 4 glucose tablets, OR glucose gel. o Recheck blood sugar in 15 minutes after treatment (to make sure it is greater than 70 mg/dL). If your blood sugar is not greater than 70 mg/dL on recheck, call [phone] for further instructions. . Report your blood sugar to the short stay nurse when you get to Short Stay.  . If you are admitted to the hospital after surgery: o Your blood sugar will be checked by the staff and you will probably be given insulin after surgery (instead of oral diabetes medicines) to make sure you have good blood sugar levels. o The goal for blood sugar control after surgery is 80-180 mg/dL.      Do not wear jewelry, make-up or nail polish.  Do not wear lotions, powders, or perfumes, or deodorant.  Do not shave 48 hours prior to surgery.   Do not bring valuables to the hospital.  [facility] is not responsible for any belongings or valuables.  Contacts, eyeglasses, hearing aids, dentures or bridgework  may not be worn into surgery.  Leave your suitcase in the car.  After surgery it may be brought to your room.  For patients admitted to the hospital, discharge time will be determined by your treatment team.  Patients discharged the day of surgery will not be allowed to drive home.   Name and phone number of your driver:    Special instructions:   [facility]- Preparing For Surgery  Before surgery, you can play an important role. Because skin is not sterile, your skin needs to be as free of germs as possible. You can reduce the number of germs on your skin by washing with CHG (chlorahexidine  gluconate) Soap before surgery.  CHG is an antiseptic cleaner which kills germs and bonds with the skin to continue killing germs even after washing.    Oral Hygiene is also important to reduce your risk of infection.  Remember - BRUSH YOUR TEETH THE MORNING OF SURGERY WITH YOUR REGULAR TOOTHPASTE  Please do not use if you have an allergy to CHG or antibacterial soaps. If your skin becomes reddened/irritated stop using the CHG.  Do not shave (including legs and underarms) for at least 48 hours prior to first CHG shower. It is OK to shave your face.  Please follow these instructions carefully.   1. Shower the NIGHT BEFORE SURGERY and the MORNING OF SURGERY with CHG.   2. If you chose to wash your hair, wash your hair first as usual with your normal shampoo.  3. After you shampoo, rinse your hair and body thoroughly to remove the shampoo.  4. Use CHG as you would any other liquid soap. You can apply CHG directly to the skin and wash gently with a scrungie or a clean washcloth.   5. Apply the CHG Soap to your body ONLY FROM THE NECK DOWN.  Do not use on open wounds or open sores. Avoid contact with your eyes, ears, mouth and genitals (private parts). Wash Face and genitals (private parts)  with your normal soap.  6. Wash thoroughly, paying special attention to the area where your surgery will be performed.  7. Thoroughly rinse your body with warm water from the neck down.  8. DO NOT shower/wash with your normal soap after using and rinsing off the CHG Soap.  9. Pat yourself dry with a CLEAN TOWEL.  10. Wear CLEAN PAJAMAS to bed the night before surgery, wear comfortable clothes the morning of surgery  11. Place CLEAN SHEETS on your bed the night of your first shower and DO NOT SLEEP WITH PETS.    Day of Surgery: Shower as stated above. Do not apply any deodorants/lotions.  Please wear clean clothes to the hospital/surgery center.   Remember to brush your teeth WITH YOUR REGULAR  TOOTHPASTE.    Please read over the following fact sheets that you were given. Pain Booklet, Coughing and Deep Breathing, MRSA Information and Surgical Site Infection Prevention

## 2018-07-25 ENCOUNTER — Encounter (HOSPITAL_COMMUNITY)
Admission: RE | Admit: 2018-07-25 | Discharge: 2018-07-25 | Disposition: A | Discharge status: Home / Self Care | Payer: Medicare Other | Source: Ambulatory Visit | Attending: Cardiovascular Disease | Admitting: Cardiovascular Disease

## 2018-07-25 ENCOUNTER — Encounter (HOSPITAL_COMMUNITY): Payer: Self-pay

## 2018-07-25 ENCOUNTER — Ambulatory Visit (HOSPITAL_COMMUNITY)
Admission: RE | Admit: 2018-07-25 | Discharge: 2018-07-25 | Disposition: A | Discharge status: Home / Self Care | Payer: Medicare Other | Source: Ambulatory Visit | Attending: Cardiovascular Disease | Admitting: Cardiovascular Disease

## 2018-07-25 ENCOUNTER — Other Ambulatory Visit: Payer: Self-pay

## 2018-07-25 DIAGNOSIS — I350 Nonrheumatic aortic (valve) stenosis: Secondary | ICD-10-CM

## 2018-07-25 DIAGNOSIS — Z01812 Encounter for preprocedural laboratory examination: Secondary | ICD-10-CM | POA: Diagnosis not present

## 2018-07-25 DIAGNOSIS — Z01810 Encounter for preprocedural cardiovascular examination: Secondary | ICD-10-CM | POA: Insufficient documentation

## 2018-07-25 DIAGNOSIS — Z01818 Encounter for other preprocedural examination: Secondary | ICD-10-CM | POA: Diagnosis not present

## 2018-07-25 HISTORY — DX: Anxiety disorder, unspecified: F41.9

## 2018-07-25 LAB — CBC
HCT: 42.6 % (ref 36.0–46.0)
Hemoglobin: 12.1 g/dL (ref 12.0–15.0)
MCH: 26.7 pg (ref 26.0–34.0)
MCHC: 28.4 g/dL — ABNORMAL LOW (ref 30.0–36.0)
MCV: 94 fL (ref 80.0–100.0)
Platelets: 272 10*3/uL (ref 150–400)
RBC: 4.53 MIL/uL (ref 3.87–5.11)
RDW: 14.7 % (ref 11.5–15.5)
WBC: 9.1 10*3/uL (ref 4.0–10.5)
nRBC: 0 % (ref 0.0–0.2)

## 2018-07-25 LAB — COMPREHENSIVE METABOLIC PANEL WITH GFR
ALT: 16 U/L (ref 0–44)
AST: 25 U/L (ref 15–41)
Albumin: 3.7 g/dL (ref 3.5–5.0)
Alkaline Phosphatase: 77 U/L (ref 38–126)
Anion gap: 8 (ref 5–15)
BUN: 24 mg/dL — ABNORMAL HIGH (ref 8–23)
CO2: 22 mmol/L (ref 22–32)
Calcium: 9.5 mg/dL (ref 8.9–10.3)
Chloride: 107 mmol/L (ref 98–111)
Creatinine, Ser: 1.13 mg/dL — ABNORMAL HIGH (ref 0.44–1.00)
GFR calc Af Amer: 55 mL/min — ABNORMAL LOW
GFR calc non Af Amer: 47 mL/min — ABNORMAL LOW
Glucose, Bld: 153 mg/dL — ABNORMAL HIGH (ref 70–99)
Potassium: 4.4 mmol/L (ref 3.5–5.1)
Sodium: 137 mmol/L (ref 135–145)
Total Bilirubin: 0.6 mg/dL (ref 0.3–1.2)
Total Protein: 7.5 g/dL (ref 6.5–8.1)

## 2018-07-25 LAB — BLOOD GAS, ARTERIAL
Acid-base deficit: 0.4 mmol/L (ref 0.0–2.0)
Bicarbonate: 24.3 mmol/L (ref 20.0–28.0)
Drawn by: 449841
FIO2: 21
O2 Saturation: 96.4 %
Patient temperature: 98.6
pCO2 arterial: 43.4 mmHg (ref 32.0–48.0)
pH, Arterial: 7.366 (ref 7.350–7.450)
pO2, Arterial: 87.6 mmHg (ref 83.0–108.0)

## 2018-07-25 LAB — SURGICAL PCR SCREEN
MRSA, PCR: NEGATIVE
Staphylococcus aureus: POSITIVE — AB

## 2018-07-25 LAB — URINALYSIS, ROUTINE W REFLEX MICROSCOPIC
Bilirubin Urine: NEGATIVE
Glucose, UA: NEGATIVE mg/dL
Ketones, ur: NEGATIVE mg/dL
Leukocytes, UA: NEGATIVE
Nitrite: NEGATIVE
Protein, ur: NEGATIVE mg/dL
Specific Gravity, Urine: 1.005 (ref 1.005–1.030)
pH: 5 (ref 5.0–8.0)

## 2018-07-25 LAB — GLUCOSE, CAPILLARY: Glucose-Capillary: 145 mg/dL — ABNORMAL HIGH (ref 70–99)

## 2018-07-25 LAB — PROTIME-INR
INR: 1.08
Prothrombin Time: 13.9 s (ref 11.4–15.2)

## 2018-07-25 LAB — HEMOGLOBIN A1C
Hgb A1c MFr Bld: 7.3 % — ABNORMAL HIGH (ref 4.8–5.6)
Mean Plasma Glucose: 162.81 mg/dL

## 2018-07-25 LAB — ABO/RH: ABO/RH(D): O NEG

## 2018-07-25 LAB — BRAIN NATRIURETIC PEPTIDE: B Natriuretic Peptide: 69.6 pg/mL (ref 0.0–100.0)

## 2018-07-25 LAB — TYPE AND SCREEN
ABO/RH(D): O NEG
Antibody Screen: NEGATIVE

## 2018-07-25 LAB — APTT: aPTT: 31 s (ref 24–36)

## 2018-07-25 NOTE — Progress Notes (Signed)
PCP - Dr. Juluis Rainier Cardiologist - Dr. Jens Som Endocrinologist - Dr. Lucianne Muss  Chest x-ray - 07/25/2018 EKG - 07/25/2018 Stress Test - 02/09/2016 ECHO - 05/21/2018 Cardiac Cath - 06/04/2018  Sleep Study - patient denies  Fasting Blood Sugar - 130's Checks Blood Sugar 1-2 times a day  Aspirin Instructions: instructed to follow dr. Earmon Phoenix instructions  Anesthesia review: yes, cardiac history  Patient denies shortness of breath, fever, cough and chest pain at PAT appointment   Patient verbalized understanding of instructions that were given to them at the PAT appointment. Patient was also instructed that they will need to review over the PAT instructions again at home before surgery.

## 2018-07-25 NOTE — Progress Notes (Signed)
Left message for Julieta Gutting, RN, notifying of abnormal UA.

## 2018-07-28 MED ORDER — NITROGLYCERIN IN D5W 200-5 MCG/ML-% IV SOLN
2.0000 ug/min | INTRAVENOUS | Status: DC
  Filled 2018-07-28: qty 250

## 2018-07-28 MED ORDER — SODIUM CHLORIDE 0.9 % IV SOLN
1.5000 g | INTRAVENOUS | Status: AC
  Administered 2018-07-29: 1.5 g via INTRAVENOUS
  Filled 2018-07-28: qty 1.5

## 2018-07-28 MED ORDER — PHENYLEPHRINE HCL-NACL 20-0.9 MG/250ML-% IV SOLN
30.0000 ug/min | INTRAVENOUS | Status: DC
  Filled 2018-07-28: qty 250

## 2018-07-28 MED ORDER — PLASMA-LYTE 148 IV SOLN
INTRAVENOUS | Status: DC
  Filled 2018-07-28: qty 2.5

## 2018-07-28 MED ORDER — SODIUM CHLORIDE 0.9 % IV SOLN
INTRAVENOUS | Status: DC
  Filled 2018-07-28: qty 30

## 2018-07-28 MED ORDER — VANCOMYCIN HCL 10 G IV SOLR
1500.0000 mg | INTRAVENOUS | Status: AC
  Administered 2018-07-29: 1500 mg via INTRAVENOUS
  Filled 2018-07-28: qty 1500

## 2018-07-28 MED ORDER — MILRINONE LACTATE IN DEXTROSE 20-5 MG/100ML-% IV SOLN
0.3000 ug/kg/min | INTRAVENOUS | Status: DC
  Filled 2018-07-28: qty 100

## 2018-07-28 MED ORDER — DOPAMINE-DEXTROSE 3.2-5 MG/ML-% IV SOLN
0.0000 ug/kg/min | INTRAVENOUS | Status: DC
  Filled 2018-07-28: qty 250

## 2018-07-28 MED ORDER — INSULIN REGULAR(HUMAN) IN NACL 100-0.9 UT/100ML-% IV SOLN
INTRAVENOUS | Status: DC
  Filled 2018-07-28: qty 100

## 2018-07-28 MED ORDER — TRANEXAMIC ACID (OHS) BOLUS VIA INFUSION
15.0000 mg/kg | INTRAVENOUS | Status: DC
  Filled 2018-07-28: qty 1359

## 2018-07-28 MED ORDER — SODIUM CHLORIDE 0.9 % IV SOLN
750.0000 mg | INTRAVENOUS | Status: DC
  Filled 2018-07-28: qty 750

## 2018-07-28 MED ORDER — EPINEPHRINE PF 1 MG/ML IJ SOLN
0.0000 ug/min | INTRAVENOUS | Status: DC
  Filled 2018-07-28: qty 4

## 2018-07-28 MED ORDER — TRANEXAMIC ACID (OHS) PUMP PRIME SOLUTION
2.0000 mg/kg | INTRAVENOUS | Status: DC
  Filled 2018-07-28: qty 1.81

## 2018-07-28 MED ORDER — DEXMEDETOMIDINE HCL IN NACL 400 MCG/100ML IV SOLN
0.1000 ug/kg/h | INTRAVENOUS | Status: AC
  Administered 2018-07-29: 1 ug/kg/h via INTRAVENOUS
  Filled 2018-07-28: qty 100

## 2018-07-28 MED ORDER — TRANEXAMIC ACID 1000 MG/10ML IV SOLN
1.5000 mg/kg/h | INTRAVENOUS | Status: DC
  Filled 2018-07-28: qty 25

## 2018-07-28 MED ORDER — POTASSIUM CHLORIDE 2 MEQ/ML IV SOLN
80.0000 meq | INTRAVENOUS | Status: DC
  Filled 2018-07-28: qty 40

## 2018-07-28 MED ORDER — MAGNESIUM SULFATE 50 % IJ SOLN
40.0000 meq | INTRAMUSCULAR | Status: DC
  Filled 2018-07-28: qty 9.85

## 2018-07-29 ENCOUNTER — Inpatient Hospital Stay (HOSPITAL_COMMUNITY): Payer: Medicare Other

## 2018-07-29 ENCOUNTER — Inpatient Hospital Stay (HOSPITAL_COMMUNITY): Payer: Medicare Other | Admitting: Physician Assistant

## 2018-07-29 ENCOUNTER — Other Ambulatory Visit (HOSPITAL_COMMUNITY): Payer: Self-pay | Admitting: Cardiovascular Disease

## 2018-07-29 ENCOUNTER — Inpatient Hospital Stay (HOSPITAL_COMMUNITY): Payer: Medicare Other | Admitting: Certified Registered Nurse Anesthetist

## 2018-07-29 ENCOUNTER — Encounter (HOSPITAL_COMMUNITY)
Admission: RE | Disposition: A | Discharge status: Home / Self Care | Payer: Self-pay | Source: Ambulatory Visit | Attending: Cardiovascular Disease

## 2018-07-29 ENCOUNTER — Encounter (HOSPITAL_COMMUNITY): Payer: Self-pay | Admitting: Certified Registered Nurse Anesthetist

## 2018-07-29 ENCOUNTER — Other Ambulatory Visit: Payer: Self-pay

## 2018-07-29 ENCOUNTER — Inpatient Hospital Stay (HOSPITAL_COMMUNITY)
Admission: RE | Admit: 2018-07-29 | Discharge: 2018-07-31 | DRG: 267 | Disposition: A | Discharge status: Home / Self Care | Payer: Medicare Other | Source: Ambulatory Visit | Attending: Cardiovascular Disease | Admitting: Cardiovascular Disease

## 2018-07-29 DIAGNOSIS — I130 Hypertensive heart and chronic kidney disease with heart failure and stage 1 through stage 4 chronic kidney disease, or unspecified chronic kidney disease: Secondary | ICD-10-CM | POA: Diagnosis present

## 2018-07-29 DIAGNOSIS — Z7982 Long term (current) use of aspirin: Secondary | ICD-10-CM

## 2018-07-29 DIAGNOSIS — I350 Nonrheumatic aortic (valve) stenosis: Secondary | ICD-10-CM | POA: Diagnosis present

## 2018-07-29 DIAGNOSIS — Z8639 Personal history of other endocrine, nutritional and metabolic disease: Secondary | ICD-10-CM | POA: Diagnosis not present

## 2018-07-29 DIAGNOSIS — Z8619 Personal history of other infectious and parasitic diseases: Secondary | ICD-10-CM | POA: Diagnosis not present

## 2018-07-29 DIAGNOSIS — N183 Chronic kidney disease, stage 3 unspecified: Secondary | ICD-10-CM | POA: Diagnosis present

## 2018-07-29 DIAGNOSIS — R001 Bradycardia, unspecified: Secondary | ICD-10-CM | POA: Diagnosis not present

## 2018-07-29 DIAGNOSIS — I5032 Chronic diastolic (congestive) heart failure: Secondary | ICD-10-CM | POA: Diagnosis present

## 2018-07-29 DIAGNOSIS — E1122 Type 2 diabetes mellitus with diabetic chronic kidney disease: Secondary | ICD-10-CM | POA: Diagnosis present

## 2018-07-29 DIAGNOSIS — I5030 Unspecified diastolic (congestive) heart failure: Secondary | ICD-10-CM | POA: Diagnosis not present

## 2018-07-29 DIAGNOSIS — Z87448 Personal history of other diseases of urinary system: Secondary | ICD-10-CM | POA: Diagnosis not present

## 2018-07-29 DIAGNOSIS — I2510 Atherosclerotic heart disease of native coronary artery without angina pectoris: Secondary | ICD-10-CM | POA: Diagnosis present

## 2018-07-29 DIAGNOSIS — Z79899 Other long term (current) drug therapy: Secondary | ICD-10-CM

## 2018-07-29 DIAGNOSIS — I9581 Postprocedural hypotension: Secondary | ICD-10-CM | POA: Diagnosis not present

## 2018-07-29 DIAGNOSIS — Z7984 Long term (current) use of oral hypoglycemic drugs: Secondary | ICD-10-CM | POA: Diagnosis not present

## 2018-07-29 DIAGNOSIS — Z006 Encounter for examination for normal comparison and control in clinical research program: Secondary | ICD-10-CM

## 2018-07-29 DIAGNOSIS — Z981 Arthrodesis status: Secondary | ICD-10-CM

## 2018-07-29 DIAGNOSIS — Z888 Allergy status to other drugs, medicaments and biological substances status: Secondary | ICD-10-CM

## 2018-07-29 DIAGNOSIS — E785 Hyperlipidemia, unspecified: Secondary | ICD-10-CM | POA: Diagnosis present

## 2018-07-29 DIAGNOSIS — Z952 Presence of prosthetic heart valve: Secondary | ICD-10-CM | POA: Insufficient documentation

## 2018-07-29 DIAGNOSIS — H409 Unspecified glaucoma: Secondary | ICD-10-CM | POA: Diagnosis present

## 2018-07-29 DIAGNOSIS — F419 Anxiety disorder, unspecified: Secondary | ICD-10-CM | POA: Diagnosis present

## 2018-07-29 DIAGNOSIS — I10 Essential (primary) hypertension: Secondary | ICD-10-CM | POA: Diagnosis present

## 2018-07-29 DIAGNOSIS — I082 Rheumatic disorders of both aortic and tricuspid valves: Principal | ICD-10-CM | POA: Diagnosis present

## 2018-07-29 DIAGNOSIS — Z6839 Body mass index (BMI) 39.0-39.9, adult: Secondary | ICD-10-CM | POA: Diagnosis not present

## 2018-07-29 DIAGNOSIS — I272 Pulmonary hypertension, unspecified: Secondary | ICD-10-CM | POA: Diagnosis present

## 2018-07-29 DIAGNOSIS — E119 Type 2 diabetes mellitus without complications: Secondary | ICD-10-CM

## 2018-07-29 HISTORY — DX: Nonrheumatic aortic (valve) stenosis: I35.0

## 2018-07-29 HISTORY — DX: Morbid (severe) obesity due to excess calories: E66.01

## 2018-07-29 HISTORY — PX: TRANSCATHETER AORTIC VALVE REPLACEMENT, TRANSFEMORAL: SHX6400

## 2018-07-29 HISTORY — DX: Presence of prosthetic heart valve: Z95.2

## 2018-07-29 HISTORY — PX: INTRAOPERATIVE TRANSTHORACIC ECHOCARDIOGRAM: SHX6523

## 2018-07-29 LAB — POCT I-STAT, CHEM 8
BUN: 23 mg/dL (ref 8–23)
BUN: 22 mg/dL (ref 8–23)
BUN: 22 mg/dL (ref 8–23)
Calcium, Ion: 1.27 mmol/L (ref 1.15–1.40)
Calcium, Ion: 1.21 mmol/L (ref 1.15–1.40)
Calcium, Ion: 1.22 mmol/L (ref 1.15–1.40)
Chloride: 103 mmol/L (ref 98–111)
Chloride: 104 mmol/L (ref 98–111)
Chloride: 104 mmol/L (ref 98–111)
Creatinine, Ser: 1.1 mg/dL — ABNORMAL HIGH (ref 0.44–1.00)
Creatinine, Ser: 1.1 mg/dL — ABNORMAL HIGH (ref 0.44–1.00)
Creatinine, Ser: 1.1 mg/dL — ABNORMAL HIGH (ref 0.44–1.00)
Glucose, Bld: 169 mg/dL — ABNORMAL HIGH (ref 70–99)
Glucose, Bld: 230 mg/dL — ABNORMAL HIGH (ref 70–99)
Glucose, Bld: 212 mg/dL — ABNORMAL HIGH (ref 70–99)
HCT: 34 % — ABNORMAL LOW (ref 36.0–46.0)
HCT: 31 % — ABNORMAL LOW (ref 36.0–46.0)
HCT: 31 % — ABNORMAL LOW (ref 36.0–46.0)
Hemoglobin: 11.6 g/dL — ABNORMAL LOW (ref 12.0–15.0)
Hemoglobin: 10.5 g/dL — ABNORMAL LOW (ref 12.0–15.0)
Hemoglobin: 10.5 g/dL — ABNORMAL LOW (ref 12.0–15.0)
Potassium: 4.4 mmol/L (ref 3.5–5.1)
Potassium: 4.6 mmol/L (ref 3.5–5.1)
Potassium: 4.2 mmol/L (ref 3.5–5.1)
Sodium: 140 mmol/L (ref 135–145)
Sodium: 140 mmol/L (ref 135–145)
Sodium: 140 mmol/L (ref 135–145)
TCO2: 29 mmol/L (ref 22–32)
TCO2: 26 mmol/L (ref 22–32)
TCO2: 25 mmol/L (ref 22–32)

## 2018-07-29 LAB — GLUCOSE, CAPILLARY
Glucose-Capillary: 145 mg/dL — ABNORMAL HIGH (ref 70–99)
Glucose-Capillary: 143 mg/dL — ABNORMAL HIGH (ref 70–99)
Glucose-Capillary: 92 mg/dL (ref 70–99)
Glucose-Capillary: 110 mg/dL — ABNORMAL HIGH (ref 70–99)
Glucose-Capillary: 142 mg/dL — ABNORMAL HIGH (ref 70–99)

## 2018-07-29 LAB — POCT I-STAT 4, (NA,K, GLUC, HGB,HCT)
Glucose, Bld: 117 mg/dL — ABNORMAL HIGH (ref 70–99)
HCT: 32 % — ABNORMAL LOW (ref 36.0–46.0)
Hemoglobin: 10.9 g/dL — ABNORMAL LOW (ref 12.0–15.0)
Potassium: 4.4 mmol/L (ref 3.5–5.1)
Sodium: 142 mmol/L (ref 135–145)

## 2018-07-29 SURGERY — TRANSCATHETER AORTIC VALVE REPLACEMENT, TRANSFEMORAL
Anesthesia: Monitor Anesthesia Care | Site: Chest

## 2018-07-29 MED ORDER — ONDANSETRON HCL 4 MG/2ML IJ SOLN
INTRAMUSCULAR | Status: AC
  Filled 2018-07-29: qty 2

## 2018-07-29 MED ORDER — DOPAMINE-DEXTROSE 3.2-5 MG/ML-% IV SOLN
0.0000 ug/kg/min | Freq: Once | INTRAVENOUS | Status: AC
  Administered 2018-07-29: 3 ug/kg/min via INTRAVENOUS

## 2018-07-29 MED ORDER — OXYCODONE HCL 5 MG PO TABS: 5.0000 mg | ORAL_TABLET | ORAL | Status: DC | PRN

## 2018-07-29 MED ORDER — SODIUM CHLORIDE 0.9% FLUSH: 3.0000 mL | INTRAVENOUS | Status: DC | PRN

## 2018-07-29 MED ORDER — PHENYLEPHRINE 40 MCG/ML (10ML) SYRINGE FOR IV PUSH (FOR BLOOD PRESSURE SUPPORT)
PREFILLED_SYRINGE | INTRAVENOUS | Status: AC
  Filled 2018-07-29: qty 10

## 2018-07-29 MED ORDER — FENTANYL CITRATE (PF) 250 MCG/5ML IJ SOLN
INTRAMUSCULAR | Status: AC
  Filled 2018-07-29: qty 5

## 2018-07-29 MED ORDER — ONDANSETRON HCL 4 MG/2ML IJ SOLN
INTRAMUSCULAR | Status: DC | PRN
  Administered 2018-07-29: 4 mg via INTRAVENOUS

## 2018-07-29 MED ORDER — DOPAMINE-DEXTROSE 3.2-5 MG/ML-% IV SOLN
INTRAVENOUS | Status: AC
  Filled 2018-07-29: qty 250

## 2018-07-29 MED ORDER — MIDAZOLAM HCL 2 MG/2ML IJ SOLN
INTRAMUSCULAR | Status: AC
  Administered 2018-07-29: 2 mg via INTRAVENOUS
  Filled 2018-07-29: qty 2

## 2018-07-29 MED ORDER — LIDOCAINE HCL (PF) 1 % IJ SOLN
INTRAMUSCULAR | Status: DC | PRN
  Administered 2018-07-29: 8 mL

## 2018-07-29 MED ORDER — CHLORHEXIDINE GLUCONATE 4 % EX LIQD: 30.0000 mL | CUTANEOUS | Status: DC

## 2018-07-29 MED ORDER — PHENYLEPHRINE HCL-NACL 20-0.9 MG/250ML-% IV SOLN
0.0000 ug/min | INTRAVENOUS | Status: DC
  Filled 2018-07-29: qty 250

## 2018-07-29 MED ORDER — CHLORHEXIDINE GLUCONATE 0.12 % MT SOLN
15.0000 mL | Freq: Once | OROMUCOSAL | Status: AC
  Administered 2018-07-29: 15 mL via OROMUCOSAL
  Filled 2018-07-29: qty 15

## 2018-07-29 MED ORDER — DEXMEDETOMIDINE HCL 200 MCG/2ML IV SOLN
INTRAVENOUS | Status: DC | PRN
  Administered 2018-07-29: 90.6 ug via INTRAVENOUS

## 2018-07-29 MED ORDER — ROSUVASTATIN CALCIUM 20 MG PO TABS
20.0000 mg | ORAL_TABLET | Freq: Every day | ORAL | Status: DC
  Administered 2018-07-29 – 2018-07-31 (×3): 20 mg via ORAL
  Filled 2018-07-29 (×3): qty 1

## 2018-07-29 MED ORDER — SODIUM CHLORIDE 0.9% FLUSH
3.0000 mL | Freq: Two times a day (BID) | INTRAVENOUS | Status: DC
  Administered 2018-07-29 – 2018-07-31 (×3): 3 mL via INTRAVENOUS

## 2018-07-29 MED ORDER — MORPHINE SULFATE (PF) 2 MG/ML IV SOLN: 2.0000 mg | INTRAVENOUS | Status: DC | PRN

## 2018-07-29 MED ORDER — INSULIN ASPART 100 UNIT/ML ~~LOC~~ SOLN
0.0000 [IU] | Freq: Three times a day (TID) | SUBCUTANEOUS | Status: DC
  Administered 2018-07-29 – 2018-07-30 (×2): 2 [IU] via SUBCUTANEOUS
  Administered 2018-07-30 (×2): 4 [IU] via SUBCUTANEOUS
  Administered 2018-07-30 – 2018-07-31 (×3): 2 [IU] via SUBCUTANEOUS

## 2018-07-29 MED ORDER — ACETAMINOPHEN 325 MG PO TABS: 650.0000 mg | ORAL_TABLET | Freq: Four times a day (QID) | ORAL | Status: DC | PRN

## 2018-07-29 MED ORDER — SODIUM CHLORIDE 0.9 % IV SOLN
1.5000 g | Freq: Two times a day (BID) | INTRAVENOUS | Status: AC
  Administered 2018-07-29 – 2018-07-31 (×4): 1.5 g via INTRAVENOUS
  Filled 2018-07-29 (×5): qty 1.5

## 2018-07-29 MED ORDER — VANCOMYCIN HCL IN DEXTROSE 1-5 GM/200ML-% IV SOLN
1000.0000 mg | Freq: Once | INTRAVENOUS | Status: AC
  Administered 2018-07-29: 1000 mg via INTRAVENOUS
  Filled 2018-07-29: qty 200

## 2018-07-29 MED ORDER — NITROGLYCERIN IN D5W 200-5 MCG/ML-% IV SOLN: 0.0000 ug/min | INTRAVENOUS | Status: DC

## 2018-07-29 MED ORDER — METOPROLOL TARTRATE 5 MG/5ML IV SOLN: 2.5000 mg | INTRAVENOUS | Status: DC | PRN

## 2018-07-29 MED ORDER — SODIUM CHLORIDE 0.9 % IV SOLN
INTRAVENOUS | Status: AC
  Filled 2018-07-29 (×3): qty 1.2

## 2018-07-29 MED ORDER — PRAMIPEXOLE DIHYDROCHLORIDE 0.25 MG PO TABS
0.2500 mg | ORAL_TABLET | Freq: Every day | ORAL | Status: DC
  Administered 2018-07-29 – 2018-07-30 (×2): 0.25 mg via ORAL
  Filled 2018-07-29 (×3): qty 1

## 2018-07-29 MED ORDER — IODIXANOL 320 MG/ML IV SOLN
INTRAVENOUS | Status: DC | PRN
  Administered 2018-07-29: 55.1 mL via INTRAVENOUS

## 2018-07-29 MED ORDER — TRAMADOL HCL 50 MG PO TABS
50.0000 mg | ORAL_TABLET | ORAL | Status: DC | PRN
  Administered 2018-07-30: 50 mg via ORAL
  Filled 2018-07-29: qty 1

## 2018-07-29 MED ORDER — INSULIN ASPART 100 UNIT/ML ~~LOC~~ SOLN
SUBCUTANEOUS | Status: DC | PRN
  Administered 2018-07-29: 8 [IU] via SUBCUTANEOUS

## 2018-07-29 MED ORDER — SODIUM CHLORIDE 0.9 % IV SOLN
INTRAVENOUS | Status: DC | PRN
  Administered 2018-07-29: 1500 mL

## 2018-07-29 MED ORDER — ONDANSETRON HCL 4 MG/2ML IJ SOLN: 4.0000 mg | Freq: Four times a day (QID) | INTRAMUSCULAR | Status: DC | PRN

## 2018-07-29 MED ORDER — MIDAZOLAM HCL 2 MG/2ML IJ SOLN
2.0000 mg | Freq: Once | INTRAMUSCULAR | Status: AC
  Administered 2018-07-29: 2 mg via INTRAVENOUS

## 2018-07-29 MED ORDER — ACETAMINOPHEN 650 MG RE SUPP: 650.0000 mg | Freq: Four times a day (QID) | RECTAL | Status: DC | PRN

## 2018-07-29 MED ORDER — CHLORHEXIDINE GLUCONATE 4 % EX LIQD: 60.0000 mL | Freq: Once | CUTANEOUS | Status: DC

## 2018-07-29 MED ORDER — LACTATED RINGERS IV SOLN
INTRAVENOUS | Status: DC
  Administered 2018-07-29: 09:00:00 via INTRAVENOUS

## 2018-07-29 MED ORDER — SODIUM CHLORIDE 0.9 % IV SOLN: INTRAVENOUS | Status: DC

## 2018-07-29 MED ORDER — CLOPIDOGREL BISULFATE 75 MG PO TABS
75.0000 mg | ORAL_TABLET | Freq: Every day | ORAL | Status: DC
  Administered 2018-07-30 – 2018-07-31 (×2): 75 mg via ORAL
  Filled 2018-07-29 (×2): qty 1

## 2018-07-29 MED ORDER — PROTAMINE SULFATE 10 MG/ML IV SOLN
INTRAVENOUS | Status: DC | PRN
  Administered 2018-07-29: 140 mg via INTRAVENOUS

## 2018-07-29 MED ORDER — SODIUM CHLORIDE 0.9 % IV SOLN
INTRAVENOUS | Status: AC
  Administered 2018-07-29: 19:00:00 via INTRAVENOUS
  Administered 2018-07-29: 50 mL/h via INTRAVENOUS

## 2018-07-29 MED ORDER — PROPOFOL 500 MG/50ML IV EMUL
INTRAVENOUS | Status: DC | PRN
  Administered 2018-07-29: 25 ug/kg/min via INTRAVENOUS

## 2018-07-29 MED ORDER — FENTANYL CITRATE (PF) 100 MCG/2ML IJ SOLN
50.0000 ug | Freq: Once | INTRAMUSCULAR | Status: AC
  Administered 2018-07-29: 50 ug via INTRAVENOUS

## 2018-07-29 MED ORDER — HEPARIN SODIUM (PORCINE) 1000 UNIT/ML IJ SOLN
INTRAMUSCULAR | Status: DC | PRN
  Administered 2018-07-29: 14000 [IU] via INTRAVENOUS

## 2018-07-29 MED ORDER — PROPOFOL 10 MG/ML IV BOLUS
INTRAVENOUS | Status: AC
  Filled 2018-07-29: qty 20

## 2018-07-29 MED ORDER — LIDOCAINE HCL 1 % IJ SOLN
INTRAMUSCULAR | Status: AC
  Filled 2018-07-29: qty 20

## 2018-07-29 MED ORDER — SODIUM CHLORIDE 0.9 % IV SOLN
250.0000 mL | INTRAVENOUS | Status: DC | PRN
  Administered 2018-07-29 – 2018-07-31 (×2): 250 mL via INTRAVENOUS

## 2018-07-29 MED ORDER — ASPIRIN 81 MG PO CHEW
81.0000 mg | CHEWABLE_TABLET | Freq: Every day | ORAL | Status: DC
  Administered 2018-07-30 – 2018-07-31 (×2): 81 mg via ORAL
  Filled 2018-07-29 (×2): qty 1

## 2018-07-29 MED ORDER — FENTANYL CITRATE (PF) 100 MCG/2ML IJ SOLN
INTRAMUSCULAR | Status: AC
  Administered 2018-07-29: 50 ug via INTRAVENOUS
  Filled 2018-07-29: qty 2

## 2018-07-29 MED ORDER — SERTRALINE HCL 50 MG PO TABS
25.0000 mg | ORAL_TABLET | Freq: Every day | ORAL | Status: DC
  Administered 2018-07-29 – 2018-07-30 (×2): 25 mg via ORAL
  Filled 2018-07-29 (×2): qty 1

## 2018-07-29 MED ORDER — SODIUM CHLORIDE 0.9 % IV BOLUS: 500.0000 mL | Freq: Once | INTRAVENOUS | Status: DC

## 2018-07-29 MED ORDER — PROPOFOL 10 MG/ML IV BOLUS
INTRAVENOUS | Status: DC | PRN
  Administered 2018-07-29 (×2): 10 mg via INTRAVENOUS

## 2018-07-29 MED ORDER — INSULIN ASPART 100 UNIT/ML ~~LOC~~ SOLN
8.0000 [IU] | Freq: Once | SUBCUTANEOUS | Status: DC
  Filled 2018-07-29: qty 0.08

## 2018-07-29 SURGICAL SUPPLY — 91 items
ADH SKN CLS APL DERMABOND .7 (GAUZE/BANDAGES/DRESSINGS) ×3
ADH SKN CLS LQ APL DERMABOND (GAUZE/BANDAGES/DRESSINGS) ×3
BAG DECANTER FOR FLEXI CONT (MISCELLANEOUS)
BAG SNAP BAND KOVER 36X36 (MISCELLANEOUS) ×4
BLADE CLIPPER SURG (BLADE)
BLADE STERNUM SYSTEM 6 (BLADE)
BLADE SURG 11 STRL SS (BLADE) ×4
CABLE ADAPT CONN TEMP 6FT (ADAPTER) ×4
CANISTER SUCT 3000ML PPV (MISCELLANEOUS)
CANNULA FEM VENOUS REMOTE 22FR (CANNULA)
CANNULA OPTISITE PERFUSION 16F (CANNULA)
CANNULA OPTISITE PERFUSION 18F (CANNULA)
CATH DIAG EXPO 6F VENT PIG 145 (CATHETERS) ×8
CATH EXPO 5FR AL1 (CATHETERS)
CATH INFINITI 6F AL2 (CATHETERS)
CATH S G BIP PACING (SET/KITS/TRAYS/PACK) ×4
CLIP VESOCCLUDE MED 24/CT (CLIP) ×4
CLIP VESOCCLUDE SM WIDE 24/CT (CLIP) ×4
CONT SPEC 4OZ CLIKSEAL STRL BL (MISCELLANEOUS) ×8
COVER BACK TABLE 80X110 HD (DRAPES)
COVER DOME SNAP 22 D (MISCELLANEOUS)
COVER WAND RF STERILE (DRAPES) ×4
CRADLE DONUT ADULT HEAD (MISCELLANEOUS) ×4
DERMABOND ADHESIVE PROPEN (GAUZE/BANDAGES/DRESSINGS) ×1
DERMABOND ADVANCED (GAUZE/BANDAGES/DRESSINGS) ×1
DERMABOND ADVANCED .7 DNX12 (GAUZE/BANDAGES/DRESSINGS) ×3
DERMABOND ADVANCED .7 DNX6 (GAUZE/BANDAGES/DRESSINGS) ×3
DRAPE INCISE IOBAN 66X45 STRL (DRAPES)
DRSG TEGADERM 4X4.75 (GAUZE/BANDAGES/DRESSINGS) ×4
ELECT CAUTERY BLADE 6.4 (BLADE)
ELECT REM PT RETURN 9FT ADLT (ELECTROSURGICAL) ×8
FELT TEFLON 6X6 (MISCELLANEOUS) ×4
FEMORAL VENOUS CANN RAP (CANNULA)
GAUZE SPONGE 4X4 12PLY STRL (GAUZE/BANDAGES/DRESSINGS) ×4
GLOVE BIO SURGEON STRL SZ7.5 (GLOVE)
GLOVE BIO SURGEON STRL SZ8 (GLOVE)
GLOVE EUDERMIC 7 POWDERFREE (GLOVE)
GLOVE ORTHO TXT STRL SZ7.5 (GLOVE)
GOWN STRL REUS W/ TWL LRG LVL3 (GOWN DISPOSABLE)
GOWN STRL REUS W/ TWL XL LVL3 (GOWN DISPOSABLE) ×3
GOWN STRL REUS W/TWL LRG LVL3 (GOWN DISPOSABLE)
GOWN STRL REUS W/TWL XL LVL3 (GOWN DISPOSABLE) ×4
GUIDEWIRE SAF TJ AMPL .035X180 (WIRE) ×4
GUIDEWIRE SAFE TJ AMPLATZ EXST (WIRE) ×4
GUIDEWIRE STRAIGHT .035 260CM (WIRE) ×4
INSERT FOGARTY SM (MISCELLANEOUS)
KIT BASIN OR (CUSTOM PROCEDURE TRAY) ×4
KIT DILATOR VASC 18G NDL (KITS)
KIT HEART LEFT (KITS) ×4
KIT SUCTION CATH 14FR (SUCTIONS) ×8
KIT TURNOVER KIT B (KITS) ×4
LOOP VESSEL MAXI BLUE (MISCELLANEOUS)
LOOP VESSEL MINI RED (MISCELLANEOUS)
NDL PERC 18GX7CM (NEEDLE) ×2 IMPLANT
NEEDLE PERC 18GX7CM (NEEDLE) ×4
NS IRRIG 1000ML POUR BTL (IV SOLUTION) ×4
PACK ENDOVASCULAR (PACKS) ×4
PAD ARMBOARD 7.5X6 YLW CONV (MISCELLANEOUS) ×8
PAD ELECT DEFIB RADIOL ZOLL (MISCELLANEOUS) ×4
PENCIL BUTTON HOLSTER BLD 10FT (ELECTRODE)
PERCLOSE PROGLIDE 6F (VASCULAR PRODUCTS) ×8
SET MICROPUNCTURE 5F STIFF (MISCELLANEOUS) ×4
SHEATH BRITE TIP 6FR 35CM (SHEATH) ×4
SHEATH PINNACLE 6F 10CM (SHEATH)
SHEATH PINNACLE 8F 10CM (SHEATH) ×4
SLEEVE REPOSITIONING LENGTH 30 (MISCELLANEOUS) ×4
SPONGE LAP 4X18 RFD (DISPOSABLE) ×4
STOPCOCK MORSE 400PSI 3WAY (MISCELLANEOUS) ×8
SUT ETHIBOND X763 2 0 SH 1 (SUTURE)
SUT GORETEX CV 4 TH 22 36 (SUTURE)
SUT GORETEX CV4 TH-18 (SUTURE)
SUT MNCRL AB 3-0 PS2 18 (SUTURE)
SUT PROLENE 5 0 C 1 36 (SUTURE)
SUT PROLENE 6 0 C 1 30 (SUTURE)
SUT SILK  1 MH (SUTURE) ×4
SUT SILK 1 MH (SUTURE) ×3
SUT VIC AB 2-0 CT1 27 (SUTURE)
SUT VIC AB 2-0 CT1 TAPERPNT 27 (SUTURE)
SUT VIC AB 2-0 CTX 36 (SUTURE)
SUT VIC AB 3-0 SH 8-18 (SUTURE)
SYR 50ML LL SCALE MARK (SYRINGE) ×4
SYR BULB IRRIGATION 50ML (SYRINGE)
SYR CONTROL 10ML LL (SYRINGE)
TAPE CLOTH SURG 4X10 WHT LF (GAUZE/BANDAGES/DRESSINGS) ×4
TOWEL GREEN STERILE (TOWEL DISPOSABLE) ×8
TRANSDUCER W/STOPCOCK (MISCELLANEOUS) ×8
TRAY FOLEY SLVR 14FR TEMP STAT (SET/KITS/TRAYS/PACK)
TUBE SUCT INTRACARD DLP 20F (MISCELLANEOUS)
VALVE HEART TRANSCATH SZ3 26MM (Prosthesis & Implant Heart) ×4 IMPLANT
WIRE .035 3MM-J 145CM (WIRE) ×4
WIRE BENTSON .035X145CM (WIRE) ×4

## 2018-07-29 NOTE — Progress Notes (Signed)
  [facility]   MULTIDISCIPLINARY HEART VALVE TEAM  Patient doing well s/p TAVR. She is mildly hypotensive but asymptomatic. HRs are in 30-40s. Some beats appear junctional. Initial post op ECG showed sinus brady with no high grade block. We will treat with IV fluids and watch rhythm closely. If HR and BP do not improve we may need to send to 2H.    Cline Crock PA-C  [facility]  Pager [phone]

## 2018-07-29 NOTE — Progress Notes (Signed)
Patient interviewed in the preop area. Patient able to confirm name, DOB, procedure, allergies, metal (only in mouth), npo status and no pain. CRNA and preop RN at bedside.   Yvetta Coder

## 2018-07-29 NOTE — Progress Notes (Signed)
Patient ID: Shannon Kidd, female   DOB: Apr 24, 1946, 72 y.o.   MRN: [id number] TCTS Evening Rounds:  Hemodynamically stable Rhythm is sinus 50's with occasional dropped p-waves.  Has reportedly had some rates in the 30's that was irregular. Was on dopamine earlier but off now. Groin sites look ok Awake and alert. Continue to observe rhythm closely in the ICU.

## 2018-07-29 NOTE — Anesthesia Preprocedure Evaluation (Signed)
Anesthesia Evaluation  Patient identified by MRN, date of birth, ID band Patient awake    Reviewed: Allergy & Precautions, H&P , NPO status , Patient's Chart, lab work & pertinent test results  Airway Mallampati: II   Neck ROM: full    Dental   Pulmonary shortness of breath,    breath sounds clear to auscultation       Cardiovascular hypertension, +CHF  + Valvular Problems/Murmurs  Rhythm:regular Rate:Normal     Neuro/Psych PSYCHIATRIC DISORDERS Anxiety    GI/Hepatic   Endo/Other  diabetes, Type 2Hypothyroidism obese  Renal/GU Renal InsufficiencyRenal disease     Musculoskeletal   Abdominal   Peds  Hematology   Anesthesia Other Findings   Reproductive/Obstetrics                             Anesthesia Physical Anesthesia Plan  ASA: III  Anesthesia Plan: MAC   Post-op Pain Management:    Induction: Intravenous  PONV Risk Score and Plan: 2 and Ondansetron, Propofol infusion and Treatment may vary due to age or medical condition  Airway Management Planned: Simple Face Mask  Additional Equipment:   Intra-op Plan:   Post-operative Plan:   Informed Consent: I have reviewed the patients History and Physical, chart, labs and discussed the procedure including the risks, benefits and alternatives for the proposed anesthesia with the patient or authorized representative who has indicated his/her understanding and acceptance.     Plan Discussed with: CRNA, Anesthesiologist and Surgeon  Anesthesia Plan Comments:         Anesthesia Quick Evaluation

## 2018-07-29 NOTE — Anesthesia Procedure Notes (Signed)
Central Venous Catheter Insertion Performed by: Achille Rich, MD, anesthesiologist Start/End10/22/2019 9:45 AM, 07/29/2018 9:58 AM Patient location: Pre-op. Preanesthetic checklist: patient identified, IV checked, site marked, risks and benefits discussed, surgical consent, monitors and equipment checked, pre-op evaluation, timeout performed and anesthesia consent Position: Trendelenburg Lidocaine 1% used for infiltration and patient sedated Hand hygiene performed , maximum sterile barriers used  and Seldinger technique used Catheter size: 7 Fr Central line was placed.Double lumen Procedure performed using ultrasound guided technique. Ultrasound Notes:anatomy identified, needle tip was noted to be adjacent to the nerve/plexus identified and no ultrasound evidence of intravascular and/or intraneural injection Attempts: 1 Following insertion, line sutured, dressing applied and Biopatch. Post procedure assessment: blood return through all ports, free fluid flow and no air  Patient tolerated the procedure well with no immediate complications.

## 2018-07-29 NOTE — Progress Notes (Signed)
Post-TAVR note: Pt awake, alert. Feels ok. HR sinus brady 35-40 bpm and BP 85-90 mmHg systolic. BL groin sites clear. Pt with no CP or shortness of breath.   Will start on low dose dopamine and tx to CV-ICU for immediate post-TAVR care. Hopefully she will remain stable overnight and be able to tx to tele bed in am. Discussed plan with patient and her family.  Tonny Bollman 07/29/2018 4:03 PM

## 2018-07-29 NOTE — Op Note (Signed)
[facility]   MULTIDISCIPLINARY HEART VALVE TEAM   TAVR OPERATIVE NOTE   Date of Procedure:  07/29/2018  Preoperative Diagnosis: Severe Aortic Stenosis   Postoperative Diagnosis: Same   Procedure:    Transcatheter Aortic Valve Replacement - Percutaneous Right Transfemoral Approach  Edwards Sapien 3 THV (size 26 mm, model # 9600TFX, serial # [id number])   Co-Surgeons:  Salvatore Decent. Cornelius Moras, MD and Tonny Bollman, MD  Anesthesiologist:  Achille Rich, MD  Echocardiographer:  Thurmon Fair, MD  Pre-operative Echo Findings:  Severe aortic stenosis  Normal left ventricular systolic function  Post-operative Echo Findings:  No paravalvular leak  Unchanged left ventricular systolic function   BRIEF CLINICAL NOTE AND INDICATIONS FOR SURGERY  [name] with known history of rheumatic fever during childhood, aortic stenosis, hypertension, type 2 diabetes with complications, and chronic kidney disease stage III who has been referred for surgical consultation to discuss treatment options for management of severe symptomatic aortic stenosis.  Patient states that she had Bright disease at age 32 and rheumatic fever at age 13.  She was noted to have a heart murmur during childhood but states that the heart murmur eventually resolved.  As she grew older her heart murmur became more pronounced and she was noted to have aortic stenosis.  For the last several years she has been followed by Dr. Jens Som with serial echocardiograms that have demonstrated normal left ventricular systolic function with aortic stenosis that has progressed in severity.  Beginning last March the patient began to experience progressive symptoms of exertional shortness of breath and fatigue.  Recent transthoracic echocardiogram documented the presence of severe aortic stenosis with peak velocity across the aortic valve measured 4.3 m/s corresponding to mean transvalvular gradient  estimated 45 mmHg.  The DVI was reported 0.23 with aortic valve area calculated 0.72 cm.  Left ventricular systolic function remain normal with ejection fraction estimated 65%.  The patient was referred to the multidisciplinary heart valve clinic and underwent left and right heart catheterization by Dr. Excell Seltzer.  Catheterization revealed mild nonobstructive coronary artery disease and confirmed the presence of severe aortic stenosis with mean transvalvular gradient measured 43 mmHg by catheterization.  There was moderate pulmonary hypertension.  Cardiothoracic surgical consultation was requested.  During the course of the patient's preoperative work up they have been evaluated comprehensively by a multidisciplinary team of specialists coordinated through the Multidisciplinary Heart Valve Clinic in the [facility].  They have been demonstrated to suffer from symptomatic severe aortic stenosis as noted above. The patient has been counseled extensively as to the relative risks and benefits of all options for the treatment of severe aortic stenosis including long term medical therapy, conventional surgery for aortic valve replacement, and transcatheter aortic valve replacement.  All questions have been answered, and the patient provides full informed consent for the operation as described.   DETAILS OF THE OPERATIVE PROCEDURE  PREPARATION:    The patient is brought to the operating room on the above mentioned date and central monitoring was established by the anesthesia team including placement of a central venous line and radial arterial line. The patient is placed in the supine position on the operating table.  Intravenous antibiotics are administered. The patient is monitored closely throughout the procedure under conscious sedation.  Baseline transthoracic echocardiogram was performed. The patient's chest, abdomen, both groins, and both lower extremities are prepared and draped  in a sterile manner. A time out procedure is performed.  PERIPHERAL ACCESS:    Using the modified Seldinger technique, femoral arterial and venous access was obtained with placement of 6 Fr sheaths on the left side.  A pigtail diagnostic catheter was passed through the left arterial sheath under fluoroscopic guidance into the aortic root.  A temporary transvenous pacemaker catheter was passed through the left femoral venous sheath under fluoroscopic guidance into the right ventricle.  The pacemaker was tested to ensure stable lead placement and pacemaker capture. Aortic root angiography was performed in order to determine the optimal angiographic angle for valve deployment.   TRANSFEMORAL ACCESS:   Percutaneous transfemoral access and sheath placement was performed using ultrasound guidance.  The right common femoral artery was cannulated using a micropuncture needle and appropriate location was verified using hand injection angiogram.  A pair of Abbott Perclose percutaneous closure devices were placed and a 6 French sheath replaced into the femoral artery.  The patient was heparinized systemically and ACT verified > 250 seconds.    A 14 Fr transfemoral E-sheath was introduced into the right common femoral artery after progressively dilating over an Amplatz superstiff wire. An AL-1 catheter was used to direct a straight-tip exchange length wire across the native aortic valve into the left ventricle. This was exchanged out for a pigtail catheter and position was confirmed in the LV apex. Simultaneous LV and Ao pressures were recorded.  The pigtail catheter was exchanged for an Amplatz Extra-stiff wire in the LV apex.  Echocardiography was utilized to confirm appropriate wire position and no sign of entanglement in the mitral subvalvular apparatus.   TRANSCATHETER HEART VALVE DEPLOYMENT:   An Edwards Sapien 3 transcatheter heart valve (size 26 mm, model #9600TFX, serial #[id number]) was prepared and  crimped per manufacturer's guidelines, and the proper orientation of the valve is confirmed on the [employer] delivery system. The valve was advanced through the introducer sheath using normal technique until in an appropriate position in the abdominal aorta beyond the sheath tip. The balloon was then retracted and using the fine-tuning wheel was centered on the valve. The valve was then advanced across the aortic arch using appropriate flexion of the catheter. The valve was carefully positioned across the aortic valve annulus. The Commander catheter was retracted using normal technique. Once final position of the valve has been confirmed by angiographic assessment, the valve is deployed while temporarily holding ventilation and during rapid ventricular pacing to maintain systolic blood pressure < 50 mmHg and pulse pressure < 10 mmHg. The balloon inflation is held for >3 seconds after reaching full deployment volume. Once the balloon has fully deflated the balloon is retracted into the ascending aorta and valve function is assessed using echocardiography. There is felt to be no paravalvular leak and no central aortic insufficiency.  The patient's hemodynamic recovery following valve deployment is good.  The deployment balloon and guidewire are both removed.    PROCEDURE COMPLETION:   The sheath was removed and femoral artery closure performed.  Protamine was administered once femoral arterial repair was complete. The temporary pacemaker, pigtail catheters and femoral sheaths were removed with manual pressure used for hemostasis.   The patient tolerated the procedure well and is transported to the surgical intensive care in stable condition. There were no immediate intraoperative complications. All sponge instrument and needle counts are verified correct at completion of the operation.   No blood products were administered during the operation.  The patient received a total of 55.1 mL of intravenous  contrast during the procedure.  Purcell Nails, MD 07/29/2018 12:00 PM

## 2018-07-29 NOTE — Progress Notes (Signed)
Redness and rash noticed around groin area on patient's left leg.  Skin in areas starting to peel.  Katie, PA notified and made aware.  Per Florentina Addison, patient is ok to proceed with surgery.    Surgeon notified at bedside.

## 2018-07-29 NOTE — H&P (Signed)
[facility]   MULTIDISCIPLINARY HEART VALVE CLINIC   HISTORY AND PHYSICAL EXAM  This is a 72 year old patient who is been followed by Dr. Stanford Breed.  The patient has a history of childhood rheumatic fever, type 2 diabetes, and stage III chronic kidney disease.  She has been followed for aortic stenosis with a known heart murmur for many years.  Serial echo studies have demonstrated expected progression of aortic stenosis over time.  The patient over the last 6 months has developed progressive shortness of breath and fatigue.  Her most recent echo study demonstrated severe aortic stenosis with a peak systolic velocity of 4.3 m/s and mean transvalvular gradient of 45 mmHg.  LV function has been normal with an LVEF of 65%.  Cardiac catheterization demonstrated patent coronary arteries with mild nonobstructive CAD and confirmed severe aortic stenosis with a mean transvalvular gradient of 43 mmHg.  The patient is functionally independent.  She cares for her husband who has dementia.  She admits to mild chest tightness when she becomes short of breath.  No exertional lightheadedness or presyncope.  No orthopnea, PND, or resting dyspnea.   During the course of the patient's preoperative work up they have been evaluated comprehensively by a multidisciplinary team of specialists coordinated through the [facility] in the [facility].  The patient has been counseled extensively as to the relative risks and benefits of all options for the treatment of severe aortic stenosis including long term medical therapy, conventional surgery for aortic valve replacement, and transcatheter aortic valve replacement.  The patient has been independently evaluated by Dr. Roxy Manns and is felt to be an appropriate candidate for TAVR via a transfemoral approach.  She presents today for surgery. The patient has been counseled at length regarding the indications, risks and  potential benefits of surgery.  All questions have been answered, and the patient now presents for surgery having provided full informed consent for the operation as planned.   Past Medical History:  Diagnosis Date  . Anxiety   . Cervical myelopathy (HCC)    s/p cervical fusion @ [facility]  . Chronic diastolic congestive heart failure ([place])   . CKD (chronic kidney disease), stage III ([place])   . Diabetes mellitus ([place])   . Glaucoma   . HTN (hypertension)   . Hyperlipemia   . Hypothyroid    When pregnant  . Morbid obesity ([employer])   . Rheumatic fever   . Severe aortic stenosis     Past Surgical History:  Procedure Laterality Date  . APPENDECTOMY    . CARDIAC CATHETERIZATION    . EYE SURGERY Bilateral    cataract extraction bilateral with lens  . NECK SURGERY    . RIGHT/LEFT HEART CATH AND CORONARY ANGIOGRAPHY N/A 06/04/2018   Procedure: RIGHT/LEFT HEART CATH AND CORONARY ANGIOGRAPHY;  Surgeon: Sherren Mocha, MD;  Location: [facility];  Service: Cardiovascular;  Laterality: N/A;  . TONSILLECTOMY    . TUBAL LIGATION    . VAGINAL HYSTERECTOMY      Family History  Problem Relation Age of Onset  . Diabetes Mother   . Cancer Mother        Colon  . Diabetes Sister   . Diabetes Paternal Grandmother   . Breast cancer Maternal Grandmother 75    Social History Social History   Tobacco Use  . Smoking status: Never Smoker  . Smokeless tobacco: Never Used  Substance Use Topics  . Alcohol use:  No    Alcohol/week: 0.0 standard drinks  . Drug use: Never    Prior to Admission medications   Medication Sig Start Date End Date Taking? Authorizing Provider  amLODipine (NORVASC) 5 MG tablet Take 1 tablet (5 mg total) by mouth daily. 05/16/18  Yes Lelon Perla, MD  Ascorbic Acid (VITAMIN C) 1000 MG tablet Take 1,000 mg by mouth daily.   Yes [provider]  aspirin 81 MG tablet Take 81 mg by mouth daily.   Yes [provider]  Cholecalciferol (VITAMIN  D) 2000 UNITS tablet Take 2,000 Units by mouth daily.   Yes [provider]  glipiZIDE (GLUCOTROL XL) 2.5 MG 24 hr tablet TAKE 1 TABLET BY MOUTH ONCE DAILY WITH BREAKFAST Patient taking differently: Take 2.5 mg by mouth daily with breakfast.  05/27/18  Yes Elayne Snare, MD  metFORMIN (GLUCOPHAGE-XR) 500 MG 24 hr tablet TAKE 2 TABLETS BY MOUTH ONCE DAILY WITH SUPPER. Patient taking differently: Take 500 mg by mouth every evening.  07/14/18  Yes Elayne Snare, MD  pramipexole (MIRAPEX) 0.25 MG tablet Take 0.25 mg by mouth at bedtime.    Yes [provider]  rosuvastatin (CRESTOR) 20 MG tablet Take 1 tablet (20 mg total) by mouth daily. 05/16/18  Yes Lelon Perla, MD  sertraline (ZOLOFT) 25 MG tablet Take 25 mg by mouth at bedtime.    Yes [provider]  vitamin B-12 (CYANOCOBALAMIN) 1000 MCG tablet Take 1,000 mcg by mouth daily.   Yes [provider]  albuterol (PROVENTIL HFA;VENTOLIN HFA) 108 (90 Base) MCG/ACT inhaler Inhale 2 puffs into the lungs every 6 (six) hours as needed for wheezing or shortness of breath.    [provider]  Blood Glucose Monitoring Suppl (ONETOUCH VERIO IQ SYSTEM) w/Device KIT USE TO CHECK BLOOD SUGAR ONCE DAILY Dx code E11.9 02/12/17   Elayne Snare, MD  [facility] DELICA LANCETS 21H [place] USE TO CHECK BLOOD SUGAR ONCE DAILY Dx code E11.9 02/12/17   Elayne Snare, MD  [facility] VERIO test strip USE 1 STRIP TO CHECK GLUCOSE ONCE DAILY Patient taking differently: 1 each by Other route daily.  02/27/18   Elayne Snare, MD    Allergies  Allergen Reactions  . Actos [Pioglitazone] Swelling     Review of Systems: General: no fevers/chills/night sweats/weight loss Eyes: no blurry vision, diplopia, or amaurosis ENT: no sore throat or hearing loss Resp: no cough, wheezing, or hemoptysis CV: Positive for edema and chest pain GI: no abdominal pain, nausea, vomiting, diarrhea, or constipation GU: no dysuria, frequency, or hematuria Skin: no  rash Neuro: no headache, numbness, tingling, or weakness of extremities.  Positive for dizziness. Musculoskeletal: no joint pain or swelling Heme: no bleeding, DVT, or easy bruising Endo: no polydipsia or polyuria.  Positive for the presence of diabetes.  Physical Exam: Pt is alert and oriented, WD, WN, in no distress. HEENT: normal Neck: JVP normal. Carotid upstrokes normal. No thyromegaly. Lungs: equal expansion, clear bilaterally CV: Apex is discrete and nondisplaced, RRR with 3/6 harsh systolic murmur at the RUSB Abd: soft, NT, +BS, no hepatosplenomegaly Back: no CVA tenderness Ext: trace pretibial edema bilaterally        DP/PT pulses intact and = Skin: warm and dry. There is erythema of the left groin area without skin breakdown Neuro: CNII-XII intact             Strength intact = bilaterally  Diagnostic Tests: 2D Echo: LV EF: 65% - 70%  ------------------------------------------------------------------- Indications: Aortic Valve  Stenosis (I35.0).  ------------------------------------------------------------------- History: PMH: HLD, Rheumatic fever. Dyspnea. Risk factors: Hypertension.  ------------------------------------------------------------------- Study Conclusions  - Left ventricle: The cavity size was normal. Wall thickness was increased in a pattern of mild LVH. Systolic function was vigorous. The estimated ejection fraction was in the range of 65% to 70%. Wall motion was normal; there were no regional wall motion abnormalities. Doppler parameters are consistent with abnormal left ventricular relaxation (grade 1 diastolic dysfunction). Doppler parameters are consistent with high ventricular filling pressure. - Aortic valve: Valve mobility was restricted. There was severe stenosis. There was mild regurgitation. Peak velocity (S): 425 cm/s. Mean gradient (S): 45 mm Hg. Valve area (VTI): 0.76 cm^2. Valve area (Vmax): 0.72  cm^2. Valve area (Vmean): 0.76 cm^2. - Mitral valve: Moderately calcified annulus. Valve area by pressure half-time: 2.22 cm^2. Valve area by continuity equation (using LVOT flow): 2.03 cm^2. - Right atrium: The atrium was mildly dilated. - Tricuspid valve: There was moderate regurgitation directed eccentrically. - Pulmonary arteries: Systolic pressure was mildly increased. PA peak pressure: 36 mm Hg (S).  Impressions:  - Aortic stenosis has progressed when compared to prior.  ------------------------------------------------------------------- Study data: Comparison was made to the study of 02/05/2017. Study status: Routine. Procedure: Transthoracic echocardiography. Image quality was adequate. Transthoracic echocardiography. M-mode, complete 2D, spectral Doppler, and color Doppler. Birthdate: Patient birthdate: Oct 19, 1945. Age: Patient is 72 yr old. Sex: Gender: female. BMI: 39.3 kg/m^2. Blood pressure: 177/92 Patient status: Outpatient. Study date: Study date: 05/21/2018. Study time: 07:35 AM. Location: [facility]  -------------------------------------------------------------------  ------------------------------------------------------------------- Left ventricle: The cavity size was normal. Wall thickness was increased in a pattern of mild LVH. Systolic function was vigorous. The estimated ejection fraction was in the range of 65% to 70%. Wall motion was normal; there were no regional wall motion abnormalities. Doppler parameters are consistent with abnormal left ventricular relaxation (grade 1 diastolic dysfunction). Doppler parameters are consistent with high ventricular filling pressure.  ------------------------------------------------------------------- Aortic valve: Trileaflet; severely thickened, severely calcified leaflets. Valve mobility was restricted. Doppler: There was severe stenosis. There was mild  regurgitation. VTI ratio of LVOT to aortic valve: 0.24. Valve area (VTI): 0.76 cm^2. Indexed valve area (VTI): 0.38 cm^2/m^2. Peak velocity ratio of LVOT to aortic valve: 0.23. Valve area (Vmax): 0.72 cm^2. Indexed valve area (Vmax): 0.36 cm^2/m^2. Mean velocity ratio of LVOT to aortic valve: 0.24. Valve area (Vmean): 0.76 cm^2. Indexed valve area (Vmean): 0.37 cm^2/m^2. Mean gradient (S): 45 mm Hg. Peak gradient (S): 72 mm Hg.  ------------------------------------------------------------------- Aorta: Aortic root: The aortic root was normal in size.  ------------------------------------------------------------------- Mitral valve: Moderately calcified annulus. Mobility was not restricted. Doppler: Transvalvular velocity was within the normal range. There was no evidence for stenosis. There was no regurgitation. Valve area by pressure half-time: 2.22 cm^2. Indexed valve area by pressure half-time: 1.1 cm^2/m^2. Valve area by continuity equation (using LVOT flow): 2.03 cm^2. Indexed valve area by continuity equation (using LVOT flow): 1.01 cm^2/m^2. Mean gradient (D): 2 mm Hg. Peak gradient (D): 5 mm Hg.  ------------------------------------------------------------------- Left atrium: The atrium was normal in size.  ------------------------------------------------------------------- Right ventricle: The cavity size was normal. Wall thickness was normal. Systolic function was normal.  ------------------------------------------------------------------- Pulmonic valve: Doppler: Transvalvular velocity was within the normal range. There was no evidence for stenosis.  ------------------------------------------------------------------- Tricuspid valve: Structurally normal valve. Doppler: Transvalvular velocity was within the normal range. There was moderate regurgitation directed  eccentrically.  ------------------------------------------------------------------- Pulmonary artery: The main pulmonary artery was normal-sized. Systolic pressure was mildly increased.  -------------------------------------------------------------------  Right atrium: The atrium was mildly dilated.  ------------------------------------------------------------------- Pericardium: There was no pericardial effusion.  ------------------------------------------------------------------- Systemic veins: Inferior vena cava: The vessel was normal in size. The respirophasic diameter changes were in the normal range (= 50%), consistent with normal central venous pressure. Diameter: 21 mm.  ------------------------------------------------------------------- Measurements  IVC Value Reference ID 21 mm ----------  Left ventricle Value Reference LV ID, ED, PLAX chordal (H) 52.8 mm 43 - 52 LV ID, ES, PLAX chordal 31.4 mm 23 - 38 LV fx shortening, PLAX chordal 41 % >=29 LV PW thickness, ED 14.4 mm ---------- IVS/LV PW ratio, ED 0.59 <=1.3 Stroke volume, 2D 87 ml ---------- Stroke volume/bsa, 2D 43 ml/m^2 ---------- LV e&', lateral 7.72 cm/s ---------- LV E/e&', lateral 14.64 ---------- LV e&', medial 5.77 cm/s ---------- LV E/e&', medial 19.58 ---------- LV e&', average 6.75 cm/s ---------- LV E/e&', average 16.75 ---------- Longitudinal strain, TDI 22 %  ----------  Ventricular septum Value Reference IVS thickness, ED 8.43 mm ----------  LVOT Value Reference LVOT ID, S 20 mm ---------- LVOT area 3.14 cm^2 ---------- LVOT peak velocity, S 97.3 cm/s ---------- LVOT mean velocity, S 75.8 cm/s ---------- LVOT VTI, S 27.8 cm ----------  Aortic valve Value Reference Aortic valve peak velocity, S 425 cm/s ---------- Aortic valve mean velocity, S 315 cm/s ---------- Aortic valve VTI, S 115 cm ---------- Aortic mean gradient, S 45 mm Hg ---------- Aortic peak gradient, S 72 mm Hg ---------- VTI ratio, LVOT/AV 0.24 ---------- Aortic valve area, VTI 0.76 cm^2 ---------- Aortic valve area/bsa, VTI 0.38 cm^2/m^2 ---------- Velocity ratio, peak, LVOT/AV 0.23 ---------- Aortic valve area, peak velocity 0.72 cm^2 ---------- Aortic valve area/bsa, peak 0.36 cm^2/m^2 ---------- velocity Velocity ratio, mean, LVOT/AV 0.24 ---------- Aortic valve area, mean velocity 0.76 cm^2 ---------- Aortic valve area/bsa, mean 0.37 cm^2/m^2 ---------- velocity Aortic regurg pressure half-time 583 ms ----------  Aorta Value Reference Aortic root ID, ED 30 mm ----------  Left atrium Value Reference LA ID, A-P, ES  40 mm ---------- LA ID/bsa, A-P 1.98 cm/m^2 <=2.2 LA volume, S 56.1 ml ---------- LA volume/bsa, S 27.8 ml/m^2 ---------- LA volume, ES, 1-p A4C 50.6 ml ---------- LA volume/bsa, ES, 1-p A4C 25.1 ml/m^2 ---------- LA volume, ES, 1-p A2C 54.6 ml ---------- LA volume/bsa, ES, 1-p A2C 27.1 ml/m^2 ----------  Mitral valve Value Reference Mitral E-wave peak velocity 113 cm/s ---------- Mitral A-wave peak velocity 144 cm/s ---------- Mitral mean velocity, D 63.2 cm/s ---------- Mitral deceleration time 151 ms 150 - 230 Mitral pressure half-time 99 ms ---------- Mitral mean gradient, D 2 mm Hg ---------- Mitral peak gradient, D 5 mm Hg ---------- Mitral E/A ratio, peak 0.8 ---------- Mitral valve area, PHT, DP 2.22 cm^2 ---------- Mitral valve area/bsa, PHT, DP 1.1 cm^2/m^2 ---------- Mitral valve area, LVOT 2.03 cm^2 ---------- continuity Mitral valve area/bsa, LVOT 1.01 cm^2/m^2 ---------- continuity Mitral annulus VTI, D 43.1 cm ----------  Pulmonary arteries Value Reference PA pressure, S, DP (H) 36 mm Hg <=30  Tricuspid valve Value Reference Tricuspid regurg peak velocity 288 cm/s ---------- Tricuspid peak RV-RA gradient 33 mm Hg ---------- Tricuspid maximal regurg 288 cm/s ---------- velocity,  PISA  Right atrium Value Reference RA ID, S-I, ES, A4C (H) 56.3 mm 34 - 49 RA area, ES, A4C (H) 20.6 cm^2 8.3 - 19.5 RA volume, ES, A/L 62.3 ml ---------- RA volume/bsa, ES, A/L 30.9 ml/m^2 ----------  Systemic veins Value Reference Estimated CVP 3 mm Hg ----------  Right ventricle Value Reference TAPSE 28 mm ---------- RV pressure, S, DP (  H) 36 mm Hg <=30 RV s&', lateral, S 18.1 cm/s ----------  Legend: (L) and (H) mark values outside specified reference range.  ------------------------------------------------------------------- Prepared and Electronically Authenticated by  Candee Furbish, M.D. 2019-08-14T11:04:50    RIGHT/LEFT HEART CATH AND CORONARY ANGIOGRAPHY  Conclusion     Prox LAD to Mid LAD lesion is 30% stenosed.  1. Calcified but patent coronary arteries with mild nonobstructive coronary artery disease. 2. Severe aortic stenosis with a mean gradient of 43 mmHg. Aortic valve area calculation is not accurate because of high pulmonary artery saturations leading to a very high cardiac output based on Fick method (study was done with the patient on supplemental oxygen by facemask) 3. Elevated LVEDP 4. Moderate pulmonary hypertension likely related to high left heart filling pressures/diastolic heart failure  Recommendations: Continued multidisciplinary evaluation for treatment of severe aortic stenosis. Pt will undergo CTA studies and formal cardiac surgical evaluation     Indications   Severe aortic stenosis [I35.0 (ICD-10-CM)]  Procedural Details/Technique   Technical Details INDICATION: Severe  symptomatic aortic stenosis  PROCEDURAL DETAILS: There was an indwelling IV in a right antecubital vein. Using normal sterile technique, the IV was changed out for a 5 Fr brachial sheath over a 0.018 inch wire. The right wrist was then prepped, draped, and anesthetized with 1% lidocaine. Using the modified Seldinger technique a 5/6 French Slender sheath was placed in the right radial artery. Intra-arterial verapamil was administered through the radial artery sheath. IV heparin was administered after a JR4 catheter was advanced into the central aorta. A Swan-Ganz catheter was used for the right heart catheterization. Standard protocol was followed for recording of right heart pressures and sampling of oxygen saturations. Fick cardiac output was calculated. Standard Judkins catheters were used for selective coronary angiography. The aortic valve is crossed with a JR4 catheter. LV pressure is recorded and an aortic valve pullback is performed. There were no immediate procedural complications. The patient was transferred to the post catheterization recovery area for further monitoring.     Estimated blood loss <50 mL.  During this procedure the patient was administered the following to achieve and maintain moderate conscious sedation: Versed 2 mg, Fentanyl 25 mcg, while the patient's heart rate, blood pressure, and oxygen saturation were continuously monitored. The period of conscious sedation was 22 minutes, of which I was present face-to-face 100% of this time.  Coronary Findings   Diagnostic  Dominance: Right  Left Anterior Descending  The LAD is calcified. The vessel reaches the LV apex. There are 2 diagonal branches without stenosis. The mid LAD has 30% nonobstructive stenosis. There are no significant stenoses throughout the LAD territory.  Prox LAD to Mid LAD lesion 30% stenosed  Prox LAD to Mid LAD lesion is 30% stenosed.  Left Circumflex  The vessel exhibits minimal luminal irregularities.  The circumflex is widely patent. The vessel has minor luminal irregularities. The vessel is large and supplies a large trifurcating obtuse marginal without significant stenosis  First Obtuse Marginal Branch  The vessel exhibits minimal luminal irregularities.  Second Obtuse Marginal Branch  The vessel exhibits minimal luminal irregularities.  Right Coronary Artery  The vessel exhibits minimal luminal irregularities. The right coronary artery is a dominant vessel. The vessel is heavily calcified but widely patent throughout with no stenosis.  Intervention   No interventions have been documented.  Coronary Diagrams   Diagnostic Diagram       Implants       No implant documentation for this case.  MERGE Images   Show images for CARDIAC CATHETERIZATION   Link to Procedure Log   Procedure Log    Hemo Data    Most Recent Value  Fick Cardiac Output 10.5 L/min  Fick Cardiac Output Index 5.67 (L/min)/BSA  Aortic Mean Gradient 43.2 mmHg  Aortic Peak Gradient 38 mmHg  Aortic Valve Area 1.73  Aortic Value Area Index 0.93 cm2/BSA  RA A Wave 17 mmHg  RA V Wave 14 mmHg  RA Mean 13 mmHg  RV Systolic Pressure 46 mmHg  RV Diastolic Pressure 9 mmHg  RV EDP 17 mmHg  PA Systolic Pressure 48 mmHg  PA Diastolic Pressure 22 mmHg  PA Mean 40 mmHg  PW A Wave 24 mmHg  PW V Wave 25 mmHg  PW Mean 20 mmHg  LV Systolic Pressure 226 mmHg  LV Diastolic Pressure 12 mmHg  LV EDP 28 mmHg  AOp Systolic Pressure 333 mmHg  AOp Diastolic Pressure 60 mmHg  AOp Mean Pressure 88 mmHg  LVp Systolic Pressure 545 mmHg  LVp Diastolic Pressure 12 mmHg  LVp EDP Pressure 34 mmHg  QP/QS 1  TPVR Index 7.05 HRUI     Cardiac TAVR CT  TECHNIQUE: The patient was scanned on a [employer] scanner. A 120 kV retrospective scan was triggered in the descending thoracic aorta at 111 HU's. Gantry rotation speed was 270 msecs and collimation was .9 mm. No beta blockade or nitro were given. The 3D  data set was reconstructed in 5% intervals of the R-R cycle. Systolic and diastolic phases were analyzed on a dedicated work station using MPR, MIP and VRT modes. The patient received 80 cc of contrast.  FINDINGS: Aortic Valve: Tri leaflet, calcified with restricted leaflet motion  Aorta: Moderate calcific atherosclerosis  Sinotubular Junction: 26 mm  Ascending Thoracic Aorta: 34 mm  Aortic Arch: 28 mm  Descending Thoracic Aorta: 22 mm  Sinus of Valsalva Measurements:  Non-coronary: 28.8 mm  Right - coronary: 29 mm  Left - coronary: 29.3 mm  Coronary Artery Height above Annulus:  Left Main: 11.1 mm above annulus  Right Coronary: 13.8 mm above annulus  Virtual Basal Annulus Measurements:  Maximum/Minimum Diameter: 21.9 mm x 26 mm  Perimeter: 76.7 mm  Area: 444 mm2  Coronary Arteries: Sufficient height above annulus for deployment  Optimum Fluoroscopic Angle for Delivery: LAO 14 Caudal 14 degrees  IMPRESSION: 1. Tri leaflet AV with annular area of 444 mm2 suitable for a 26 mm Sapien 3 valve  2. Optimum angiographic angle for deployment LAO 14 Caudal 14 degrees  3. No LAA thrombus  4. Normal aortic root 3.4 cm  5. Coronary arteries sufficient height for deployment  Jenkins Rouge   Electronically Signed By: Jenkins Rouge M.D. On: 06/25/2018 15:55   CT CHEST, ABDOMEN AND PELVIS WITHOUT CONTRAST  TECHNIQUE: Multidetector CT imaging of the chest, abdomen and pelvis was performed following the standard protocol without IV contrast.  COMPARISON: None.  FINDINGS: CTA CHEST FINDINGS  Cardiovascular: Heart size is normal. There is no significant pericardial fluid, thickening or pericardial calcification. There is aortic atherosclerosis, as well as atherosclerosis of the great vessels of the mediastinum and the coronary arteries, including calcified atherosclerotic plaque in the left main, left  anterior descending, left circumflex and right coronary arteries. Severe thickening calcification of the aortic valve. Calcifications of the mitral annulus.  Mediastinum/Lymph Nodes: No pathologically enlarged mediastinal or hilar lymph nodes. Esophagus is unremarkable in appearance. No axillary lymphadenopathy.  Lungs/Pleura: Scattered areas of linear scarring throughout  the mid to lower lungs bilaterally. No acute consolidative airspace disease. No pleural effusions. No suspicious appearing pulmonary nodules or masses are noted.  Musculoskeletal/Soft Tissues: There are no aggressive appearing lytic or blastic lesions noted in the visualized portions of the skeleton.  CTA ABDOMEN AND PELVIS FINDINGS  Hepatobiliary: No suspicious cystic or solid hepatic lesions. No intra or extrahepatic biliary ductal dilatation. Multiple small calcified gallstones lying dependently in the gallbladder. No findings to suggest an acute cholecystitis at this time.  Pancreas: No pancreatic mass. No pancreatic ductal dilatation. No pancreatic or peripancreatic fluid or inflammatory changes.  Spleen: Unremarkable.  Adrenals/Urinary Tract: Multiple subcentimeter low-attenuation lesions in both kidneys, too small to characterize, but statistically likely to represent cysts. Exophytic 1.2 cm simple cyst in the anterior aspect of the interpolar region of the left kidney. No suspicious renal lesions. Bilateral adrenal glands are normal in appearance. No hydroureteronephrosis. Urinary bladder is normal in appearance.  Stomach/Bowel: Normal appearance of the stomach. No pathologic dilatation of small bowel or colon. A few scattered colonic diverticulae are noted, particularly in the sigmoid colon, without surrounding inflammatory changes to suggest an acute diverticulitis at this time. Normal appendix.  Vascular/Lymphatic: Aortic atherosclerosis with vascular findings and measurements  pertinent to potential TAVR procedure, as detailed below. No aneurysm or dissection noted in the abdominal or pelvic vasculature. No lymphadenopathy noted in the abdomen or pelvis.  Reproductive: Status post hysterectomy. 4.6 x 2.7 x 3.7 cm low to intermediate attenuation lesion in the left adnexal region, likely an ovarian cyst. Right ovary is unremarkable in appearance.  Other: No significant volume of ascites. No pneumoperitoneum.  Musculoskeletal: There are no aggressive appearing lytic or blastic lesions noted in the visualized portions of the skeleton.  VASCULAR MEASUREMENTS PERTINENT TO TAVR:  AORTA:  Minimal Aortic Diameter-13 x 11 mm  Severity of Aortic Calcification-moderate  RIGHT PELVIS:  Right Common Iliac Artery -  Minimal Diameter-8.3 x 8.0 mm  Tortuosity-mild  Calcification-moderate to severe  Right External Iliac Artery -  Minimal Diameter-7.9 x 7.6 mm  Tortuosity-moderate  Calcification-none  Right Common Femoral Artery -  Minimal Diameter-7.5 x 7.8 mm  Tortuosity-mild  Calcification-none  LEFT PELVIS:  Left Common Iliac Artery -  Minimal Diameter-7.5 x 7.8 mm  Tortuosity-mild  Calcification-moderate to severe  Left External Iliac Artery -  Minimal Diameter-7.5 x 8.1 mm  Tortuosity-moderate  Calcification-none  Left Common Femoral Artery -  Minimal Diameter-7.1 x 6.7 mm  Tortuosity-mild  Calcification-none  Review of the MIP images confirms the above findings.  IMPRESSION: 1. Vascular findings and measurements pertinent to potential TAVR procedure, as detailed above. 2. Severe thickening calcification of the aortic valve, compatible with the reported clinical history of severe aortic stenosis. 3. Aortic atherosclerosis, in addition to left main and 3 vessel coronary artery disease. 4. 4.6 x 2.7 x 3.7 cm low to intermediate attenuation lesion in the left adnexal region, presumably  an ovarian cyst. Further evaluation with nonemergent pelvic ultrasound is recommended in the near future to better evaluate this finding. This recommendation follows ACR consensus guidelines: White Paper of the ACR Incidental Findings Committee II on Adnexal Findings. J Am Coll Radiol 2013:10:675-681. 5. Cholelithiasis without evidence of acute cholecystitis at this time. 6. Mild colonic diverticulosis without evidence of acute diverticulitis at this time.   Electronically Signed By: Vinnie Langton M.D. On: 06/25/2018 15:44   STS Risk Calculator  Procedure: Isolated AVR CALCULATE  Risk of Mortality:  1.744%   Renal Failure:  1.964%   Permanent  Stroke:  0.986%   Prolonged Ventilation:  7.175%   DSW Infection:  0.163%   Reoperation:  2.255%   Morbidity or Mortality:  10.914%   Short Length of Stay:  36.852%   Long Length of Stay:  5.009%     Impression: 72 year old woman with severe, stage D1, symptomatic aortic stenosis, possibly related to rheumatic heart valve disease.  The patient has New York Heart Association functional class III symptoms of chronic diastolic heart failure. She is the primary caretaker for her husband and certainly TAVR appears to be a reasonable treatment option in this patient with an absence of coronary artery disease who suffers from symptoms of severe aortic stenosis.  Plan:  We plan to proceed with transcatheter aortic valve replacement using the right transfemoral approach as an alternative to conventional aortic valve replacement.  The patient understands and accepts all potential associated risks of surgery including but not limited to risks of death, stroke, paravalvular leak, aortic dissection, other major vascular complications, aortic annulus rupture, device embolization, cardiac rupture or perforation, mitral regurgitation, acute myocardial infarction, arrhythmia, heart block or bradycardia requiring permanent pacemaker placement,  congestive heart failure, respiratory failure, renal failure, pneumonia, infection, pleural effusion, pericardial effusion or tamponade, pulmonary embolus or other thromboembolic complications, late complications related to structural valve deterioration or migration, or other complications that might ultimately cause a temporary or permanent loss of functional independence or other long term morbidity.  Following the decision to proceed with transcatheter aortic valve replacement, a discussion has also been held regarding what types of management strategies would be attempted intraoperatively in the event of life-threatening complications, including whether or not the patient would be considered a candidate for the use of cardiopulmonary bypass and/or conversion to open sternotomy for attempted surgical intervention.  The patient provides full informed consent for the procedure as planned and all questions have been answered.   Blane Ohara, M.D. 07/29/2018 9:59 AM

## 2018-07-29 NOTE — Transfer of Care (Signed)
Immediate Anesthesia Transfer of Care Note  Patient: Shannon Kidd  Procedure(s) Performed: TRANSCATHETER AORTIC VALVE REPLACEMENT, TRANSFEMORAL (N/A Chest) INTRAOPERATIVE TRANSTHORACIC ECHOCARDIOGRAM (Chest)  Patient Location: PACU and Cath Lab  Anesthesia Type:MAC  Level of Consciousness: awake and alert   Airway & Oxygen Therapy: Patient Spontanous Breathing and Patient connected to nasal cannula oxygen  Post-op Assessment: Report given to RN and Post -op Vital signs reviewed and stable  Post vital signs: Reviewed and stable  Last Vitals:  Vitals Value Taken Time  BP    Temp    Pulse    Resp    SpO2      Last Pain:  Vitals:   07/29/18 1000  TempSrc:   PainSc: 0-No pain      Patients Stated Pain Goal: 3 (56/21/30 8657)  Complications: No apparent anesthesia complications

## 2018-07-29 NOTE — Op Note (Signed)
[facility]   MULTIDISCIPLINARY HEART VALVE TEAM   TAVR OPERATIVE NOTE   Date of Procedure:  07/29/2018  Preoperative Diagnosis: Severe Aortic Stenosis   Postoperative Diagnosis: Same   Procedure:    Transcatheter Aortic Valve Replacement - Percutaneous  Transfemoral Approach  Edwards Sapien 3 THV (size 26 mm, model # 9600TFX, serial # [id number])   Co-Surgeons:  Valentina Gu. Roxy Manns, MD and Sherren Mocha, MD  Anesthesiologist:  Albertha Ghee, MD  Echocardiographer:  Sanda Klein, MD  Pre-operative Echo Findings:       severe aortic stenosis  normal left ventricular systolic function  Post-operative Echo Findings:  no paravalvular leak  normal left ventricular systolic function  BRIEF CLINICAL NOTE AND INDICATIONS FOR SURGERY  Please see the complete operative note of Dr. Roxy Manns for full indications and history.  Following the decision to proceed with transcatheter aortic valve replacement, a discussion has been held regarding what types of management strategies would be attempted intraoperatively in the event of life-threatening complications, including whether or not the patient would be considered a candidate for the use of cardiopulmonary bypass and/or conversion to open sternotomy for attempted surgical intervention.  The patient has been advised of a variety of complications that might develop peculiar to this approach including but not limited to risks of death, stroke, paravalvular leak, aortic dissection or other major vascular complications, aortic annulus rupture, device embolization, cardiac rupture or perforation, acute myocardial infarction, arrhythmia, heart block or bradycardia requiring permanent pacemaker placement, congestive heart failure, respiratory failure, renal failure, pneumonia, infection, other late complications related to structural valve deterioration or migration, or other complications that might ultimately cause a temporary or  permanent loss of functional independence or other long term morbidity.  The patient provides full informed consent for the procedure as described and all questions were answered preoperatively.  DETAILS OF THE OPERATIVE PROCEDURE  PREPARATION:   The patient is brought to the operating room on the above mentioned date and central monitoring was established by the anesthesia team including placement of a central venous catheter and radial arterial line. The patient is placed in the supine position on the operating table.  Intravenous antibiotics are administered. The patient is monitored closely throughout the procedure under conscious sedation.  Baseline transthoracic echocardiogram is performed. The patient's chest, abdomen, both groins, and both lower extremities are prepared and draped in a sterile manner. A time out procedure is performed.   PERIPHERAL ACCESS:   Using ultrasound guidance, femoral arterial and venous access is obtained with placement of 6 Fr sheaths on the left side.  A pigtail diagnostic catheter was passed through the femoral arterial sheath under fluoroscopic guidance into the aortic root.  A temporary transvenous pacemaker catheter was passed through the femoral venous sheath under fluoroscopic guidance into the right ventricle.  The pacemaker was tested to ensure stable lead placement and pacemaker capture. Aortic root angiography was performed in order to determine the optimal angiographic angle for valve deployment.  TRANSFEMORAL ACCESS:  A micropuncture technique is used to access the right femoral artery under fluoroscopic and ultrasound guidance.  2 Perclose devices are deployed at 10' and 2' positions to 'PreClose' the femoral artery. An 8 French sheath is placed and then an Amplatz Superstiff wire is advanced through the sheath. This is changed out for a 14 French transfemoral E-Sheath after progressively dilating over the Superstiff wire.  An AL-1 catheter was used to  direct a straight-tip exchange length wire across the native aortic valve into  the left ventricle. The straight wire would not cross the aortic valve but we were able to advance a pigtail across the valve. Simultaneous LV and Ao pressures were recorded.  The pigtail catheter was exchanged for an Amplatz Extra-stiff wire in the LV apex.  Echocardiography was utilized to confirm appropriate wire position and no sign of entanglement in the mitral subvalvular apparatus.  BALLOON AORTIC VALVULOPLASTY:  Not performed  TRANSCATHETER HEART VALVE DEPLOYMENT:  An Edwards Sapien 3 transcatheter heart valve (size 26 mm, model #9600TFX, serial [id number]) was prepared and crimped per manufacturer's guidelines, and the proper orientation of the valve is confirmed on the [employer] delivery system. The valve was advanced through the introducer sheath using normal technique until in an appropriate position in the abdominal aorta beyond the sheath tip. The balloon was then retracted and using the fine-tuning wheel was centered on the valve. The valve was then advanced across the aortic arch using appropriate flexion of the catheter. The valve was carefully positioned across the aortic valve annulus. The Commander catheter was retracted using normal technique. Once final position of the valve has been confirmed by angiographic assessment, the valve is deployed while temporarily holding ventilation and during rapid ventricular pacing to maintain systolic blood pressure < 50 mmHg and pulse pressure < 10 mmHg. The balloon inflation is held for >3 seconds after reaching full deployment volume. Once the balloon has fully deflated the balloon is retracted into the ascending aorta and valve function is assessed using echocardiography. There is felt to be no paravalvular leak and no central aortic insufficiency.  The patient's hemodynamic recovery following valve deployment is good.  The deployment balloon and guidewire are both  removed.   PROCEDURE COMPLETION:  The sheath was removed and femoral artery closure is performed using the 2 previously deployed Perclose devices.  Protamine is administered once femoral arterial repair was complete. The site is clear with no evidence of bleeding or hematoma after the sutures are tightened. The temporary pacemaker, pigtail catheters and femoral sheaths were removed with manual pressure used for hemostasis.   The patient tolerated the procedure well and is transported to the surgical intensive care in stable condition. There were no immediate intraoperative complications. All sponge instrument and needle counts are verified correct at completion of the operation.   The patient received a total of 55 mL of intravenous contrast during the procedure.   Sherren Mocha, MD 07/29/2018 12:10 PM

## 2018-07-29 NOTE — Anesthesia Procedure Notes (Signed)
Arterial Line Insertion Start/End10/22/2019 9:32 AM, 07/29/2018 9:35 AM Performed by: Dorie Rank, CRNA, CRNA  Preanesthetic checklist: patient identified, IV checked, site marked, risks and benefits discussed, surgical consent, monitors and equipment checked, pre-op evaluation and timeout performed Lidocaine 1% used for infiltration Left, radial was placed Catheter size: 20 G Hand hygiene performed , maximum sterile barriers used  and Seldinger technique used Allen's test indicative of satisfactory collateral circulation Attempts: 1 Procedure performed without using ultrasound guided technique. Following insertion, dressing applied and Biopatch. Post procedure assessment: normal  Patient tolerated the procedure well with no immediate complications.

## 2018-07-30 ENCOUNTER — Inpatient Hospital Stay (HOSPITAL_COMMUNITY): Payer: Medicare Other

## 2018-07-30 ENCOUNTER — Encounter (HOSPITAL_COMMUNITY): Payer: Self-pay | Admitting: Cardiovascular Disease

## 2018-07-30 DIAGNOSIS — Z952 Presence of prosthetic heart valve: Secondary | ICD-10-CM

## 2018-07-30 DIAGNOSIS — I350 Nonrheumatic aortic (valve) stenosis: Secondary | ICD-10-CM

## 2018-07-30 DIAGNOSIS — I5030 Unspecified diastolic (congestive) heart failure: Secondary | ICD-10-CM

## 2018-07-30 LAB — BASIC METABOLIC PANEL WITH GFR
Anion gap: 5 (ref 5–15)
BUN: 23 mg/dL (ref 8–23)
CO2: 26 mmol/L (ref 22–32)
Calcium: 8.5 mg/dL — ABNORMAL LOW (ref 8.9–10.3)
Chloride: 106 mmol/L (ref 98–111)
Creatinine, Ser: 1.23 mg/dL — ABNORMAL HIGH (ref 0.44–1.00)
GFR calc Af Amer: 50 mL/min — ABNORMAL LOW
GFR calc non Af Amer: 43 mL/min — ABNORMAL LOW
Glucose, Bld: 133 mg/dL — ABNORMAL HIGH (ref 70–99)
Potassium: 4.4 mmol/L (ref 3.5–5.1)
Sodium: 137 mmol/L (ref 135–145)

## 2018-07-30 LAB — MAGNESIUM: Magnesium: 2 mg/dL (ref 1.7–2.4)

## 2018-07-30 LAB — GLUCOSE, CAPILLARY
Glucose-Capillary: 146 mg/dL — ABNORMAL HIGH (ref 70–99)
Glucose-Capillary: 163 mg/dL — ABNORMAL HIGH (ref 70–99)
Glucose-Capillary: 149 mg/dL — ABNORMAL HIGH (ref 70–99)
Glucose-Capillary: 170 mg/dL — ABNORMAL HIGH (ref 70–99)

## 2018-07-30 LAB — CBC
HCT: 35.8 % — ABNORMAL LOW (ref 36.0–46.0)
Hemoglobin: 10.3 g/dL — ABNORMAL LOW (ref 12.0–15.0)
MCH: 27 pg (ref 26.0–34.0)
MCHC: 28.8 g/dL — ABNORMAL LOW (ref 30.0–36.0)
MCV: 93.7 fL (ref 80.0–100.0)
Platelets: 194 10*3/uL (ref 150–400)
RBC: 3.82 MIL/uL — ABNORMAL LOW (ref 3.87–5.11)
RDW: 14.7 % (ref 11.5–15.5)
WBC: 10.9 10*3/uL — ABNORMAL HIGH (ref 4.0–10.5)
nRBC: 0 % (ref 0.0–0.2)

## 2018-07-30 LAB — ECHOCARDIOGRAM COMPLETE
Height: 60 in
Weight: 3259.28 [oz_av]

## 2018-07-30 MED ORDER — NYSTATIN 100000 UNIT/GM EX CREA
TOPICAL_CREAM | Freq: Two times a day (BID) | CUTANEOUS | Status: DC
  Administered 2018-07-30 (×2): via TOPICAL
  Administered 2018-07-31: 1 "application " via TOPICAL
  Filled 2018-07-30: qty 15

## 2018-07-30 MED ORDER — AMLODIPINE BESYLATE 5 MG PO TABS
5.0000 mg | ORAL_TABLET | Freq: Once | ORAL | Status: AC
  Administered 2018-07-30: 5 mg via ORAL
  Filled 2018-07-30: qty 1

## 2018-07-30 MED ORDER — AMLODIPINE BESYLATE 5 MG PO TABS
5.0000 mg | ORAL_TABLET | Freq: Every day | ORAL | Status: DC
  Administered 2018-07-30: 5 mg via ORAL
  Filled 2018-07-30: qty 1

## 2018-07-30 MED ORDER — AMLODIPINE BESYLATE 10 MG PO TABS
10.0000 mg | ORAL_TABLET | Freq: Every day | ORAL | Status: DC
  Administered 2018-07-31: 10 mg via ORAL
  Filled 2018-07-30: qty 1

## 2018-07-30 MED FILL — Potassium Chloride Inj 2 mEq/ML: INTRAVENOUS | Qty: 40 | Status: AC

## 2018-07-30 MED FILL — Magnesium Sulfate Inj 50%: INTRAMUSCULAR | Qty: 10 | Status: AC

## 2018-07-30 MED FILL — Heparin Sodium (Porcine) Inj 1000 Unit/ML: INTRAMUSCULAR | Qty: 30 | Status: AC

## 2018-07-30 MED FILL — Phenylephrine HCl IV Soln 10 MG/ML: INTRAVENOUS | Qty: 2 | Status: AC

## 2018-07-30 MED FILL — Sodium Chloride IV Soln 0.9%: INTRAVENOUS | Qty: 250 | Status: AC

## 2018-07-30 NOTE — Progress Notes (Signed)
VAST RN to pt's bedside to evaluate dc of CL as ordered. Spoke with unit RN who stated she was preparing to discontinue line now.

## 2018-07-30 NOTE — Progress Notes (Signed)
Paged Dr. Excell Seltzer about patient having hypertension. Awaiting new orders. Will continue to monitor closely.

## 2018-07-30 NOTE — Discharge Instructions (Signed)

## 2018-07-30 NOTE — Progress Notes (Signed)
CARDIAC REHAB PHASE I   PRE:  Rate/Rhythm: 64 SR  BP:  Supine: 147/57  Sitting:   Standing:    SaO2: 95%RA  MODE:  Ambulation: 200 ft   POST:  Rate/Rhythm: 84 SR  BP:  Supine:   Sitting: 155/60  Standing:    SaO2: 96%RA 1400-1432 Pt walked to bathroom and then in hallway for 200 ft. Put on panty and pad as pt had some urinary urgency upon standing. Walked with gait belt use, rolling walker and asst x 1. Tolerated well. A little lightheaded that got better with activity. Pt happy that she could tell a difference in breathing with activity.   Luetta Nutting, RN BSN  07/30/2018 2:29 PM

## 2018-07-30 NOTE — Progress Notes (Addendum)
[facility] VALVE TEAM  Patient Name: Shannon Kidd Date of Encounter: 07/30/2018  Primary Cardiologist: Dr. Stanford Breed / Dr. Burt Knack & Dr. Roxy Manns (TAVR)  Hospital Problem List     Principal Problem:   S/P TAVR (transcatheter aortic valve replacement) Active Problems:   Kidney disease, chronic, stage III (moderate, EGFR 30-59 ml/min) (HCC)   Essential hypertension, benign   HLD (hyperlipidemia)   Severe obesity (BMI >= 40) (HCC)   Chronic diastolic congestive heart failure (HCC)   Diabetes mellitus ([place])   Severe aortic stenosis     Subjective   No complaints. Feeling good. Wants central line out.   Inpatient Medications    Scheduled Meds: . aspirin  81 mg Oral Daily  . clopidogrel  75 mg Oral Q breakfast  . insulin aspart  0-24 Units Subcutaneous TID AC & HS  . pramipexole  0.25 mg Oral QHS  . rosuvastatin  20 mg Oral Daily  . sertraline  25 mg Oral QHS  . sodium chloride flush  3 mL Intravenous Q12H   Continuous Infusions: . sodium chloride 10 mL/hr at 07/30/18 0800  . cefUROXime (ZINACEF)  IV Stopped (07/30/18 0630)  . nitroGLYCERIN    . phenylephrine (NEO-SYNEPHRINE) Adult infusion     PRN Meds: sodium chloride, acetaminophen **OR** acetaminophen, metoprolol tartrate, morphine injection, ondansetron (ZOFRAN) IV, oxyCODONE, sodium chloride flush, traMADol   Vital Signs    Vitals:   07/30/18 0645 07/30/18 0700 07/30/18 0752 07/30/18 0800  BP:  (!) 122/53  (!) 154/64  Pulse: (!) 52 (!) 49  66  Resp: '17 17  19  ' Temp:   97.7 F (36.5 C)   TempSrc:   Oral   SpO2: (!) 21% 98%  95%  Weight:      Height:        Intake/Output Summary (Last 24 hours) at 07/30/2018 0858 Last data filed at 07/30/2018 0800 Gross per 24 hour  Intake 2445.4 ml  Output 525 ml  Net 1920.4 ml   Filed Weights   07/29/18 0816 07/30/18 0500  Weight: 90.6 kg 92.4 kg    Physical Exam   GEN: Well nourished, well developed, in no acute  distress. obese HEENT: Grossly normal.  Neck: Supple, no JVD, carotid bruits, or masses. Cardiac: RRR, no murmurs, rubs, or gallops. No clubbing, cyanosis, edema.  Radials/DP/PT 2+ and equal bilaterally.  Respiratory:  Respirations regular and unlabored, clear to auscultation bilaterally. GI: Soft, nontender, nondistended, BS + x 4. MS: no deformity or atrophy. Skin: warm and dry, no rash. Groin sites with no hematoma or ecchymosis. Left groin with pruritic, erythematous rash that looks improved from yesterday  Neuro:  Strength and sensation are intact. Psych: AAOx3.  Normal affect.  Labs    CBC Recent Labs    07/29/18 1707 07/30/18 0320  WBC  --  10.9*  HGB 10.9* 10.3*  HCT 32.0* 35.8*  MCV  --  93.7  PLT  --  974   Basic Metabolic Panel Recent Labs    07/29/18 1232 07/29/18 1707 07/30/18 0320  NA 140 142 137  K 4.2 4.4 4.4  CL 104  --  106  CO2  --   --  26  GLUCOSE 212* 117* 133*  BUN 22  --  23  CREATININE 1.10*  --  1.23*  CALCIUM  --   --  8.5*  MG  --   --  2.0   Liver Function Tests No results for input(s):  AST, ALT, ALKPHOS, BILITOT, PROT, ALBUMIN in the last 72 hours. No results for input(s): LIPASE, AMYLASE in the last 72 hours. Cardiac Enzymes No results for input(s): CKTOTAL, CKMB, CKMBINDEX, TROPONINI in the last 72 hours. BNP Invalid input(s): POCBNP D-Dimer No results for input(s): DDIMER in the last 72 hours. Hemoglobin A1C No results for input(s): HGBA1C in the last 72 hours. Fasting Lipid Panel No results for input(s): CHOL, HDL, LDLCALC, TRIG, CHOLHDL, LDLDIRECT in the last 72 hours. Thyroid Function Tests No results for input(s): TSH, T4TOTAL, T3FREE, THYROIDAB in the last 72 hours.  Invalid input(s): FREET3  Telemetry    Sinus with PACs and rare PVC - Personally Reviewed  ECG    Sinus brady with sinus arrhythmia  - Personally Reviewed  Radiology    Dg Chest Port 1 View  Result Date: 07/29/2018 CLINICAL DATA:  Postop EXAM:  PORTABLE CHEST 1 VIEW COMPARISON:  07/25/2018 FINDINGS: Right IJ central venous catheter tip over the SVC. Interval aortic valve replacement. Mild cardiomegaly with aortic atherosclerosis. No pneumothorax, pleural effusion or focal opacity IMPRESSION: 1. Placement of right IJ central venous catheter with tip over the SVC. No pneumothorax. Interval valve replacement 2. Cardiomegaly without edema. Electronically Signed   By: Donavan Foil M.D.   On: 07/29/2018 17:53    Cardiac Studies    TAVR OPERATIVE NOTE   Date of Procedure:                07/29/2018  Preoperative Diagnosis:      Severe Aortic Stenosis   Postoperative Diagnosis:    Same   Procedure:        Transcatheter Aortic Valve Replacement - Percutaneous Right Transfemoral Approach             Edwards Sapien 3 THV (size 26 mm, model # 9600TFX, serial # [id number])              Co-Surgeons:                        Valentina Gu. Roxy Manns, MD and Sherren Mocha, MD  Pre-operative Echo Findings: ? Severe aortic stenosis ? Normal left ventricular systolic function  Post-operative Echo Findings: ? No paravalvular leak ? Unchanged left ventricular systolic function  ___________________  Post operative echo 07/30/18: pending  Patient Profile     Shannon Kidd is a 72 y.o. female with a history of morbid obesity, HTN, HLD, DMT2, CKD stage II and severe AS who presented to [facility] on 07/29/18 for planned TAVR.   Assessment & Plan    Severe AS: s/p successful TAVR with a 26 mm Edwards Sapien 3 THV via the TF approach on 07/29/18. Post operative echo pending. Groin sites are stable. She had some post operative bradycardia and hypotension that required short period of support with dopamine. ECG with sinus bradycardia and no high grade heart block. Continue ASA and plavix. Plan to transfer to 4E today with hopeful discharge home tomorrow.   HTN: BP mildly elevated, will resume home amlodipine 5 mg daily  DMT2: continue SSI   CKD  stage II: creat stable at 1.23  Morbid obesity: Body mass index is 39.78 kg/m.   Groin rash: Left groin with pruritic, erythematous rash that looks improved from yesterday. Will try some nystatin cream.   Signed, Angelena Form, PA-C  07/30/2018, 8:58 AM  Pager [phone]  Patient seen, examined. Available data reviewed. Agree with findings, assessment, and plan as outlined by Nell Range, PA-C.  On exam, the patient is alert and oriented in no distress.  Lung fields are clear, heart is regular rate and rhythm with a grade 2/6 systolic murmur at the right upper sternal border, no diastolic murmur, JVP normal, abdomen soft and nontender, bilateral groin sites clear, no pretibial edema.  The patient is doing very well after TAVR yesterday.  Her heart rhythm has stabilized and she is in sinus rhythm this morning with a narrow QRS.  She has a few junctional beats but no pathologic pauses.  I think she can be transferred to a telemetry bed with plans for hospital discharge tomorrow.  She will continue on aspirin and clopidogrel as well as medical therapy outlined above.  Would avoid any AV nodal blocking agents.  Sherren Mocha, M.D. 07/30/2018 10:44 AM

## 2018-07-30 NOTE — Progress Notes (Signed)
  Echocardiogram 2D Echocardiogram has been performed.  Shannon Kidd T Shannon Kidd 07/30/2018, 10:46 AM

## 2018-07-31 LAB — CBC
HCT: 35.7 % — ABNORMAL LOW (ref 36.0–46.0)
Hemoglobin: 10.5 g/dL — ABNORMAL LOW (ref 12.0–15.0)
MCH: 27.1 pg (ref 26.0–34.0)
MCHC: 29.4 g/dL — ABNORMAL LOW (ref 30.0–36.0)
MCV: 92.2 fL (ref 80.0–100.0)
Platelets: 205 10*3/uL (ref 150–400)
RBC: 3.87 MIL/uL (ref 3.87–5.11)
RDW: 14.6 % (ref 11.5–15.5)
WBC: 9.4 10*3/uL (ref 4.0–10.5)
nRBC: 0 % (ref 0.0–0.2)

## 2018-07-31 LAB — BASIC METABOLIC PANEL WITH GFR
Anion gap: 4 — ABNORMAL LOW (ref 5–15)
BUN: 17 mg/dL (ref 8–23)
CO2: 29 mmol/L (ref 22–32)
Calcium: 8.7 mg/dL — ABNORMAL LOW (ref 8.9–10.3)
Chloride: 106 mmol/L (ref 98–111)
Creatinine, Ser: 1.26 mg/dL — ABNORMAL HIGH (ref 0.44–1.00)
GFR calc Af Amer: 48 mL/min — ABNORMAL LOW
GFR calc non Af Amer: 42 mL/min — ABNORMAL LOW
Glucose, Bld: 133 mg/dL — ABNORMAL HIGH (ref 70–99)
Potassium: 4.8 mmol/L (ref 3.5–5.1)
Sodium: 139 mmol/L (ref 135–145)

## 2018-07-31 LAB — GLUCOSE, CAPILLARY
Glucose-Capillary: 152 mg/dL — ABNORMAL HIGH (ref 70–99)
Glucose-Capillary: 144 mg/dL — ABNORMAL HIGH (ref 70–99)

## 2018-07-31 MED ORDER — CLOPIDOGREL BISULFATE 75 MG PO TABS: 75.0000 mg | ORAL_TABLET | Freq: Every day | ORAL | 1 refills | Status: DC

## 2018-07-31 MED ORDER — NYSTATIN 100000 UNIT/GM EX CREA: TOPICAL_CREAM | Freq: Two times a day (BID) | CUTANEOUS | 12 refills | Status: DC

## 2018-07-31 NOTE — Progress Notes (Addendum)
Discharge information discussed and questions answered with pt and family. Pt had no further questions.   Discharged home with daughter.

## 2018-07-31 NOTE — Consult Note (Signed)
            [facility] CM Primary Care Navigator  07/31/2018  Shannon Kidd 27-May-1946 [id number]    Attempt to seepatient at the bedside to identify possible discharge needs butshewasalreadydischargedhomeper staff.  Per MD note,patient was admitted for severe, symptomatic aortic stenosis and underwent TAVR- transcatheter aortic valve replacement.  Patient has discharge instruction to follow-up withcardiology on 08/07/18.  Primary care provider's office is listed as providing transition of care (TOC) follow-up.    For additional questions please contact:  Edwena Felty A. Suha Schoenbeck, BSN, RN-BC [facility] PRIMARY CARE Navigator Cell: [phone]

## 2018-07-31 NOTE — Discharge Summary (Addendum)
[facility]VE TEAM  Discharge Summary    Patient ID: Shannon Kidd MRN: [id number]; DOB: 04/08/1946  Admit date: 07/29/2018 Discharge date: 07/31/2018  Primary Care Provider: Leighton Ruff, MD  Primary Cardiologist: Dr. Stanford Breed / Dr. Burt Knack & Dr. Roxy Manns (TAVR)  Discharge Diagnoses    Principal Problem:   S/P TAVR (transcatheter aortic valve replacement) Active Problems:   Kidney disease, chronic, stage III (moderate, EGFR 30-59 ml/min) (HCC)   Essential hypertension, benign   HLD (hyperlipidemia)   Severe obesity (BMI >= 40) (HCC)   Chronic diastolic congestive heart failure (HCC)   Diabetes mellitus (HCC)   Severe aortic stenosis   Allergies Allergies  Allergen Reactions  . Actos [Pioglitazone] Swelling    Diagnostic Studies/Procedures    TAVR OPERATIVE NOTE   Date of Procedure:07/29/2018  Preoperative Diagnosis:Severe Aortic Stenosis  Procedure:   Transcatheter Aortic Valve Replacement - PercutaneousRightTransfemoral Approach Edwards Sapien 3 THV (size 53m, model # 9600TFX, serial # [id number]  Co-Surgeons:Clarence H. ORoxy Manns MD and MSherren Mocha MD  Pre-operative Echo Findings: ? Severe aortic stenosis ? Normalleft ventricular systolic function  Post-operative Echo Findings: ? Noparavalvular leak ? Unchangedleft ventricular systolic function  ___________________  Post operative echo 07/30/18 Study Conclusions - Left ventricle: The cavity size was normal. Wall thickness was   normal. Systolic function was normal. The estimated ejection   fraction was in the range of 60% to 65%. Wall motion was normal;   there were no regional wall motion abnormalities. - Aortic valve: A bioprosthesis was present. Valve area (VTI): 1.35   cm^2. Valve area (Vmean): 1.14 cm^2. - Mitral valve: Mildly calcified annulus. Mildly thickened leaflets       History of Present Illness     Shannon DOEBLERis a 72y.o. female with a history of morbid obesity, HTN, HLD, DMT2, CKD stage II and severe AS [facility] planned [facility].  Patient states that she had Bright disease at age [age]and rheumatic fever at age 89 She was noted to have a heart murmur during childhood but states that the heart murmur eventually resolved. As she grew older her heart murmur became more pronounced and she was noted to have aortic stenosis. For the last several years she has been followed by Dr. CStanford Breedwith serial echocardiograms that have demonstrated normal left ventricular systolic function with aortic stenosis that has progressed in severity. Beginning last March the patient began to experience progressive symptoms of exertional shortness of breath and fatigue. Recent transthoracic echocardiogram documented the presence of severe aortic stenosis with peak velocity across the aortic valve measured 4.3 m/s corresponding to mean transvalvular gradient estimated 45 mmHg. The DVI was reported 0.23 with aortic valve area calculated 0.72 cm. Left ventricular systolic function remain normal with ejection fraction estimated 65%. The patient was referred to the multidisciplinary heart valve clinic and underwent left and right heart catheterization by Dr. CBurt Knackwhich revealed mild nonobstructive coronary artery disease and confirmed the presence of severe aortic stenosis with mean transvalvular gradient measured 43 mmHg.  The patient has been evaluated by the multidisciplinary valve team and felt to have severe, symptomatic aortic stenosis and to be a suitable candidate for TAVR, which was set up for 07/29/18.    Hospital Course     Consultants: none  Severe AS:s/p successful TAVR with a 26 mm Edwards Sapien 3 THV via the TF approach on 07/29/18. Post operative echo showed EF 60%, normally functioning TAVR  with mean gradient of 14 mm Hg and no PVL.  Groin sites are stable. She had some post operative bradycardia and hypotension that required short period of support with dopamine. ECG with sinus bradycardia and no high grade heart block. Continue ASA and plavix. Plan to discharge home today with 1 week follow up in the office.   HTN: BP well controlled back on home meds.   DMT2: treated with SSI while admitted. Resume home meds, including Metformin at discharge  CKD stage II: creat stable at 1.26  Morbid obesity: Body mass index is 39.78 kg/m.   Groin rash: Left groin with pruritic, erythematous rash. Improved with nystatin cream, which I Kidd Rx at discharge _____________  Discharge Vitals Blood pressure (!) 145/63, pulse (!) 59, temperature 98.3 F (36.8 C), resp. rate (!) 21, height 5' (1.524 m), weight 91.4 kg, SpO2 95 %.  Filed Weights   07/29/18 0816 07/30/18 0500 07/31/18 0500  Weight: 90.6 kg 92.4 kg 91.4 kg   VS:  BP (!) 145/63   Pulse (!) 59   Temp 98.3 F (36.8 C)   Resp (!) 21   Ht 5' (1.524 m)   Wt 91.4 kg   SpO2 95%   BMI 39.35 kg/m    GEN: Well nourished, well developed, in no acute distress HEENT: normal Neck: no JVD or masses Cardiac: RRR; 2/6 SEM @ RUSB. No  rubs, or gallops,no edema  Respiratory:  clear to auscultation bilaterally, normal work of breathing GI: soft, nontender, nondistended, + BS MS: no deformity or atrophy Skin: warm and dry, no rash. Groin sites healing well with no hematoma Neuro:  Alert and Oriented x 3, Strength and sensation are intact Psych: euthymic mood, full affect   Labs & Radiologic Studies    CBC Recent Labs    07/30/18 0320 07/31/18 0302  WBC 10.9* 9.4  HGB 10.3* 10.5*  HCT 35.8* 35.7*  MCV 93.7 92.2  PLT 194 841   Basic Metabolic Panel Recent Labs    07/30/18 0320 07/31/18 0302  NA 137 139  K 4.4 4.8  CL 106 106  CO2 26 29  GLUCOSE 133* 133*  BUN 23 17  CREATININE 1.23* 1.26*  CALCIUM 8.5* 8.7*  MG 2.0  --    Liver Function Tests No  results for input(s): AST, ALT, ALKPHOS, BILITOT, PROT, ALBUMIN in the last 72 hours. No results for input(s): LIPASE, AMYLASE in the last 72 hours. Cardiac Enzymes No results for input(s): CKTOTAL, CKMB, CKMBINDEX, TROPONINI in the last 72 hours. BNP Invalid input(s): POCBNP D-Dimer No results for input(s): DDIMER in the last 72 hours. Hemoglobin A1C No results for input(s): HGBA1C in the last 72 hours. Fasting Lipid Panel No results for input(s): CHOL, HDL, LDLCALC, TRIG, CHOLHDL, LDLDIRECT in the last 72 hours. Thyroid Function Tests No results for input(s): TSH, T4TOTAL, T3FREE, THYROIDAB in the last 72 hours.  Invalid input(s): FREET3 _____________  Dg Chest 2 View  Result Date: 07/25/2018 CLINICAL DATA:  Preop for TAVR.  Dry cough. EXAM: CHEST - 2 VIEW COMPARISON:  CT chest 06/25/2018. FINDINGS: Trachea is midline. Heart is enlarged. Lungs are clear. No pleural fluid. Flowing anterior osteophytosis in the thoracic spine. IMPRESSION: No acute findings. Electronically Signed   By: Lorin Picket M.D.   On: 07/25/2018 12:29   Dg Chest Port 1 View  Result Date: 07/29/2018 CLINICAL DATA:  Postop EXAM: PORTABLE CHEST 1 VIEW COMPARISON:  07/25/2018 FINDINGS: Right IJ central venous catheter tip over the SVC. Interval  aortic valve replacement. Mild cardiomegaly with aortic atherosclerosis. No pneumothorax, pleural effusion or focal opacity IMPRESSION: 1. Placement of right IJ central venous catheter with tip over the SVC. No pneumothorax. Interval valve replacement 2. Cardiomegaly without edema. Electronically Signed   By: Donavan Foil M.D.   On: 07/29/2018 17:53   Disposition   Pt is being discharged home today in good condition.  Follow-up Plans & Appointments    Follow-up Information    Eileen Stanford, PA-C. Go on 08/07/2018.   Specialties:  Cardiology, Radiology Why:  @ 2:30pm, please arrive 10 minutes early.  Contact information: [address] [place]  [place] [address] [phone]            Discharge Medications   Allergies as of 07/31/2018      Reactions   Actos [pioglitazone] Swelling      Medication List    TAKE these medications   albuterol 108 (90 Base) MCG/ACT inhaler Commonly known as:  PROVENTIL HFA;VENTOLIN HFA Inhale 2 puffs into the lungs every 6 (six) hours as needed for wheezing or shortness of breath.   amLODipine 5 MG tablet Commonly known as:  NORVASC Take 1 tablet (5 mg total) by mouth daily.   aspirin 81 MG tablet Take 81 mg by mouth daily.   clopidogrel 75 MG tablet Commonly known as:  PLAVIX Take 1 tablet (75 mg total) by mouth daily with breakfast. Start taking on:  08/01/2018   glipiZIDE 2.5 MG 24 hr tablet Commonly known as:  GLUCOTROL XL TAKE 1 TABLET BY MOUTH ONCE DAILY WITH BREAKFAST What changed:  See the new instructions.   metFORMIN 500 MG 24 hr tablet Commonly known as:  GLUCOPHAGE-XR TAKE 2 TABLETS BY MOUTH ONCE DAILY WITH SUPPER. What changed:  See the new instructions.   nystatin cream Commonly known as:  MYCOSTATIN Apply topically 2 (two) times daily.   Swartz LANCETS 35T Misc USE TO CHECK BLOOD SUGAR ONCE DAILY Dx code E11.9   ONETOUCH VERIO IQ SYSTEM w/Device Kit USE TO CHECK BLOOD SUGAR ONCE DAILY Dx code E11.9   ONETOUCH VERIO test strip Generic drug:  glucose blood USE 1 STRIP TO CHECK GLUCOSE ONCE DAILY What changed:  See the new instructions.   pramipexole 0.25 MG tablet Commonly known as:  MIRAPEX Take 0.25 mg by mouth at bedtime.   rosuvastatin 20 MG tablet Commonly known as:  CRESTOR Take 1 tablet (20 mg total) by mouth daily.   sertraline 25 MG tablet Commonly known as:  ZOLOFT Take 25 mg by mouth at bedtime.   vitamin B-12 1000 MCG tablet Commonly known as:  CYANOCOBALAMIN Take 1,000 mcg by mouth daily.   vitamin C 1000 MG tablet Take 1,000 mg by mouth daily.   Vitamin D 2000 units tablet Take 2,000 Units by mouth daily.         Outstanding Labs/Studies   none  Duration of Discharge Encounter   Greater than 30 minutes including physician time.  Mable Fill, PA-C 07/31/2018, 10:42 AM [phone]  Patient seen, examined. Available data reviewed. Agree with findings, assessment, and plan as outlined by Nell Range, PA-C.  On my exam today the patient is alert, oriented, in no distress.  Lung fields are clear, heart is regular rate and rhythm with a soft systolic ejection murmur at the right upper sternal border, bilateral groin sites are clear, abdomen is soft, obese, nontender.  Extremities show no edema.  The patient's postoperative day #1 echo is reviewed  and demonstrates normal LV systolic function as well as normal transcatheter heart valve function.  The patient is doing very well after TAVR.  Her heart rhythm is stable and normal sinus and her echo shows normal valve function.  She is ready for discharge home today with follow-up arranged as outlined above.  Sherren Mocha, M.D. 07/31/2018 11:30 AM

## 2018-07-31 NOTE — Plan of Care (Signed)
Discharge instructions reviewed. Discharged to home with daughter

## 2018-07-31 NOTE — Progress Notes (Signed)
CARDIAC REHAB PHASE I   PRE:  Rate/Rhythm: 58 SB   BP:  Supine:   Sitting: 147/55  Standing:    SaO2: 98%RA  MODE:  Ambulation: 300 ft   POST:  Rate/Rhythm: 77 SR  BP:  Supine:   Sitting: 133/50  Standing:    SaO2: 96%RA 1020-1050 Pt walked 300 ft on RA with rolling walker with steady gait. Tolerated well. To bathroom and then to recliner after walk. Gave diabetic diet and modified walking instructions. Discussed CRP 2 but pt not interested due to distance and cost of gas. Has gym where she is staying. Knows to stay off equipment until cleared by cardiology.   Luetta Nutting, RN BSN  07/31/2018 10:44 AM

## 2018-07-31 NOTE — Plan of Care (Signed)

## 2018-07-31 NOTE — Plan of Care (Signed)
Patient states treatment for nasal staph completed Tuesday morning 10/22

## 2018-08-01 ENCOUNTER — Encounter: Payer: Self-pay | Admitting: Thoracic Surgery (Cardiothoracic Vascular Surgery)

## 2018-08-01 ENCOUNTER — Telehealth: Payer: Self-pay | Admitting: Physician Assistant

## 2018-08-01 NOTE — Anesthesia Postprocedure Evaluation (Signed)
Anesthesia Post Note  Patient: Shannon Kidd  Procedure(s) Performed: TRANSCATHETER AORTIC VALVE REPLACEMENT, TRANSFEMORAL (N/A Chest) INTRAOPERATIVE TRANSTHORACIC ECHOCARDIOGRAM (Chest)     Patient location during evaluation: PACU Anesthesia Type: MAC Level of consciousness: awake and alert Pain management: pain level controlled Vital Signs Assessment: post-procedure vital signs reviewed and stable Respiratory status: spontaneous breathing, nonlabored ventilation, respiratory function stable and patient connected to nasal cannula oxygen Cardiovascular status: stable and blood pressure returned to baseline Postop Assessment: no apparent nausea or vomiting Anesthetic complications: no    Last Vitals:  Vitals:   07/31/18 1000 07/31/18 1100  BP: (!) 147/55 (!) 103/46  Pulse: (!) 56 (!) 48  Resp:    Temp:  36.7 C  SpO2: 91% 93%    Last Pain:  Vitals:   07/31/18 1100  TempSrc: Oral  PainSc:                  [place]

## 2018-08-01 NOTE — Telephone Encounter (Signed)
  [facility]   MULTIDISCIPLINARY HEART VALVE TEAM   Patient contacted regarding discharge from [facility] on 07/31/18  Patient understands to follow up with provider Carlean Jews on 10/31 @ 2:30pm at [address] [facility].  Patient understands discharge instructions? yes Patient understands medications and regiment? yes Patient understands to bring all medications to this visit? yes  Cline Crock PA-C  [facility]

## 2018-08-06 NOTE — Progress Notes (Signed)
[facility] [place]                                       Cardiology Office Note    Date:  08/07/2018   ID:  Shannon Kidd, DOB [birth year], MRN [id number]  PCP:  Leighton Ruff, MD  Cardiologist: Dr. Stanford Breed / Dr. Burt Knack & Dr. Roxy Manns (TAVR)  CC: [facility] s/p TAVR   History of Present Illness:  Shannon Kidd is a 72 y.o. female with a history of morbid obesity, HTN, HLD, DMT2, CKD stage II and severe AS s/p TAVR (07/29/18) who presents to clinic for follow up.   Patient states that she had Bright disease at age 69 and rheumatic fever at age 48. She was noted to have a heart murmur during childhood but states that the heart murmur eventually resolved. As she grew older her heart murmur became more pronounced and she was noted to have aortic stenosis. For the last several years she has been followed by Dr. Stanford Breed with serial echocardiograms that have demonstrated normal left ventricular systolic function with aortic stenosis that has progressed in severity. Beginning March 2019, the patient began to experience progressive symptoms of exertional shortness of breath and fatigue. Recent transthoracic echocardiogram documented the presence of severe aortic stenosis with peak velocity across the aortic valve measured 4.3 m/s corresponding to mean transvalvular gradient estimated 45 mmHg.The patient was referred to the multidisciplinary heart valve clinic and underwent left and right heart catheterization by Dr. Burt Knack which revealed mild nonobstructive coronary artery disease and confirmed the presence of severe aortic stenosis with mean transvalvular gradient measured 43 mmHg.  She underwent successful TAVR with a68m Edwards Sapien 3 THV via the TF approach on 07/29/18. Post operative echo showed EF 60%, normally functioning TAVR with mean gradient of 14 mm Hg and no PVL.   Today she presents to clinic for follow up. She has been walking 6 minutes a  day and she can tell a big difference in her breathing and energy levels. Daughter pushing her to move more. No CP or SOB. No LE edema, orthopnea or PND. She has had some mild dizziness with positional changes but no syncope. No blood in stool or urine. No palpitations.    Past Medical History:  Diagnosis Date  . Anxiety   . Cervical myelopathy (HCC)    s/p cervical fusion @ [facility]  . Chronic diastolic congestive heart failure ([facility]   . CKD (chronic kidney disease), stage III ([facility]   . Diabetes mellitus ([facility]   . Glaucoma   . HTN (hypertension)   . Hyperlipemia   . Morbid obesity ([facility]   . Rheumatic fever   . S/P TAVR (transcatheter aortic valve replacement) 07/29/2018   26 mm Edwards Sapien 3 transcatheter heart valve placed via percutaneous right transfemoral approach   . Severe aortic stenosis     Past Surgical History:  Procedure Laterality Date  . APPENDECTOMY    . CARDIAC CATHETERIZATION    . EYE SURGERY Bilateral    cataract extraction bilateral with lens  . INTRAOPERATIVE TRANSTHORACIC ECHOCARDIOGRAM  07/29/2018   Procedure: INTRAOPERATIVE TRANSTHORACIC ECHOCARDIOGRAM;  Surgeon: CSherren Mocha MD;  Location: [facility]  Service: Open Heart Surgery;;  . NECK SURGERY    . RIGHT/LEFT HEART CATH AND CORONARY ANGIOGRAPHY N/A 06/04/2018   Procedure: RIGHT/LEFT HEART CATH AND CORONARY ANGIOGRAPHY;  Surgeon: Sherren Mocha, MD;  Location: [facility];  Service: Cardiovascular;  Laterality: N/A;  . TONSILLECTOMY    . TRANSCATHETER AORTIC VALVE REPLACEMENT, TRANSFEMORAL N/A 07/29/2018   Procedure: TRANSCATHETER AORTIC VALVE REPLACEMENT, TRANSFEMORAL;  Surgeon: Sherren Mocha, MD;  Location: [facility];  Service: Open Heart Surgery;  Laterality: N/A;  . TUBAL LIGATION    . VAGINAL HYSTERECTOMY      Current Medications: Outpatient Medications Prior to Visit  Medication Sig Dispense Refill  . albuterol (PROVENTIL HFA;VENTOLIN HFA) 108 (90 Base) MCG/ACT inhaler Inhale 2  puffs into the lungs every 6 (six) hours as needed for wheezing or shortness of breath.    Marland Kitchen amLODipine (NORVASC) 5 MG tablet Take 1 tablet (5 mg total) by mouth daily. 90 tablet 3  . Ascorbic Acid (VITAMIN C) 1000 MG tablet Take 1,000 mg by mouth daily.    Marland Kitchen aspirin 81 MG tablet Take 81 mg by mouth daily.    . Blood Glucose Monitoring Suppl (ONETOUCH VERIO IQ SYSTEM) w/Device KIT USE TO CHECK BLOOD SUGAR ONCE DAILY Dx code E11.9 1 kit 2  . Cholecalciferol (VITAMIN D) 2000 UNITS tablet Take 2,000 Units by mouth daily.    . clopidogrel (PLAVIX) 75 MG tablet Take 1 tablet (75 mg total) by mouth daily with breakfast. 90 tablet 1  . glipiZIDE (GLUCOTROL XL) 2.5 MG 24 hr tablet TAKE 1 TABLET BY MOUTH ONCE DAILY WITH BREAKFAST (Patient taking differently: Take 2.5 mg by mouth daily with breakfast. ) 90 tablet 0  . metFORMIN (GLUCOPHAGE-XR) 500 MG 24 hr tablet TAKE 2 TABLETS BY MOUTH ONCE DAILY WITH SUPPER. (Patient taking differently: Take 500 mg by mouth every evening. ) 60 tablet 3  . nystatin cream (MYCOSTATIN) Apply topically 2 (two) times daily. 30 g 12  . ONETOUCH DELICA LANCETS 53M MISC USE TO CHECK BLOOD SUGAR ONCE DAILY Dx code E11.9 100 each 2  . ONETOUCH VERIO test strip USE 1 STRIP TO CHECK GLUCOSE ONCE DAILY (Patient taking differently: 1 each by Other route daily. ) 50 each 4  . pramipexole (MIRAPEX) 0.25 MG tablet Take 0.25 mg by mouth at bedtime.     . rosuvastatin (CRESTOR) 20 MG tablet Take 1 tablet (20 mg total) by mouth daily. 90 tablet 3  . sertraline (ZOLOFT) 25 MG tablet Take 25 mg by mouth at bedtime.     . vitamin B-12 (CYANOCOBALAMIN) 1000 MCG tablet Take 1,000 mcg by mouth daily.     No facility-administered medications prior to visit.      Allergies:   Actos [pioglitazone]   Social History   Socioeconomic History  . Marital status: Married    Spouse name: Not on file  . Number of children: 2  . Years of education: Not on file  . Highest education level: Not on file    Occupational History  . Not on file  Social Needs  . Financial resource strain: Not on file  . Food insecurity:    Worry: Not on file    Inability: Not on file  . Transportation needs:    Medical: Not on file    Non-medical: Not on file  Tobacco Use  . Smoking status: Never Smoker  . Smokeless tobacco: Never Used  Substance and Sexual Activity  . Alcohol use: No    Alcohol/week: 0.0 standard drinks  . Drug use: Never  . Sexual activity: Not on file  Lifestyle  . Physical activity:    Days per week: Not on file  Minutes per session: Not on file  . Stress: Not on file  Relationships  . Social connections:    Talks on phone: Not on file    Gets together: Not on file    Attends religious service: Not on file    Active member of club or organization: Not on file    Attends meetings of clubs or organizations: Not on file    Relationship status: Not on file  Other Topics Concern  . Not on file  Social History Narrative  . Not on file     Family History:  The patient's family history includes Breast cancer (age of onset: 67) in her maternal grandmother; Cancer in her mother; Diabetes in her mother, paternal grandmother, and sister.      ROS:   Please see the history of present illness.    ROS All other systems reviewed and are negative.   PHYSICAL EXAM:   VS:  BP (!) 144/72   Pulse 63   Ht 5' (1.524 m)   Wt 199 lb 12.8 oz (90.6 kg)   SpO2 98%   BMI 39.02 kg/m    GEN: Well nourished, well developed, in no acute distress, morbidly obese  HEENT: normal Neck: no JVD or masses Cardiac: RRR; soft SEM. no rubs, or gallops,no edema  Respiratory:  clear to auscultation bilaterally, normal work of breathing GI: soft, nontender, nondistended, + BS MS: no deformity or atrophy Skin: warm and dry, no rash. Groin sites healing well.  Neuro:  Alert and Oriented x 3, Strength and sensation are intact Psych: euthymic mood, full affect    Wt Readings from Last 3 Encounters:   08/07/18 199 lb 12.8 oz (90.6 kg)  07/31/18 201 lb 8 oz (91.4 kg)  07/25/18 199 lb 12.8 oz (90.6 kg)      Studies/Labs Reviewed:   EKG:  EKG is ordered today.  The ekg ordered today demonstrates NSR, LBBB HR 63 bpm  Recent Labs: 07/25/2018: ALT 16; B Natriuretic Peptide 69.6 07/30/2018: Magnesium 2.0 07/31/2018: BUN 17; Creatinine, Ser 1.26; Hemoglobin 10.5; Platelets 205; Potassium 4.8; Sodium 139   Lipid Panel    Component Value Date/Time   CHOL 78 05/28/2017 0846   TRIG 109.0 05/28/2017 0846   HDL 32.30 (L) 05/28/2017 0846   CHOLHDL 2 05/28/2017 0846   VLDL 21.8 05/28/2017 0846   LDLCALC 24 05/28/2017 0846    Additional studies/ records that were reviewed today include:  TAVR OPERATIVE NOTE   Date of Procedure:07/29/2018  Preoperative Diagnosis:Severe Aortic Stenosis  Procedure:   Transcatheter Aortic Valve Replacement - PercutaneousRightTransfemoral Approach Edwards Sapien 3 THV (size 42m, model # 9600TFX, serial # [id number]  Co-Surgeons:Clarence H. ORoxy Manns MD and MSherren Mocha MD  Pre-operative Echo Findings: ? Severe aortic stenosis ? Normalleft ventricular systolic function  Post-operative Echo Findings: ? Noparavalvular leak ? Unchangedleft ventricular systolic function  ___________________  Post operative echo 07/30/18 Study Conclusions - Left ventricle: The cavity size was normal. Wall thickness was normal. Systolic function was normal. The estimated ejection fraction was in the range of 60% to 65%. Wall motion was normal; there were no regional wall motion abnormalities. - Aortic valve: A bioprosthesis was present. Valve area (VTI): 1.35 cm^2. Valve area (Vmean): 1.14 cm^2. - Mitral valve: Mildly calcified annulus. Mildly thickened leaflets     ASSESSMENT & PLAN:   Severe AS s/p TAVR:doing great. She can really tell a difference in her breathing. Groins  sites healing well. ECG with new LBBB but no HAVB. Continue  ASA and plavix. SBE prophylaxis discussed; she has dentures and does not go to the dentist. I will see her back in 1 month for follow up and echo.   HTN: BP mildly elevated today. She says she has a hx of white coat HTN. No changes made  CKD stage II: creat baseline 1.1- 1.26  Morbid obesity:Body mass index is 39.02 kg/m.  Working on diet and exercise   Incidental finding of possible ovarian cyst: 4.6 x 2.7 x 3.7 cm low to intermediate attenuation lesion in the left adnexal region, presumably an ovarian cyst. Further evaluation with nonemergent pelvic ultrasound is recommended in the near future to better evaluate this finding. I will have this set up.   Medication Adjustments/Labs and Tests Ordered: Current medicines are reviewed at length with the patient today.  Concerns regarding medicines are outlined above.  Medication changes, Labs and Tests ordered today are listed in the Patient Instructions below. Patient Instructions  Medication Instructions:  Your physician recommends that you continue on your current medications as directed. Please refer to the Current Medication list given to you today.  If you need a refill on your cardiac medications before your next appointment, please call your pharmacy.   Lab work: none If you have labs (blood work) drawn today and your tests are completely normal, you will receive your results only by: Marland Kitchen MyChart Message (if you have MyChart) OR . A paper copy in the mail If you have any lab test that is abnormal or we need to change your treatment, we will call you to review the results.  Testing/Procedures: Your physician has requested that you have an echocardiogram. Echocardiography is a painless test that uses sound waves to create images of your heart. It provides your doctor with information about the size and shape of your heart and how well your heart's chambers and valves are  working. This procedure takes approximately one hour. There are no restrictions for this procedure.  Scheduled for December 4,2019  Your provider has recommended you have a pelvic ultrasound.  This will be done at [facility]--[address]   Follow-Up:  Follow up with K. Grandville Silos, [place] as planned on December 4,2019  Any Other Special Instructions Will Be Listed Below (If Applicable).        Signed, Angelena Form, PA-C  08/07/2018 3:32 PM    [facility] [place], [place], [place]  [address] Phone: [phone]; Fax: [phone]

## 2018-08-07 ENCOUNTER — Ambulatory Visit: Payer: Medicare Other | Admitting: Physician Assistant

## 2018-08-07 ENCOUNTER — Encounter: Payer: Self-pay | Admitting: Physician Assistant

## 2018-08-07 VITALS — BP 144/72 | HR 63 | Ht 60.0 in | Wt 199.8 lb

## 2018-08-07 DIAGNOSIS — I10 Essential (primary) hypertension: Secondary | ICD-10-CM

## 2018-08-07 DIAGNOSIS — Z952 Presence of prosthetic heart valve: Secondary | ICD-10-CM

## 2018-08-07 DIAGNOSIS — N83202 Unspecified ovarian cyst, left side: Secondary | ICD-10-CM | POA: Diagnosis not present

## 2018-08-07 DIAGNOSIS — N189 Chronic kidney disease, unspecified: Secondary | ICD-10-CM | POA: Diagnosis not present

## 2018-08-07 NOTE — Patient Instructions (Signed)
Medication Instructions:  Your physician recommends that you continue on your current medications as directed. Please refer to the Current Medication list given to you today.  If you need a refill on your cardiac medications before your next appointment, please call your pharmacy.   Lab work: none If you have labs (blood work) drawn today and your tests are completely normal, you will receive your results only by: Marland Kitchen MyChart Message (if you have MyChart) OR . A paper copy in the mail If you have any lab test that is abnormal or we need to change your treatment, we will call you to review the results.  Testing/Procedures: Your physician has requested that you have an echocardiogram. Echocardiography is a painless test that uses sound waves to create images of your heart. It provides your doctor with information about the size and shape of your heart and how well your heart's chambers and valves are working. This procedure takes approximately one hour. There are no restrictions for this procedure.  Scheduled for December 4,2019  Your provider has recommended you have a pelvic ultrasound.  This will be done at [facility]--[address]   Follow-Up:  Follow up with K. Grandville Silos, [place] as planned on December 4,2019  Any Other Special Instructions Will Be Listed Below (If Applicable).

## 2018-08-07 NOTE — Addendum Note (Signed)
Addended by: Thompson Grayer on: 08/07/2018 04:03 PM   Modules accepted: Orders

## 2018-08-12 ENCOUNTER — Ambulatory Visit
Admission: RE | Admit: 2018-08-12 | Discharge: 2018-08-12 | Disposition: A | Discharge status: Home / Self Care | Payer: Medicare Other | Source: Ambulatory Visit | Attending: Physician Assistant | Admitting: Physician Assistant

## 2018-08-12 DIAGNOSIS — N83202 Unspecified ovarian cyst, left side: Secondary | ICD-10-CM

## 2018-08-14 ENCOUNTER — Other Ambulatory Visit (INDEPENDENT_AMBULATORY_CARE_PROVIDER_SITE_OTHER): Payer: Medicare Other

## 2018-08-14 ENCOUNTER — Other Ambulatory Visit: Payer: Medicare Other

## 2018-08-14 DIAGNOSIS — E119 Type 2 diabetes mellitus without complications: Secondary | ICD-10-CM

## 2018-08-14 LAB — COMPREHENSIVE METABOLIC PANEL WITH GFR
ALT: 13 U/L (ref 0–35)
AST: 22 U/L (ref 0–37)
Albumin: 4.1 g/dL (ref 3.5–5.2)
Alkaline Phosphatase: 76 U/L (ref 39–117)
BUN: 24 mg/dL — ABNORMAL HIGH (ref 6–23)
CO2: 30 meq/L (ref 19–32)
Calcium: 9.7 mg/dL (ref 8.4–10.5)
Chloride: 102 meq/L (ref 96–112)
Creatinine, Ser: 1.31 mg/dL — ABNORMAL HIGH (ref 0.40–1.20)
GFR: 42.39 mL/min — ABNORMAL LOW
Glucose, Bld: 160 mg/dL — ABNORMAL HIGH (ref 70–99)
Potassium: 4.7 meq/L (ref 3.5–5.1)
Sodium: 139 meq/L (ref 135–145)
Total Bilirubin: 0.4 mg/dL (ref 0.2–1.2)
Total Protein: 7.3 g/dL (ref 6.0–8.3)

## 2018-08-14 LAB — HEMOGLOBIN A1C: Hgb A1c MFr Bld: 7.3 % — ABNORMAL HIGH (ref 4.6–6.5)

## 2018-08-14 LAB — MICROALBUMIN / CREATININE URINE RATIO
Creatinine,U: 47.8 mg/dL
Microalb Creat Ratio: 48.8 mg/g — ABNORMAL HIGH (ref 0.0–30.0)
Microalb, Ur: 23.3 mg/dL — ABNORMAL HIGH (ref 0.0–1.9)

## 2018-08-19 ENCOUNTER — Ambulatory Visit: Payer: Medicare Other | Admitting: Endocrinology

## 2018-08-19 ENCOUNTER — Encounter: Payer: Self-pay | Admitting: Endocrinology

## 2018-08-19 VITALS — BP 140/72 | HR 56 | Ht 60.0 in | Wt 197.0 lb

## 2018-08-19 DIAGNOSIS — E1165 Type 2 diabetes mellitus with hyperglycemia: Secondary | ICD-10-CM

## 2018-08-19 MED ORDER — GLIMEPIRIDE 1 MG PO TABS: 1.0000 mg | ORAL_TABLET | Freq: Every day | ORAL | 2 refills | Status: DC

## 2018-08-19 NOTE — Patient Instructions (Signed)
Glimeperide '1mg'$  at BEDTIME and stop glipizide  Call if not better

## 2018-08-19 NOTE — Progress Notes (Signed)
Patient ID: Shannon Kidd, female   DOB: 11-15-1945, 72 y.o.   MRN: [id number]           Reason for Appointment: Followup for Type 2 Diabetes  Referring physician: Leighton Ruff  History of Present Illness:          Diagnosis: Type 2 diabetes mellitus, date of diagnosis: 2000        Past history: She used Metformin for her diabetes for several years until early 2015 and was taking 1g bid before it was stopped. She thinks her blood sugars are well controlled with this. Also at some point she was given Actos but this was stopped because of edema. Metformin was stopped in early 2015 on the suggestion of a nephrologist because of her renal dysfunction Since early 2015 she had been taking Januvia 50 mg daily When her blood sugars were not well controlled with A1c of 8% in 2015 she was started on low dose metformin to control fasting hyperglycemia  Recent history:   Oral hypoglycemic drugs the patient is taking are: Glipizide ER 2. 5 mg with 1000 mg metformin ER at bedtime    A1c is now 7.3  Current management, blood sugar patterns and problems identified:  She has had higher fasting blood sugars and also periodically high nonfasting readings and she thinks this started right before her noninvasive aortic valve replacement  Because of this issue she had been off metformin for 5 days  Also has been much less active  Highest fasting blood sugar is 182 occasionally below 140  However more recently her evening readings have been near normal with only one high reading on Sunday  She takes both her glipizide and metformin in the evening; has difficulty tolerating regular metformin because of diarrhea Her weight is about the same  Hypoglycemia: None          Side effects from medications have been: None Compliance with the medical regimen: Recently better  Glucose monitoring:  done 1 or less times a day         Glucometer: Verio      Blood Glucose readings by time of day and  averages from meter   PRE-MEAL Fasting Lunch Dinner Bedtime Overall  Glucose range:  135-182      Mean/median: 155  161  117  144   POST-MEAL PC Breakfast PC Lunch PC Dinner  Glucose range:  107-165   81-174  Mean/median:    130    Self-care:      Meals: 3 meals per day. Is eating out less, Breakfast is egg, toast.      Exercise: walking less      Dietician visit: Most recent: 8/15.               Weight history: 195-245 Pounds previously  Wt Readings from Last 3 Encounters:  08/19/18 197 lb (89.4 kg)  08/07/18 199 lb 12.8 oz (90.6 kg)  07/31/18 201 lb 8 oz (91.4 kg)    Lab Results  Component Value Date   HGBA1C 7.3 (H) 08/14/2018   HGBA1C 7.3 (H) 07/25/2018   HGBA1C 7.1 (H) 04/09/2018   Lab Results  Component Value Date   MICROALBUR 23.3 (H) 08/14/2018   LDLCALC 24 05/28/2017   CREATININE 1.31 (H) 08/14/2018    Allergies as of 08/19/2018      Reactions   Actos [pioglitazone] Swelling      Medication List        Accurate as of 08/19/18  8:53 AM. Always use your most recent med list.          albuterol 108 (90 Base) MCG/ACT inhaler Commonly known as:  PROVENTIL HFA;VENTOLIN HFA Inhale 2 puffs into the lungs every 6 (six) hours as needed for wheezing or shortness of breath.   amLODipine 5 MG tablet Commonly known as:  NORVASC Take 1 tablet (5 mg total) by mouth daily.   aspirin 81 MG tablet Take 81 mg by mouth daily.   clopidogrel 75 MG tablet Commonly known as:  PLAVIX Take 1 tablet (75 mg total) by mouth daily with breakfast.   glipiZIDE 2.5 MG 24 hr tablet Commonly known as:  GLUCOTROL XL TAKE 1 TABLET BY MOUTH ONCE DAILY WITH BREAKFAST   metFORMIN 500 MG 24 hr tablet Commonly known as:  GLUCOPHAGE-XR TAKE 2 TABLETS BY MOUTH ONCE DAILY WITH SUPPER.   nystatin cream Commonly known as:  MYCOSTATIN Apply topically 2 (two) times daily.   [place] LANCETS 95J Misc USE TO CHECK BLOOD SUGAR ONCE DAILY Dx code E11.9   ONETOUCH VERIO IQ  SYSTEM w/Device Kit USE TO CHECK BLOOD SUGAR ONCE DAILY Dx code E11.9   ONETOUCH VERIO test strip Generic drug:  glucose blood USE 1 STRIP TO CHECK GLUCOSE ONCE DAILY   pramipexole 0.25 MG tablet Commonly known as:  MIRAPEX Take 0.25 mg by mouth at bedtime.   rosuvastatin 20 MG tablet Commonly known as:  CRESTOR Take 1 tablet (20 mg total) by mouth daily.   sertraline 25 MG tablet Commonly known as:  ZOLOFT Take 25 mg by mouth at bedtime.   vitamin B-12 1000 MCG tablet Commonly known as:  CYANOCOBALAMIN Take 1,000 mcg by mouth daily.   vitamin C 1000 MG tablet Take 1,000 mg by mouth daily.   Vitamin D 50 MCG (2000 UT) tablet Take 2,000 Units by mouth daily.       Allergies:  Allergies  Allergen Reactions  . Actos [Pioglitazone] Swelling    Past Medical History:  Diagnosis Date  . Anxiety   . Cervical myelopathy (HCC)    s/p cervical fusion @ [facility]  . Chronic diastolic congestive heart failure ([place])   . CKD (chronic kidney disease), stage III ([place])   . Diabetes mellitus ([place])   . Glaucoma   . HTN (hypertension)   . Hyperlipemia   . Morbid obesity ([place])   . Rheumatic fever   . S/P TAVR (transcatheter aortic valve replacement) 07/29/2018   26 mm Edwards Sapien 3 transcatheter heart valve placed via percutaneous right transfemoral approach   . Severe aortic stenosis     Past Surgical History:  Procedure Laterality Date  . APPENDECTOMY    . CARDIAC CATHETERIZATION    . EYE SURGERY Bilateral    cataract extraction bilateral with lens  . INTRAOPERATIVE TRANSTHORACIC ECHOCARDIOGRAM  07/29/2018   Procedure: INTRAOPERATIVE TRANSTHORACIC ECHOCARDIOGRAM;  Surgeon: Sherren Mocha, MD;  Location: [facility];  Service: Open Heart Surgery;;  . NECK SURGERY    . RIGHT/LEFT HEART CATH AND CORONARY ANGIOGRAPHY N/A 06/04/2018   Procedure: RIGHT/LEFT HEART CATH AND CORONARY ANGIOGRAPHY;  Surgeon: Sherren Mocha, MD;  Location: [facility];  Service:  Cardiovascular;  Laterality: N/A;  . TONSILLECTOMY    . TRANSCATHETER AORTIC VALVE REPLACEMENT, TRANSFEMORAL N/A 07/29/2018   Procedure: TRANSCATHETER AORTIC VALVE REPLACEMENT, TRANSFEMORAL;  Surgeon: Sherren Mocha, MD;  Location: [facility];  Service: Open Heart Surgery;  Laterality: N/A;  . TUBAL LIGATION    . VAGINAL HYSTERECTOMY  Family History  Problem Relation Age of Onset  . Diabetes Mother   . Cancer Mother        Colon  . Diabetes Sister   . Diabetes Paternal Grandmother   . Breast cancer Maternal Grandmother 46    Social History:  reports that she has never smoked. She has never used smokeless tobacco. She reports that she does not drink alcohol or use drugs.    Review of Systems       Lipids: She has been treated with Crestor 20 mg by cardiologist HDL tends to be low Last LDL done by PCP was 22       Lab Results  Component Value Date   CHOL 78 05/28/2017   HDL 32.30 (L) 05/28/2017   LDLCALC 24 05/28/2017   TRIG 109.0 05/28/2017   CHOLHDL 2 05/28/2017                  The blood pressure has been controlled with Norvasc   BP Readings from Last 3 Encounters:  08/19/18 140/72  08/07/18 (!) 144/72  07/31/18 (!) 103/46        Diabetic foot exam done in 12/2017 with normal findings   RENAL dysfunction: Although her creatinine had gone up to 1.53 on  a previous visit it is now more consistently normal  Does not see nephrologist currently  Lab Results  Component Value Date   CREATININE 1.31 (H) 08/14/2018   CREATININE 1.26 (H) 07/31/2018   CREATININE 1.23 (H) 07/30/2018    She is on vitamin D supplements followed by PCP   LABS:  Lab on 08/14/2018  Component Date Value Ref Range Status  . Microalb, Ur 08/14/2018 23.3* 0.0 - 1.9 mg/dL Final  . Creatinine,U 08/14/2018 47.8  mg/dL Final  . Microalb Creat Ratio 08/14/2018 48.8* 0.0 - 30.0 mg/g Final  . Sodium 08/14/2018 139  135 - 145 mEq/L Final  . Potassium 08/14/2018 4.7  3.5 - 5.1 mEq/L Final  .  Chloride 08/14/2018 102  96 - 112 mEq/L Final  . CO2 08/14/2018 30  19 - 32 mEq/L Final  . Glucose, Bld 08/14/2018 160* 70 - 99 mg/dL Final  . BUN 08/14/2018 24* 6 - 23 mg/dL Final  . Creatinine, Ser 08/14/2018 1.31* 0.40 - 1.20 mg/dL Final  . Total Bilirubin 08/14/2018 0.4  0.2 - 1.2 mg/dL Final  . Alkaline Phosphatase 08/14/2018 76  39 - 117 U/L Final  . AST 08/14/2018 22  0 - 37 U/L Final  . ALT 08/14/2018 13  0 - 35 U/L Final  . Total Protein 08/14/2018 7.3  6.0 - 8.3 g/dL Final  . Albumin 08/14/2018 4.1  3.5 - 5.2 g/dL Final  . Calcium 08/14/2018 9.7  8.4 - 10.5 mg/dL Final  . GFR 08/14/2018 42.39* >60.00 mL/min Final  . Hgb A1c MFr Bld 08/14/2018 7.3* 4.6 - 6.5 % Final   Glycemic Control Guidelines for People with Diabetes:Non Diabetic:  <6%Goal of Therapy: <7%Additional Action Suggested:  >8%     Physical Examination:  BP 140/72   Pulse (!) 56   Ht 5' (1.524 m)   Wt 197 lb (89.4 kg)   SpO2 96%   BMI 38.47 kg/m     ASSESSMENT:  Diabetes type 2 with obesity, BMI 38  See history of present illness for discussion of current diabetes management, blood sugar patterns and problems identified  Her A1c is slightly higher at 7.3, previously was at 7.1  She is on a regimen of 2.5  mg glipizide ER and 1000 mg of metformin ER  Appears to have had higher blood sugars because of her cardiac procedure and probably lack of physical activity as well as stress Also at some point did not get her metformin because of contrast injection Appears to have relatively high fasting readings persistently now  RENAL dysfunction: Has been borderline and stable, has minimal increase in urine microalbumin   HYPERTENSION: Blood pressure  is controlled on Norvasc 5 mg   PLAN:  Trial of Amaryl 1 mg at bedtime instead of glipizide She will continue metformin unchanged She will call if blood sugars are not improved or getting low She will try to increase her activity level since she is  subjectively doing better Follow-up in 3 months  There are no Patient Instructions on file for this visit.   Elayne Snare 08/19/2018, 8:53 AM   Note: This office note was prepared with Dragon voice recognition system technology. Any transcriptional errors that result from this process are unintentional.

## 2018-08-25 ENCOUNTER — Telehealth: Payer: Self-pay | Admitting: Endocrinology

## 2018-08-25 NOTE — Telephone Encounter (Signed)
Please call patient at ph# [phone]. Patient states that she started taking Glimperide on Friday 08/22/18 and is seeing double really bad.

## 2018-08-25 NOTE — Telephone Encounter (Signed)
Patient started glimepiride on wed night and by Friday night she started with double vision and got worse on sat please advise if this could be caused by this medication

## 2018-08-25 NOTE — Telephone Encounter (Signed)
LMTCB to discuss issue she is having with Glimepiride

## 2018-08-25 NOTE — Telephone Encounter (Signed)
As long as she is not having low blood sugars below 70 her double vision is unrelated and she needs to see her PCP or ophthalmologist as soon as possible

## 2018-08-25 NOTE — Telephone Encounter (Signed)
Gave patient advice from note below and she stated an understanding

## 2018-08-26 ENCOUNTER — Ambulatory Visit (HOSPITAL_COMMUNITY)
Admission: RE | Admit: 2018-08-26 | Discharge: 2018-08-26 | Disposition: A | Discharge status: Home / Self Care | Payer: Medicare Other | Source: Ambulatory Visit | Attending: Family Medicine | Admitting: Family Medicine

## 2018-08-26 ENCOUNTER — Other Ambulatory Visit (HOSPITAL_COMMUNITY): Payer: Self-pay | Admitting: Family Medicine

## 2018-08-26 DIAGNOSIS — H532 Diplopia: Secondary | ICD-10-CM | POA: Diagnosis not present

## 2018-08-26 MED ORDER — GADOBUTROL 1 MMOL/ML IV SOLN
9.0000 mL | Freq: Once | INTRAVENOUS | Status: AC | PRN
  Administered 2018-08-26: 9 mL via INTRAVENOUS

## 2018-09-03 ENCOUNTER — Ambulatory Visit: Payer: Medicare Other | Admitting: Physician Assistant

## 2018-09-03 ENCOUNTER — Other Ambulatory Visit (HOSPITAL_COMMUNITY): Payer: Medicare Other

## 2018-09-09 NOTE — Progress Notes (Signed)
[facility] [facility]                                       Cardiology Office Note    Date:  09/10/2018   ID:  Shannon Kidd, DOB 08-20-1946, MRN [id number]  PCP:  Leighton Ruff, MD  Cardiologist: Dr. Stanford Breed / Dr. Burt Knack & Dr. Roxy Manns (TAVR)  CC: 1 month s/p TAVR   History of Present Illness:  Shannon Kidd is a 72 y.o. female with a history of morbid obesity, HTN, HLD, DMT2, CKD stage II and severe AS s/p TAVR (07/29/18) who presents to clinic for follow up.   Patient states that she had Bright disease at age 61 and rheumatic fever at age 84. She was noted to have a heart murmur during childhood but states that the heart murmur eventually resolved. As she grew older her heart murmur became more pronounced and she was noted to have aortic stenosis. For the last several years she has been followed by Dr. Stanford Breed with serial echocardiograms that have demonstrated normal left ventricular systolic function with aortic stenosis that has progressed in severity. Beginning March 2019, the patient began to experience progressive symptoms of exertional shortness of breath and fatigue. Recent transthoracic echocardiogram documented the presence of severe aortic stenosis with peak velocity across the aortic valve measured 4.3 m/s corresponding to mean transvalvular gradient estimated 45 mmHg.The patient was referred to the multidisciplinary heart valve clinic and underwent left and right heart catheterization by Dr. Burt Knack which revealed mild nonobstructive coronary artery disease and confirmed the presence of severe aortic stenosis with mean transvalvular gradient measured 43 mmHg.  She underwent successful TAVR with a49m Edwards Sapien 3 THV via the TF approach on 07/29/18. Post operative echo showed EF 60%, normally functioning TAVR with mean gradient of 14 mm Hg and no PVL.   Today she presents to clinic for follow up. Since I saw her last she has  developed double vision and opthalmologist said he may have temporal arteritis vs paralyzed nerve in her eye that could be 2/2 to a stroke. She is having tests to follow up on this. No CP or SOB. No LE edema, orthopnea or PND. No dizziness or syncope. No blood in stool or urine. No palpitations. She can tell a big difference in how she feels since having her TAVR. Still get some shortness of breath with moderate activity but no issues with her daily activity. She had some palpitations of feeling her heart beat very hard. It happened a couple weeks ago with no recurrence.    Past Medical History:  Diagnosis Date  . Anxiety   . Cervical myelopathy (HCC)    s/p cervical fusion @ [facility]  . Chronic diastolic congestive heart failure ([place]   . CKD (chronic kidney disease), stage III ([place]   . Diabetes mellitus ([place]   . Glaucoma   . HTN (hypertension)   . Hyperlipemia   . Morbid obesity ([place]   . Rheumatic fever   . S/P TAVR (transcatheter aortic valve replacement) 07/29/2018   26 mm Edwards Sapien 3 transcatheter heart valve placed via percutaneous right transfemoral approach   . Severe aortic stenosis     Past Surgical History:  Procedure Laterality Date  . APPENDECTOMY    . CARDIAC CATHETERIZATION    . EYE SURGERY Bilateral    cataract extraction  bilateral with lens  . INTRAOPERATIVE TRANSTHORACIC ECHOCARDIOGRAM  07/29/2018   Procedure: INTRAOPERATIVE TRANSTHORACIC ECHOCARDIOGRAM;  Surgeon: Sherren Mocha, MD;  Location: [place];  Service: Open Heart Surgery;;  . NECK SURGERY    . RIGHT/LEFT HEART CATH AND CORONARY ANGIOGRAPHY N/A 06/04/2018   Procedure: RIGHT/LEFT HEART CATH AND CORONARY ANGIOGRAPHY;  Surgeon: Sherren Mocha, MD;  Location: [place] [facility];  Service: Cardiovascular;  Laterality: N/A;  . TONSILLECTOMY    . TRANSCATHETER AORTIC VALVE REPLACEMENT, TRANSFEMORAL N/A 07/29/2018   Procedure: TRANSCATHETER AORTIC VALVE REPLACEMENT, TRANSFEMORAL;  Surgeon: Sherren Mocha, MD;  Location: [place];  Service: Open Heart Surgery;  Laterality: N/A;  . TUBAL LIGATION    . VAGINAL HYSTERECTOMY      Current Medications: Outpatient Medications Prior to Visit  Medication Sig Dispense Refill  . albuterol (PROVENTIL HFA;VENTOLIN HFA) 108 (90 Base) MCG/ACT inhaler Inhale 2 puffs into the lungs every 6 (six) hours as needed for wheezing or shortness of breath.    . Ascorbic Acid (VITAMIN C) 1000 MG tablet Take 1,000 mg by mouth daily.    Marland Kitchen aspirin 81 MG tablet Take 81 mg by mouth daily.    . Blood Glucose Monitoring Suppl (ONETOUCH VERIO IQ SYSTEM) w/Device KIT USE TO CHECK BLOOD SUGAR ONCE DAILY Dx code E11.9 1 kit 2  . Cholecalciferol (VITAMIN D) 2000 UNITS tablet Take 2,000 Units by mouth daily.    . clopidogrel (PLAVIX) 75 MG tablet Take 1 tablet (75 mg total) by mouth daily with breakfast. 90 tablet 1  . glimepiride (AMARYL) 1 MG tablet Take 1 tablet (1 mg total) by mouth at bedtime. 30 tablet 2  . metFORMIN (GLUCOPHAGE-XR) 500 MG 24 hr tablet Take 1,000 mg by mouth daily with breakfast.    . nystatin cream (MYCOSTATIN) Apply topically 2 (two) times daily. 30 g 12  . ONETOUCH DELICA LANCETS 00F MISC USE TO CHECK BLOOD SUGAR ONCE DAILY Dx code E11.9 100 each 2  . ONETOUCH VERIO test strip USE 1 STRIP TO CHECK GLUCOSE ONCE DAILY (Patient taking differently: 1 each by Other route daily. ) 50 each 4  . pramipexole (MIRAPEX) 0.25 MG tablet Take 0.25 mg by mouth at bedtime.     . rosuvastatin (CRESTOR) 20 MG tablet Take 1 tablet (20 mg total) by mouth daily. 90 tablet 3  . sertraline (ZOLOFT) 25 MG tablet Take 25 mg by mouth at bedtime.     . vitamin B-12 (CYANOCOBALAMIN) 1000 MCG tablet Take 1,000 mcg by mouth daily.    Marland Kitchen amLODipine (NORVASC) 5 MG tablet Take 1 tablet (5 mg total) by mouth daily. 90 tablet 3  . metFORMIN (GLUCOPHAGE-XR) 500 MG 24 hr tablet TAKE 2 TABLETS BY MOUTH ONCE DAILY WITH SUPPER. (Patient not taking: No sig reported) 60 tablet 3   No  facility-administered medications prior to visit.      Allergies:   Actos [pioglitazone]   Social History   Socioeconomic History  . Marital status: Married    Spouse name: Not on file  . Number of children: 2  . Years of education: Not on file  . Highest education level: Not on file  Occupational History  . Not on file  Social Needs  . Financial resource strain: Not on file  . Food insecurity:    Worry: Not on file    Inability: Not on file  . Transportation needs:    Medical: Not on file    Non-medical: Not on file  Tobacco Use  .  Smoking status: Never Smoker  . Smokeless tobacco: Never Used  Substance and Sexual Activity  . Alcohol use: No    Alcohol/week: 0.0 standard drinks  . Drug use: Never  . Sexual activity: Not on file  Lifestyle  . Physical activity:    Days per week: Not on file    Minutes per session: Not on file  . Stress: Not on file  Relationships  . Social connections:    Talks on phone: Not on file    Gets together: Not on file    Attends religious service: Not on file    Active member of club or organization: Not on file    Attends meetings of clubs or organizations: Not on file    Relationship status: Not on file  Other Topics Concern  . Not on file  Social History Narrative  . Not on file     Family History:  The patient's family history includes Breast cancer (age of onset: 49) in her maternal grandmother; Cancer in her mother; Diabetes in her mother, paternal grandmother, and sister.      ROS:   Please see the history of present illness.    ROS All other systems reviewed and are negative.   PHYSICAL EXAM:   VS:  BP (!) 162/72   Pulse 65   Ht 5' (1.524 m)   Wt 199 lb 12.8 oz (90.6 kg)   SpO2 94%   BMI 39.02 kg/m    GEN: Well nourished, well developed, in no acute distress, obese HEENT: normal Neck: no JVD or masses Cardiac: RRR; 2/6 systolic murmur. No rubs, or gallops,no edema  Respiratory:  clear to auscultation  bilaterally, normal work of breathing GI: soft, nontender, nondistended, + BS MS: no deformity or atrophy Skin: warm and dry, no rash Neuro:  Alert and Oriented x 3, Strength and sensation are intact Psych: euthymic mood, full affect   Wt Readings from Last 3 Encounters:  09/10/18 199 lb 12.8 oz (90.6 kg)  08/19/18 197 lb (89.4 kg)  08/07/18 199 lb 12.8 oz (90.6 kg)      Studies/Labs Reviewed:   EKG:  EKG is NOT ordered today.   Recent Labs: 07/25/2018: B Natriuretic Peptide 69.6 07/30/2018: Magnesium 2.0 07/31/2018: Hemoglobin 10.5; Platelets 205 08/14/2018: ALT 13; BUN 24; Creatinine, Ser 1.31; Potassium 4.7; Sodium 139   Lipid Panel    Component Value Date/Time   CHOL 78 05/28/2017 0846   TRIG 109.0 05/28/2017 0846   HDL 32.30 (L) 05/28/2017 0846   CHOLHDL 2 05/28/2017 0846   VLDL 21.8 05/28/2017 0846   LDLCALC 24 05/28/2017 0846    Additional studies/ records that were reviewed today include:  TAVR OPERATIVE NOTE   Date of Procedure:07/29/2018  Preoperative Diagnosis:Severe Aortic Stenosis  Procedure:   Transcatheter Aortic Valve Replacement - PercutaneousRightTransfemoral Approach Edwards Sapien 3 THV (size 76m, model # 9600TFX, serial # [id number]  Co-Surgeons:Clarence H. ORoxy Manns MD and MSherren Mocha MD  Pre-operative Echo Findings: ? Severe aortic stenosis ? Normalleft ventricular systolic function  Post-operative Echo Findings: ? Noparavalvular leak ? Unchangedleft ventricular systolic function  ___________________  Post operative echo 07/30/18 Study Conclusions - Left ventricle: The cavity size was normal. Wall thickness was normal. Systolic function was normal. The estimated ejection fraction was in the range of 60% to 65%. Wall motion was normal; there were no regional wall motion abnormalities. - Aortic valve: A bioprosthesis was present. Valve area (VTI):  1.35 cm^2. Valve area (Vmean): 1.14 cm^2. - Mitral valve: Mildly  calcified annulus. Mildly thickened leaflets   ___________________  Echo 09/10/18 Study Conclusions - Left ventricle: The cavity size was normal. Systolic function was   normal. The estimated ejection fraction was in the range of 55%   to 60%. Wall motion was normal; there were no regional wall   motion abnormalities. Doppler parameters are consistent with   abnormal left ventricular relaxation (grade 1 diastolic   dysfunction). Doppler parameters are consistent with elevated   ventricular end-diastolic filling pressure. - Aortic valve: S/P TAVR with a 26 mm Edwards-SAPIEN 3 valve. Mean   gradient (S): 12 mm Hg. Peak gradient (S): 24 mm Hg. - Mitral valve: Calcified annulus. Mildly thickened leaflets .   There was mild regurgitation. - Left atrium: The atrium was mildly dilated. - Right ventricle: The cavity size was normal. Wall thickness was   normal. Systolic function was normal. - Right atrium: The atrium was normal in size. - Tricuspid valve: There was mild regurgitation. - Pulmonic valve: There was no regurgitation. - Pulmonary arteries: Systolic pressure was within the normal   range. - Inferior vena cava: The vessel was normal in size. - Pericardium, extracardiac: There was no pericardial effusion. Impressions: - There is no change from the prior study on 07/30/2018.   Transaortic gradients are unchanged and remain in the normal   range (peak/mean 20/12/mmHg).   ASSESSMENT & PLAN:   Severe AS s/p TAVR: echo today shows EF 55% with normally functioning TAVR with mean gradient 46m Hg and now PVL. She has NYHA class II symptoms. She overall can tell a huge difference in how she feels since having TAVR. SBE prophylaxis discussed; she has dentures and doesn't go to dentist. Plavix can be discontinued after 6 months of therapy (01/2019).  HTN: BP remains elevated. Will increase amlodipine from 574mdaily to  1039maily.   CKD stage II: creat baseline 1.1-1.26  Morbid obesity:Body mass index is 39.02 kg/m. Working on diet and exercise.   Incidental finding of ovarian cyst: pre TAVR CT scan showed 4.6 x 2.7 x 3.7 cm low to intermediate attenuation lesion in the left adnexal region, presumably an ovarian cyst. Follow up ultrasound confirmed 5 cm adnexal cyst. Follow up recommended in 6 months. She has been asked to follow with PCP or gynecologist on this. She is establishing care with Dr. OzaNelda Marseilleter this month. I have forwarded a copy of the tests.   Medication Adjustments/Labs and Tests Ordered: Current medicines are reviewed at length with the patient today.  Concerns regarding medicines are outlined above.  Medication changes, Labs and Tests ordered today are listed in the Patient Instructions below. Patient Instructions  Medication Instructions:   INCREASE YOUR AMLODIPINE TO 10 MG BY MOUTH DAILY  STOP TAKING PLAVIX ON January 28, 2019 PER KATY THOMPSON PA-C  If you need a refill on your cardiac medications before your next appointment, please call your pharmacy.       Follow-Up:  3 MONTHS WITH DR. CREWalnut Coveour physician wants you to follow-up in: [place] receive a reminder letter in the mail two months in advance. If you don't receive a letter, please call our office to schedule the follow-up appointment.       Signed, KatAngelena FormA-C  09/10/2018 5:0[facility] [address]  [id number]: [phone]: [phone]

## 2018-09-10 ENCOUNTER — Other Ambulatory Visit: Payer: Self-pay

## 2018-09-10 ENCOUNTER — Ambulatory Visit (HOSPITAL_COMMUNITY): Payer: Medicare Other | Attending: Cardiology

## 2018-09-10 ENCOUNTER — Ambulatory Visit: Payer: Medicare Other | Admitting: Physician Assistant

## 2018-09-10 ENCOUNTER — Telehealth: Payer: Self-pay | Admitting: *Deleted

## 2018-09-10 ENCOUNTER — Encounter: Payer: Self-pay | Admitting: Physician Assistant

## 2018-09-10 ENCOUNTER — Other Ambulatory Visit: Payer: Self-pay | Admitting: Physician Assistant

## 2018-09-10 VITALS — BP 162/72 | HR 65 | Ht 60.0 in | Wt 199.8 lb

## 2018-09-10 DIAGNOSIS — N83202 Unspecified ovarian cyst, left side: Secondary | ICD-10-CM

## 2018-09-10 DIAGNOSIS — Z952 Presence of prosthetic heart valve: Secondary | ICD-10-CM

## 2018-09-10 DIAGNOSIS — N189 Chronic kidney disease, unspecified: Secondary | ICD-10-CM

## 2018-09-10 DIAGNOSIS — I10 Essential (primary) hypertension: Secondary | ICD-10-CM

## 2018-09-10 MED ORDER — AMLODIPINE BESYLATE 10 MG PO TABS: 10.0000 mg | ORAL_TABLET | Freq: Every day | ORAL | 3 refills | Status: DC

## 2018-09-10 NOTE — Patient Instructions (Signed)
Medication Instructions:   INCREASE YOUR AMLODIPINE TO 10 MG BY MOUTH DAILY  STOP TAKING PLAVIX ON January 28, 2019 PER KATY THOMPSON PA-C  If you need a refill on your cardiac medications before your next appointment, please call your pharmacy.       Follow-Up:  3 MONTHS WITH DR. CRENSHAW  Your physician wants you to follow-up in: ONE YEAR WITH KATHRYN THOMPSON PA-C You will receive a reminder letter in the mail two months in advance. If you don't receive a letter, please call our office to schedule the follow-up appointment.

## 2018-09-10 NOTE — Telephone Encounter (Signed)
Order for this pt to have a complete echo done today, prior to seeing Bary CastillaKaty Thompson PA-C in clinic, for post TAVR Follow-up.  Order was requested by echo tech ChelseaBilly, diagnosis s/p TAVR.

## 2018-10-24 ENCOUNTER — Encounter: Payer: Self-pay | Admitting: Thoracic Surgery (Cardiothoracic Vascular Surgery)

## 2018-11-07 ENCOUNTER — Other Ambulatory Visit: Payer: Self-pay | Admitting: Endocrinology

## 2018-11-25 ENCOUNTER — Other Ambulatory Visit: Payer: Self-pay | Admitting: Endocrinology

## 2018-12-09 NOTE — Progress Notes (Signed)
HPI: FU AS s/p TAVR.  Patient had follow-up echocardiogram August 2019 that showed normal LV function and severe aortic stenosis with mean gradient 45 mmHg.  There was moderate tricuspid regurgitation.  Cardiac catheterization August 2019 showed calcified coronaries but only mild nonobstructive disease.  There was severe aortic stenosis and moderate pulmonary hypertension felt secondary to elevated left heart pressures. Carotid Dopplers September 2019 showed 40 to 59% right and 1 to 39% left stenosis.  Had TAVR October 2019.  Follow-up echocardiogram December 2019 showed normal LV function, aortic valve replacement with mean gradient 12 mmHg, mild mitral regurgitation, mild left atrial enlargement and mild tricuspid regurgitation.  Abdominal ultrasound November 2019 showed complex left adnexal cyst and follow-up recommended 6 months.  Since last seen she has dyspnea with more vigorous activities but not routine activities.  No orthopnea, PND, pedal edema, chest pain or syncope.  Note she did have pedal edema with higher dose of amlodipine.  Current Outpatient Medications  Medication Sig Dispense Refill  . albuterol (PROVENTIL HFA;VENTOLIN HFA) 108 (90 Base) MCG/ACT inhaler Inhale 2 puffs into the lungs every 6 (six) hours as needed for wheezing or shortness of breath.    Marland Kitchen amLODipine (NORVASC) 10 MG tablet Take 1 tablet (10 mg total) by mouth daily. 90 tablet 3  . Ascorbic Acid (VITAMIN C) 1000 MG tablet Take 1,000 mg by mouth daily.    Marland Kitchen aspirin 81 MG tablet Take 81 mg by mouth daily.    . Blood Glucose Monitoring Suppl (ONETOUCH VERIO IQ SYSTEM) w/Device KIT USE TO CHECK BLOOD SUGAR ONCE DAILY Dx code E11.9 1 kit 2  . Cholecalciferol (VITAMIN D) 2000 UNITS tablet Take 2,000 Units by mouth daily.    . clopidogrel (PLAVIX) 75 MG tablet Take 1 tablet (75 mg total) by mouth daily with breakfast. 90 tablet 1  . glimepiride (AMARYL) 1 MG tablet TAKE 1 TABLET BY MOUTH AT BEDTIME 30 tablet 0  .  metFORMIN (GLUCOPHAGE-XR) 500 MG 24 hr tablet TAKE 2 TABLETS BY MOUTH ONCE DAILY WITH SUPPER 60 tablet 2  . nystatin cream (MYCOSTATIN) Apply topically 2 (two) times daily. 30 g 12  . ONETOUCH DELICA LANCETS 75F MISC USE TO CHECK BLOOD SUGAR ONCE DAILY Dx code E11.9 100 each 2  . ONETOUCH VERIO test strip USE 1 STRIP TO CHECK GLUCOSE ONCE DAILY (Patient taking differently: 1 each by Other route daily. ) 50 each 4  . pramipexole (MIRAPEX) 0.25 MG tablet Take 0.25 mg by mouth at bedtime.     . rosuvastatin (CRESTOR) 20 MG tablet Take 1 tablet (20 mg total) by mouth daily. 90 tablet 3  . sertraline (ZOLOFT) 25 MG tablet Take 25 mg by mouth at bedtime.     . vitamin B-12 (CYANOCOBALAMIN) 1000 MCG tablet Take 1,000 mcg by mouth daily.     No current facility-administered medications for this visit.      Past Medical History:  Diagnosis Date  . Anxiety   . Cervical myelopathy (HCC)    s/p cervical fusion @ [facility]  . Chronic diastolic congestive heart failure ([place])   . CKD (chronic kidney disease), stage III ([place])   . Diabetes mellitus ([place])   . Glaucoma   . HTN (hypertension)   . Hyperlipemia   . Morbid obesity ([place])   . Rheumatic fever   . S/P TAVR (transcatheter aortic valve replacement) 07/29/2018   26 mm Edwards Sapien 3 transcatheter heart valve placed via percutaneous right transfemoral approach   .  Severe aortic stenosis     Past Surgical History:  Procedure Laterality Date  . APPENDECTOMY    . CARDIAC CATHETERIZATION    . EYE SURGERY Bilateral    cataract extraction bilateral with lens  . INTRAOPERATIVE TRANSTHORACIC ECHOCARDIOGRAM  07/29/2018   Procedure: INTRAOPERATIVE TRANSTHORACIC ECHOCARDIOGRAM;  Surgeon: Sherren Mocha, MD;  Location: [place];  Service: Open Heart Surgery;;  . NECK SURGERY    . RIGHT/LEFT HEART CATH AND CORONARY ANGIOGRAPHY N/A 06/04/2018   Procedure: RIGHT/LEFT HEART CATH AND CORONARY ANGIOGRAPHY;  Surgeon: Sherren Mocha, MD;  Location: [facility];  Service: Cardiovascular;  Laterality: N/A;  . TONSILLECTOMY    . TRANSCATHETER AORTIC VALVE REPLACEMENT, TRANSFEMORAL N/A 07/29/2018   Procedure: TRANSCATHETER AORTIC VALVE REPLACEMENT, TRANSFEMORAL;  Surgeon: Sherren Mocha, MD;  Location: [facility];  Service: Open Heart Surgery;  Laterality: N/A;  . TUBAL LIGATION    . VAGINAL HYSTERECTOMY      Social History   Socioeconomic History  . Marital status: Married    Spouse name: Not on file  . Number of children: 2  . Years of education: Not on file  . Highest education level: Not on file  Occupational History  . Not on file  Social Needs  . Financial resource strain: Not on file  . Food insecurity:    Worry: Not on file    Inability: Not on file  . Transportation needs:    Medical: Not on file    Non-medical: Not on file  Tobacco Use  . Smoking status: Never Smoker  . Smokeless tobacco: Never Used  Substance and Sexual Activity  . Alcohol use: No    Alcohol/week: 0.0 standard drinks  . Drug use: Never  . Sexual activity: Not on file  Lifestyle  . Physical activity:    Days per week: Not on file    Minutes per session: Not on file  . Stress: Not on file  Relationships  . Social connections:    Talks on phone: Not on file    Gets together: Not on file    Attends religious service: Not on file    Active member of club or organization: Not on file    Attends meetings of clubs or organizations: Not on file    Relationship status: Not on file  . Intimate partner violence:    Fear of current or ex partner: Not on file    Emotionally abused: Not on file    Physically abused: Not on file    Forced sexual activity: Not on file  Other Topics Concern  . Not on file  Social History Narrative  . Not on file    Family History  Problem Relation Age of Onset  . Diabetes Mother   . Cancer Mother        Colon  . Diabetes Sister   . Diabetes Paternal Grandmother   . Breast cancer Maternal Grandmother 75     ROS: no fevers or chills, productive cough, hemoptysis, dysphasia, odynophagia, melena, hematochezia, dysuria, hematuria, rash, seizure activity, orthopnea, PND, pedal edema, claudication. Remaining systems are negative.  Physical Exam: Well-developed well-nourished in no acute distress.  Skin is warm and dry.  HEENT is normal.  Neck is supple.  Chest is clear to auscultation with normal expansion.  Cardiovascular exam is regular rate and rhythm.  2/6 systolic murmur left sternal border.  No diastolic murmur. Abdominal exam nontender or distended. No masses palpated. Extremities show no edema. neuro grossly intact  A/P  1 status post TAVR-patient doing well from a symptomatic standpoint.  We discussed SBE prophylaxis.  Continue aspirin and Plavix.  Discontinue Plavix on April 20.  2 hypertension-patient's blood pressure is controlled.  Continue present medications and follow.  3 hyperlipidemia-continue statin.  4 carotid artery disease-plan follow-up carotid Dopplers September 2020.  5 complex adnexal cyst-patient is following up with OB GYN for this issue.  6 morbid obesity-we discussed the importance of diet and weight loss.  Kirk Ruths, MD

## 2018-12-15 ENCOUNTER — Ambulatory Visit (INDEPENDENT_AMBULATORY_CARE_PROVIDER_SITE_OTHER): Payer: Medicare Other | Admitting: Cardiology

## 2018-12-15 ENCOUNTER — Other Ambulatory Visit: Payer: Self-pay | Admitting: *Deleted

## 2018-12-15 ENCOUNTER — Encounter: Payer: Self-pay | Admitting: Cardiology

## 2018-12-15 VITALS — BP 134/70 | HR 62 | Ht 60.0 in | Wt 199.0 lb

## 2018-12-15 DIAGNOSIS — Z952 Presence of prosthetic heart valve: Secondary | ICD-10-CM | POA: Diagnosis not present

## 2018-12-15 DIAGNOSIS — I10 Essential (primary) hypertension: Secondary | ICD-10-CM | POA: Diagnosis not present

## 2018-12-15 DIAGNOSIS — N83202 Unspecified ovarian cyst, left side: Secondary | ICD-10-CM

## 2018-12-15 DIAGNOSIS — I679 Cerebrovascular disease, unspecified: Secondary | ICD-10-CM

## 2018-12-15 NOTE — Patient Instructions (Signed)
Medication Instructions:  NO CHANGE If you need a refill on your cardiac medications before your next appointment, please call your pharmacy.   Lab work: If you have labs (blood work) drawn today and your tests are completely normal, you will receive your results only by: . MyChart Message (if you have MyChart) OR . A paper copy in the mail If you have any lab test that is abnormal or we need to change your treatment, we will call you to review the results.  Follow-Up: At [facility], you and your health needs are our priority.  As part of our continuing mission to provide you with exceptional heart care, we have created designated Provider Care Teams.  These Care Teams include your primary Cardiologist (physician) and Advanced Practice Providers (APPs -  Physician Assistants and Nurse Practitioners) who all work together to provide you with the care you need, when you need it. You will need a follow up appointment in 6 months.  Please call our office 2 months in advance to schedule this appointment.  You may see BRIAN CRENSHAW MD or one of the following Advanced Practice Providers on your designated Care Team:   Luke Kilroy, PA-C Krista Kroeger, PA-C . Callie Goodrich, PA-C  CALL IN July TO SCHEDULE APPOINTMENT IN SEPTEMBER 

## 2018-12-16 ENCOUNTER — Other Ambulatory Visit: Payer: Medicare Other

## 2018-12-18 ENCOUNTER — Ambulatory Visit: Payer: Medicare Other | Admitting: Endocrinology

## 2018-12-22 ENCOUNTER — Other Ambulatory Visit: Payer: Self-pay | Admitting: Endocrinology

## 2019-01-26 ENCOUNTER — Other Ambulatory Visit: Payer: Self-pay | Admitting: Endocrinology

## 2019-01-27 ENCOUNTER — Other Ambulatory Visit (INDEPENDENT_AMBULATORY_CARE_PROVIDER_SITE_OTHER): Payer: Medicare Other

## 2019-01-27 DIAGNOSIS — E1165 Type 2 diabetes mellitus with hyperglycemia: Secondary | ICD-10-CM | POA: Diagnosis not present

## 2019-01-27 LAB — COMPREHENSIVE METABOLIC PANEL WITH GFR
ALT: 13 U/L (ref 0–35)
AST: 19 U/L (ref 0–37)
Albumin: 3.8 g/dL (ref 3.5–5.2)
Alkaline Phosphatase: 83 U/L (ref 39–117)
BUN: 24 mg/dL — ABNORMAL HIGH (ref 6–23)
CO2: 28 meq/L (ref 19–32)
Calcium: 9.1 mg/dL (ref 8.4–10.5)
Chloride: 104 meq/L (ref 96–112)
Creatinine, Ser: 1.21 mg/dL — ABNORMAL HIGH (ref 0.40–1.20)
GFR: 43.66 mL/min — ABNORMAL LOW
Glucose, Bld: 192 mg/dL — ABNORMAL HIGH (ref 70–99)
Potassium: 4.6 meq/L (ref 3.5–5.1)
Sodium: 138 meq/L (ref 135–145)
Total Bilirubin: 0.3 mg/dL (ref 0.2–1.2)
Total Protein: 6.9 g/dL (ref 6.0–8.3)

## 2019-01-27 LAB — HEMOGLOBIN A1C: Hgb A1c MFr Bld: 7.3 % — ABNORMAL HIGH (ref 4.6–6.5)

## 2019-02-02 ENCOUNTER — Other Ambulatory Visit: Payer: Self-pay

## 2019-02-02 ENCOUNTER — Ambulatory Visit (INDEPENDENT_AMBULATORY_CARE_PROVIDER_SITE_OTHER): Payer: Medicare Other | Admitting: Endocrinology

## 2019-02-02 ENCOUNTER — Encounter: Payer: Self-pay | Admitting: Endocrinology

## 2019-02-02 DIAGNOSIS — E1165 Type 2 diabetes mellitus with hyperglycemia: Secondary | ICD-10-CM | POA: Diagnosis not present

## 2019-02-02 NOTE — Progress Notes (Signed)
Patient ID: Shannon Kidd, female   DOB: Apr 22, 1946, 73 y.o.   MRN: [id number]           Reason for Appointment: Followup for Type 2 Diabetes   Today's office visit was provided via telemedicine using video technique Explained to the patient and the the limitations of evaluation and management by telemedicine and the availability of in person appointments.  The patient understood the limitations and agreed to proceed. Patient also understood that the telehealth visit is billable. . Location of the patient: Home . Location of the provider: Office Only the patient and myself were participating in the encounter    Referring physician: Leighton Ruff  History of Present Illness:          Diagnosis: Type 2 diabetes mellitus, date of diagnosis: 2000        Past history: She used Metformin for her diabetes for several years until early 2015 and was taking 1g bid before it was stopped. She thinks her blood sugars are well controlled with this. Also at some point she was given Actos but this was stopped because of edema. Metformin was stopped in early 2015 on the suggestion of a nephrologist because of her renal dysfunction Since early 2015 she had been taking Januvia 50 mg daily When her blood sugars were not well controlled with A1c of 8% in 2015 she was started on low dose metformin to control fasting hyperglycemia  Recent history:   Oral hypoglycemic drugs the patient is taking are: Amaryl 1 mg at bedtime with 1000 mg metformin ER at bedtime    A1c is 7.3 as before  Current management, blood sugar patterns and problems identified:  She has been on glimepiride instead of glipizide since 11/19 since her fasting readings have been higher  However although her A1c is about the same her morning sugars are generally somewhat better  She apparently is eating differently and appears to be eating a relatively high fat breakfast, no lunch and then may be eating a large meal in the evening   Postprandial readings are not being checked but was 192 after breakfast in the lab  She will sometimes feel shaky before lunch although she was apparently asymptomatic and her sugar was 68 at about 4 PM  She is trying to do a little walking  She has difficulty tolerating regular metformin because of diarrhea  Her weight is about the same reportedly between 95 and 200   Hypoglycemia: As above        Side effects from medications have been: None Compliance with the medical regimen: Recently better  Glucose monitoring:  done 1 or less times a day         Glucometer: Verio      Blood Glucose readings by time of day and averages from meter   PRE-MEAL Fasting Lunch Dinner Bedtime Overall  Glucose range:  107-149   68-114  136   Mean/median:      126   Previous readings:  PRE-MEAL Fasting Lunch Dinner Bedtime Overall  Glucose range:  135-182      Mean/median: 155  161  117  144    Self-care:      Meals:  2-3 meals per day.  Breakfast is a sausage biscuit     Exercise: walking some almost daily      Dietician visit: Most recent: 8/15.               Weight history: 195-245 previously  Wt Readings  from Last 3 Encounters:  12/15/18 199 lb (90.3 kg)  09/10/18 199 lb 12.8 oz (90.6 kg)  08/19/18 197 lb (89.4 kg)    Lab Results  Component Value Date   HGBA1C 7.3 (H) 01/27/2019   HGBA1C 7.3 (H) 08/14/2018   HGBA1C 7.3 (H) 07/25/2018   Lab Results  Component Value Date   MICROALBUR 23.3 (H) 08/14/2018   LDLCALC 24 05/28/2017   CREATININE 1.21 (H) 01/27/2019    Allergies as of 02/02/2019      Reactions   Actos [pioglitazone] Swelling      Medication List       Accurate as of February 02, 2019 11:45 AM. Always use your most recent med list.        albuterol 108 (90 Base) MCG/ACT inhaler Commonly known as:  VENTOLIN HFA Inhale 2 puffs into the lungs every 6 (six) hours as needed for wheezing or shortness of breath.   amLODipine 10 MG tablet Commonly known as:   NORVASC Take 1 tablet (10 mg total) by mouth daily.   aspirin 81 MG tablet Take 81 mg by mouth daily.   glimepiride 1 MG tablet Commonly known as:  AMARYL TAKE 1 TABLET BY MOUTH AT BEDTIME   metFORMIN 500 MG 24 hr tablet Commonly known as:  GLUCOPHAGE-XR TAKE 2 TABLETS BY MOUTH ONCE DAILY WITH SUPPER   nystatin cream Commonly known as:  MYCOSTATIN Apply topically 2 (two) times daily.   OneTouch Delica Lancets 60Y Misc USE TO CHECK BLOOD SUGAR ONCE DAILY Dx code E11.9   OneTouch Verio IQ System w/Device Kit USE TO CHECK BLOOD SUGAR ONCE DAILY Dx code E11.9   OneTouch Verio test strip Generic drug:  glucose blood USE 1 STRIP TO CHECK GLUCOSE ONCE DAILY   pramipexole 0.25 MG tablet Commonly known as:  MIRAPEX Take 0.25 mg by mouth at bedtime.   rosuvastatin 20 MG tablet Commonly known as:  CRESTOR Take 1 tablet (20 mg total) by mouth daily.   sertraline 25 MG tablet Commonly known as:  ZOLOFT Take 25 mg by mouth at bedtime.   vitamin B-12 1000 MCG tablet Commonly known as:  CYANOCOBALAMIN Take 1,000 mcg by mouth daily.   vitamin C 1000 MG tablet Take 1,000 mg by mouth daily.   Vitamin D 50 MCG (2000 UT) tablet Take 2,000 Units by mouth daily.       Allergies:  Allergies  Allergen Reactions  . Actos [Pioglitazone] Swelling    Past Medical History:  Diagnosis Date  . Anxiety   . Cervical myelopathy (HCC)    s/p cervical fusion @ [facility]  . Chronic diastolic congestive heart failure ([place])   . CKD (chronic kidney disease), stage III ([place])   . Diabetes mellitus ([place])   . Glaucoma   . HTN (hypertension)   . Hyperlipemia   . Morbid obesity ([place])   . Rheumatic fever   . S/P TAVR (transcatheter aortic valve replacement) 07/29/2018   26 mm Edwards Sapien 3 transcatheter heart valve placed via percutaneous right transfemoral approach   . Severe aortic stenosis     Past Surgical History:  Procedure Laterality Date  . APPENDECTOMY    . CARDIAC  CATHETERIZATION    . EYE SURGERY Bilateral    cataract extraction bilateral with lens  . INTRAOPERATIVE TRANSTHORACIC ECHOCARDIOGRAM  07/29/2018   Procedure: INTRAOPERATIVE TRANSTHORACIC ECHOCARDIOGRAM;  Surgeon: Sherren Mocha, MD;  Location: [facility];  Service: Open Heart Surgery;;  . NECK SURGERY    . RIGHT/LEFT HEART  CATH AND CORONARY ANGIOGRAPHY N/A 06/04/2018   Procedure: RIGHT/LEFT HEART CATH AND CORONARY ANGIOGRAPHY;  Surgeon: Sherren Mocha, MD;  Location: [facility];  Service: Cardiovascular;  Laterality: N/A;  . TONSILLECTOMY    . TRANSCATHETER AORTIC VALVE REPLACEMENT, TRANSFEMORAL N/A 07/29/2018   Procedure: TRANSCATHETER AORTIC VALVE REPLACEMENT, TRANSFEMORAL;  Surgeon: Sherren Mocha, MD;  Location: [facility];  Service: Open Heart Surgery;  Laterality: N/A;  . TUBAL LIGATION    . VAGINAL HYSTERECTOMY      Family History  Problem Relation Age of Onset  . Diabetes Mother   . Cancer Mother        Colon  . Diabetes Sister   . Diabetes Paternal Grandmother   . Breast cancer Maternal Grandmother 51    Social History:  reports that she has never smoked. She has never used smokeless tobacco. She reports that she does not drink alcohol or use drugs.    Review of Systems       Lipids: She has been treated with Crestor 20 mg by cardiologist HDL tends to be low Last LDL done by PCP was 22       Lab Results  Component Value Date   CHOL 78 05/28/2017   HDL 32.30 (L) 05/28/2017   LDLCALC 24 05/28/2017   TRIG 109.0 05/28/2017   CHOLHDL 2 05/28/2017                  The blood pressure has been controlled with Norvasc   BP Readings from Last 3 Encounters:  12/15/18 134/70  09/10/18 (!) 162/72  08/19/18 140/72        Diabetic foot exam done in 12/2017 with normal findings   RENAL dysfunction: Although her creatinine had gone up to 1.53 on  a previous visit it is now more consistently normal  Does not see nephrologist currently  Lab Results  Component Value  Date   CREATININE 1.21 (H) 01/27/2019   CREATININE 1.31 (H) 08/14/2018   CREATININE 1.26 (H) 07/31/2018    She is on vitamin D supplements followed by PCP   LABS:  Lab on 01/27/2019  Component Date Value Ref Range Status  . Sodium 01/27/2019 138  135 - 145 mEq/L Final  . Potassium 01/27/2019 4.6  3.5 - 5.1 mEq/L Final  . Chloride 01/27/2019 104  96 - 112 mEq/L Final  . CO2 01/27/2019 28  19 - 32 mEq/L Final  . Glucose, Bld 01/27/2019 192* 70 - 99 mg/dL Final  . BUN 01/27/2019 24* 6 - 23 mg/dL Final  . Creatinine, Ser 01/27/2019 1.21* 0.40 - 1.20 mg/dL Final  . Total Bilirubin 01/27/2019 0.3  0.2 - 1.2 mg/dL Final  . Alkaline Phosphatase 01/27/2019 83  39 - 117 U/L Final  . AST 01/27/2019 19  0 - 37 U/L Final  . ALT 01/27/2019 13  0 - 35 U/L Final  . Total Protein 01/27/2019 6.9  6.0 - 8.3 g/dL Final  . Albumin 01/27/2019 3.8  3.5 - 5.2 g/dL Final  . Calcium 01/27/2019 9.1  8.4 - 10.5 mg/dL Final  . GFR 01/27/2019 43.66* >60.00 mL/min Final  . Hgb A1c MFr Bld 01/27/2019 7.3* 4.6 - 6.5 % Final   Glycemic Control Guidelines for People with Diabetes:Non Diabetic:  <6%Goal of Therapy: <7%Additional Action Suggested:  >8%     Physical Examination:  There were no vitals taken for this visit.    ASSESSMENT:  Diabetes type 2 with obesity, BMI 38  See history of present illness  for discussion of current diabetes management, blood sugar patterns and problems identified  Her A1c is still about the same at 7.3 Again this is higher than expected for her home blood sugars was averaging about 135 in the morning and lower in the afternoon Has minimal postprandial monitoring Her fasting readings are reasonably good with some variability on Amaryl compared to glipizide Discussed that most of her variability in blood sugars are related to her diet which is relatively high fat at breakfast, she is skipping lunch and probably eating a large evening meal  RENAL dysfunction: Has been  borderline and stable Also history of mild increase in urine microalbumin   PLAN:  Since she is taking Amaryl she will need to have a snack in the early afternoon around 1 PM She will need to cut back on high fat meals in the morning and can have an egg and toast for instance instead of biscuits She thinks she can cut back on her portions at dinnertime also She will call if she has any tendency to hypoglycemia More readings after dinner are needed  Follow-up in 3 months  There are no Patient Instructions on file for this visit.   Elayne Snare 02/02/2019, 11:45 AM   Note: This office note was prepared with Dragon voice recognition system technology. Any transcriptional errors that result from this process are unintentional.

## 2019-02-09 ENCOUNTER — Other Ambulatory Visit: Payer: Self-pay | Admitting: Endocrinology

## 2019-04-02 ENCOUNTER — Other Ambulatory Visit: Payer: Self-pay | Admitting: Endocrinology

## 2019-04-08 ENCOUNTER — Telehealth: Payer: Self-pay | Admitting: Cardiology

## 2019-04-08 ENCOUNTER — Telehealth: Payer: Self-pay | Admitting: Endocrinology

## 2019-04-08 ENCOUNTER — Other Ambulatory Visit: Payer: Self-pay

## 2019-04-08 DIAGNOSIS — I350 Nonrheumatic aortic (valve) stenosis: Secondary | ICD-10-CM

## 2019-04-08 MED ORDER — ROSUVASTATIN CALCIUM 20 MG PO TABS: 20.0000 mg | ORAL_TABLET | Freq: Every day | ORAL | 3 refills | Status: DC

## 2019-04-08 MED ORDER — AMLODIPINE BESYLATE 5 MG PO TABS: 5.0000 mg | ORAL_TABLET | Freq: Every day | ORAL | 3 refills | Status: DC

## 2019-04-08 MED ORDER — METFORMIN HCL ER 500 MG PO TB24: ORAL_TABLET | ORAL | 1 refills | Status: DC

## 2019-04-08 MED ORDER — GLIMEPIRIDE 1 MG PO TABS: 1.0000 mg | ORAL_TABLET | Freq: Every day | ORAL | 1 refills | Status: DC

## 2019-04-08 NOTE — Telephone Encounter (Signed)
New message    *STAT* If patient is at the pharmacy, call can be transferred to refill team.   1. Which medications need to be refilled? (please list name of each medication and dose if known) amLODipine (NORVASC) 10 MG tablet   rosuvastatin (CRESTOR) 20 MG tablet   2. Which pharmacy/location (including street and city if local pharmacy) is medication to be sent to?[facility] [address] in [place], [place]  3. Do they need a 30 day or 90 day supply? [place]

## 2019-04-08 NOTE — Telephone Encounter (Signed)
Shannon Kidd with [name] PCP [facility] (assisting patient) ph# [phone] requests the 2 following RX's:  MEDICATION: glimepiride (AMARYL) 1 MG tablet & metFORMIN (GLUCOPHAGE-XR) 500 MG 24 hr tablet  PHARMACY:  New preferred PHARM: [facility]-in [place]-Ph# [phone]/Fax# [phone]  IS THIS A 90 DAY SUPPLY : Yes for both RX's  IS PATIENT OUT OF MEDICATION: no  IF NOT; HOW MUCH IS LEFT: 1 day  LAST APPOINTMENT DATE: @6 /25/2020  NEXT APPOINTMENT DATE:@7 /27/2020  DO WE HAVE YOUR PERMISSION TO LEAVE A DETAILED MESSAGE: Yes  OTHER COMMENTS:    **Let patient know to contact pharmacy at the end of the day to make sure medication is ready. **  ** Please notify patient to allow 48-72 hours to process**  **Encourage patient to contact the pharmacy for refills or they can request refills through [facility]**

## 2019-04-08 NOTE — Telephone Encounter (Signed)
Rx sent 

## 2019-05-04 ENCOUNTER — Other Ambulatory Visit: Payer: Medicare Other

## 2019-05-04 ENCOUNTER — Other Ambulatory Visit: Payer: Self-pay | Admitting: Cardiology

## 2019-05-04 ENCOUNTER — Other Ambulatory Visit: Payer: Self-pay

## 2019-05-04 DIAGNOSIS — I350 Nonrheumatic aortic (valve) stenosis: Secondary | ICD-10-CM

## 2019-05-04 MED ORDER — METFORMIN HCL ER 500 MG PO TB24: ORAL_TABLET | ORAL | 1 refills | Status: DC

## 2019-05-04 MED ORDER — ROSUVASTATIN CALCIUM 20 MG PO TABS: 20.0000 mg | ORAL_TABLET | Freq: Every day | ORAL | 3 refills | Status: DC

## 2019-05-04 MED ORDER — GLIMEPIRIDE 1 MG PO TABS: 1.0000 mg | ORAL_TABLET | Freq: Every day | ORAL | 1 refills | Status: DC

## 2019-05-04 NOTE — Telephone Encounter (Signed)
Sonia Baller from [facility] called in regards to patient stating [facility] did not receive these referral. She will be calling pharmacy for an update.  Please Advise, Thanks

## 2019-05-04 NOTE — Telephone Encounter (Signed)
Resent both Rx's to [facility] again.

## 2019-05-04 NOTE — Telephone Encounter (Signed)
°*  STAT* If patient is at the pharmacy, call can be transferred to refill team.   1. Which medications need to be refilled? (please list name of each medication and dose if known)  New ppescription for Rosuvastatin  2. Which pharmacy/location (including street and city if local pharmacy) is medication to be sent to? [facility]- Fax is [phone]  3. Do they need a 30 day or 90 day supply? 90 and refills

## 2019-05-07 ENCOUNTER — Ambulatory Visit: Payer: Medicare Other | Admitting: Endocrinology

## 2019-05-12 ENCOUNTER — Other Ambulatory Visit: Payer: Medicare Other

## 2019-05-14 ENCOUNTER — Ambulatory Visit: Payer: Medicare Other | Admitting: Endocrinology

## 2019-05-22 ENCOUNTER — Other Ambulatory Visit (INDEPENDENT_AMBULATORY_CARE_PROVIDER_SITE_OTHER): Payer: Medicare Other

## 2019-05-22 ENCOUNTER — Other Ambulatory Visit: Payer: Self-pay

## 2019-05-22 DIAGNOSIS — E1165 Type 2 diabetes mellitus with hyperglycemia: Secondary | ICD-10-CM

## 2019-05-22 LAB — COMPREHENSIVE METABOLIC PANEL WITH GFR
ALT: 16 U/L (ref 0–35)
AST: 24 U/L (ref 0–37)
Albumin: 4 g/dL (ref 3.5–5.2)
Alkaline Phosphatase: 86 U/L (ref 39–117)
BUN: 23 mg/dL (ref 6–23)
CO2: 28 meq/L (ref 19–32)
Calcium: 9.4 mg/dL (ref 8.4–10.5)
Chloride: 103 meq/L (ref 96–112)
Creatinine, Ser: 1.14 mg/dL (ref 0.40–1.20)
GFR: 46.73 mL/min — ABNORMAL LOW
Glucose, Bld: 146 mg/dL — ABNORMAL HIGH (ref 70–99)
Potassium: 4.7 meq/L (ref 3.5–5.1)
Sodium: 138 meq/L (ref 135–145)
Total Bilirubin: 0.3 mg/dL (ref 0.2–1.2)
Total Protein: 7 g/dL (ref 6.0–8.3)

## 2019-05-22 LAB — MICROALBUMIN / CREATININE URINE RATIO
Creatinine,U: 17.6 mg/dL
Microalb Creat Ratio: 51.7 mg/g — ABNORMAL HIGH (ref 0.0–30.0)
Microalb, Ur: 9.1 mg/dL — ABNORMAL HIGH (ref 0.0–1.9)

## 2019-05-22 LAB — HEMOGLOBIN A1C: Hgb A1c MFr Bld: 7.9 % — ABNORMAL HIGH (ref 4.6–6.5)

## 2019-05-26 ENCOUNTER — Ambulatory Visit (INDEPENDENT_AMBULATORY_CARE_PROVIDER_SITE_OTHER): Payer: Medicare Other | Admitting: Endocrinology

## 2019-05-26 ENCOUNTER — Encounter: Payer: Self-pay | Admitting: Endocrinology

## 2019-05-26 ENCOUNTER — Other Ambulatory Visit: Payer: Self-pay

## 2019-05-26 DIAGNOSIS — E1129 Type 2 diabetes mellitus with other diabetic kidney complication: Secondary | ICD-10-CM | POA: Diagnosis not present

## 2019-05-26 DIAGNOSIS — E1165 Type 2 diabetes mellitus with hyperglycemia: Secondary | ICD-10-CM | POA: Diagnosis not present

## 2019-05-26 DIAGNOSIS — R809 Proteinuria, unspecified: Secondary | ICD-10-CM | POA: Diagnosis not present

## 2019-05-26 DIAGNOSIS — N289 Disorder of kidney and ureter, unspecified: Secondary | ICD-10-CM

## 2019-05-26 MED ORDER — RYBELSUS 3 MG PO TABS: 3.0000 mg | ORAL_TABLET | Freq: Every day | ORAL | 0 refills | Status: DC

## 2019-05-26 NOTE — Progress Notes (Signed)
Patient ID: Shannon Kidd, female   DOB: Mar 16, 1946, 73 y.o.   MRN: [id number]           Reason for Appointment: Followup for Type 2 Diabetes   Today's office visit was provided via telemedicine using video technique Explained to the patient and the the limitations of evaluation and management by telemedicine and the availability of in person appointments.  The patient understood the limitations and agreed to proceed. Patient also understood that the telehealth visit is billable. . Location of the patient: Home . Location of the provider: Office Only the patient and myself were participating in the encounter    Referring physician: Leighton Ruff  History of Present Illness:          Diagnosis: Type 2 diabetes mellitus, date of diagnosis: 2000        Past history: She used Metformin for her diabetes for several years until early 2015 and was taking 1g bid before it was stopped. She thinks her blood sugars are well controlled with this. Also at some point she was given Actos but this was stopped because of edema. Metformin was stopped in early 2015 on the suggestion of a nephrologist because of her renal dysfunction Since early 2015 she had been taking Januvia 50 mg daily When her blood sugars were not well controlled with A1c of 8% in 2015 she was started on low dose metformin to control fasting hyperglycemia  Recent history:   Oral hypoglycemic drugs the patient is taking are: Amaryl 1 mg at bedtime with 1000 mg metformin ER at bedtime      Current management, blood sugar patterns and problems identified:  Her A1c has gone up to 7.9, previously 7.3  She is likely not watching her diet consistently as her morning sugars are fluctuating significantly  She thinks that she is probably eating more sweets at times  However has not done any blood sugars after supper and most readings are in the mornings  She is trying to be a little active but not able to do much especially  with not being able to go out or the hot weather  He also is complaining of fatigue limiting her activity  Not clear what her weight is recently  Not following any particular diet and may not be getting low fat meals consistently including breakfast  She has difficulty tolerating regular metformin because of diarrhea  Her weight is about the same reportedly between 95 and 200   Hypoglycemia: As above        Side effects from medications have been: Diarrhea from high-dose or regular metformin  Glucose monitoring:  done 1 or less times a day         Glucometer: Verio      Blood Glucose readings by time of day and averages from meter   PRE-MEAL Fasting  1-3 PM Dinner Bedtime Overall  Glucose range:  123-223  132-230  112, 113    Mean/median:  175     158  Previous readings:  PRE-MEAL Fasting Lunch Dinner Bedtime Overall  Glucose range:  107-149   68-114  136   Mean/median:      126     Self-care:      Meals:  2-3 meals per day.  Breakfast is a sausage biscuit          Dietician visit: Most recent: 8/15.               Weight history: 195-245 previously  Wt Readings from Last 3 Encounters:  12/15/18 199 lb (90.3 kg)  09/10/18 199 lb 12.8 oz (90.6 kg)  08/19/18 197 lb (89.4 kg)    Lab Results  Component Value Date   HGBA1C 7.9 (H) 05/22/2019   HGBA1C 7.3 (H) 01/27/2019   HGBA1C 7.3 (H) 08/14/2018   Lab Results  Component Value Date   MICROALBUR 9.1 (H) 05/22/2019   LDLCALC 24 05/28/2017   CREATININE 1.14 05/22/2019    Allergies as of 05/26/2019      Reactions   Actos [pioglitazone] Swelling      Medication List       Accurate as of May 26, 2019  1:39 PM. If you have any questions, ask your nurse or doctor.        albuterol 108 (90 Base) MCG/ACT inhaler Commonly known as: VENTOLIN HFA Inhale 2 puffs into the lungs every 6 (six) hours as needed for wheezing or shortness of breath.   amLODipine 5 MG tablet Commonly known as: NORVASC Take 1 tablet  (5 mg total) by mouth daily.   aspirin 81 MG tablet Take 81 mg by mouth daily.   glimepiride 1 MG tablet Commonly known as: AMARYL Take 1 tablet (1 mg total) by mouth at bedtime.   metFORMIN 500 MG 24 hr tablet Commonly known as: GLUCOPHAGE-XR TAKE 2 TABLETS BY MOUTH ONCE DAILY WITH SUPPER   nystatin cream Commonly known as: MYCOSTATIN Apply topically 2 (two) times daily. What changed:   when to take this  reasons to take this   OneTouch Delica Lancets 78L Misc USE TO CHECK BLOOD SUGAR ONCE DAILY Dx code E11.9   OneTouch Verio IQ System w/Device Kit USE TO CHECK BLOOD SUGAR ONCE DAILY Dx code E11.9   OneTouch Verio test strip Generic drug: glucose blood USE 1 STRIP TO CHECK GLUCOSE ONCE DAILY   pramipexole 0.25 MG tablet Commonly known as: MIRAPEX Take 0.25 mg by mouth at bedtime.   rosuvastatin 20 MG tablet Commonly known as: CRESTOR Take 1 tablet (20 mg total) by mouth daily.   Rybelsus 3 MG Tabs Generic drug: Semaglutide Take 3 mg by mouth daily before breakfast. Started by: Elayne Snare, MD   sertraline 25 MG tablet Commonly known as: ZOLOFT Take 25 mg by mouth at bedtime.   vitamin B-12 1000 MCG tablet Commonly known as: CYANOCOBALAMIN Take 1,000 mcg by mouth daily.   vitamin C 1000 MG tablet Take 1,000 mg by mouth daily.   Vitamin D 50 MCG (2000 UT) tablet Take 2,000 Units by mouth daily.       Allergies:  Allergies  Allergen Reactions  . Actos [Pioglitazone] Swelling    Past Medical History:  Diagnosis Date  . Anxiety   . Cervical myelopathy (HCC)    s/p cervical fusion @ [facility]  . Chronic diastolic congestive heart failure ([place])   . CKD (chronic kidney disease), stage III ([place])   . Diabetes mellitus ([place])   . Glaucoma   . HTN (hypertension)   . Hyperlipemia   . Morbid obesity ([place])   . Rheumatic fever   . S/P TAVR (transcatheter aortic valve replacement) 07/29/2018   26 mm Edwards Sapien 3 transcatheter heart valve placed  via percutaneous right transfemoral approach   . Severe aortic stenosis     Past Surgical History:  Procedure Laterality Date  . APPENDECTOMY    . CARDIAC CATHETERIZATION    . EYE SURGERY Bilateral    cataract extraction bilateral with lens  . INTRAOPERATIVE TRANSTHORACIC  ECHOCARDIOGRAM  07/29/2018   Procedure: INTRAOPERATIVE TRANSTHORACIC ECHOCARDIOGRAM;  Surgeon: Sherren Mocha, MD;  Location: [facility];  Service: Open Heart Surgery;;  . NECK SURGERY    . RIGHT/LEFT HEART CATH AND CORONARY ANGIOGRAPHY N/A 06/04/2018   Procedure: RIGHT/LEFT HEART CATH AND CORONARY ANGIOGRAPHY;  Surgeon: Sherren Mocha, MD;  Location: [facility];  Service: Cardiovascular;  Laterality: N/A;  . TONSILLECTOMY    . TRANSCATHETER AORTIC VALVE REPLACEMENT, TRANSFEMORAL N/A 07/29/2018   Procedure: TRANSCATHETER AORTIC VALVE REPLACEMENT, TRANSFEMORAL;  Surgeon: Sherren Mocha, MD;  Location: [facility];  Service: Open Heart Surgery;  Laterality: N/A;  . TUBAL LIGATION    . VAGINAL HYSTERECTOMY      Family History  Problem Relation Age of Onset  . Diabetes Mother   . Cancer Mother        Colon  . Diabetes Sister   . Diabetes Paternal Grandmother   . Breast cancer Maternal Grandmother 32    Social History:  reports that she has never smoked. She has never used smokeless tobacco. She reports that she does not drink alcohol or use drugs.    Review of Systems       Lipids: She has been treated with Crestor 20 mg by cardiologist HDL tends to be low Last LDL done by PCP was 22       Lab Results  Component Value Date   CHOL 78 05/28/2017   HDL 32.30 (L) 05/28/2017   LDLCALC 24 05/28/2017   TRIG 109.0 05/28/2017   CHOLHDL 2 05/28/2017                  The blood pressure has been controlled with Norvasc She thinks her recent blood pressure was 130/74 elsewhere  BP Readings from Last 3 Encounters:  12/15/18 134/70  09/10/18 (!) 162/72  08/19/18 140/72        Diabetic foot exam done in 12/2017  with normal findings   RENAL dysfunction: Although her creatinine had gone up to 1.53 on  a previous visit it is now progressively improved  Also has had microalbuminuria previously, not on ACE inhibitor or ARB   Lab Results  Component Value Date   CREATININE 1.14 05/22/2019   CREATININE 1.21 (H) 01/27/2019   CREATININE 1.31 (H) 08/14/2018    She is on vitamin D supplements which is followed by PCP   LABS:  Lab on 05/22/2019  Component Date Value Ref Range Status  . Sodium 05/22/2019 138  135 - 145 mEq/L Final  . Potassium 05/22/2019 4.7  3.5 - 5.1 mEq/L Final  . Chloride 05/22/2019 103  96 - 112 mEq/L Final  . CO2 05/22/2019 28  19 - 32 mEq/L Final  . Glucose, Bld 05/22/2019 146* 70 - 99 mg/dL Final  . BUN 05/22/2019 23  6 - 23 mg/dL Final  . Creatinine, Ser 05/22/2019 1.14  0.40 - 1.20 mg/dL Final  . Total Bilirubin 05/22/2019 0.3  0.2 - 1.2 mg/dL Final  . Alkaline Phosphatase 05/22/2019 86  39 - 117 U/L Final  . AST 05/22/2019 24  0 - 37 U/L Final  . ALT 05/22/2019 16  0 - 35 U/L Final  . Total Protein 05/22/2019 7.0  6.0 - 8.3 g/dL Final  . Albumin 05/22/2019 4.0  3.5 - 5.2 g/dL Final  . Calcium 05/22/2019 9.4  8.4 - 10.5 mg/dL Final  . GFR 05/22/2019 46.73* >60.00 mL/min Final  . Hgb A1c MFr Bld 05/22/2019 7.9* 4.6 - 6.5 % Final  Glycemic Control Guidelines for People with Diabetes:Non Diabetic:  <6%Goal of Therapy: <7%Additional Action Suggested:  >8%   . Microalb, Ur 05/22/2019 9.1* 0.0 - 1.9 mg/dL Final  . Creatinine,U 05/22/2019 17.6  mg/dL Final  . Microalb Creat Ratio 05/22/2019 51.7* 0.0 - 30.0 mg/g Final    Physical Examination:  There were no vitals taken for this visit.    ASSESSMENT:  Diabetes type 2 with obesity, BMI 38  See history of present illness for discussion of current diabetes management, blood sugar patterns and problems identified  Her A1c is still above her target of 7% and now almost 8%  She has blood sugars as high as 230 and  some high readings are in the mornings also Dietary compliance with choice of low carbohydrate and low-fat meals is inconsistent However has not done readings after dinner Also as before her exercise level is limited Not losing weight recently  RENAL dysfunction: Has been borderline and not improved Also history of mild increase in urine microalbumin Currently not on ARB or ACE   PLAN:  Since she has difficulty losing weight and has postprandial hyperglycemia frequently she will be a good candidate for a GLP-1 drug Discussed with the patient the nature of GLP-1 drugs, the actions on insulin secretion, slowing stomach emptying, reduction of appetite and reduced liver glucose production  Explained that RYBELSUS improves blood sugar control as well as produces weight loss and reduces cardiovascular events. Explained possible side effects especially nausea and vomiting that may initially; usually side effects improved with time.  Patient to call if nausea or vomiting does not improve within 2 weeks Have explained the need to take the capsules on empty stomach 30 minutes before breakfast daily.  Patient education material will be mailed With starting Rybelsus she will take only half Amaryl and if blood sugars are consistently below 100 we will stop this She will let us know in 1 month how her blood sugars are doing for dosage adjustment of the Rybelsus  Will forward her labs to PCP and she will have both her anemia and thyroid levels checked for fatigue She will also be a candidate for starting an ARB drug since creatinine is normal now  Follow-up in 2 months  Total visit time for evaluation and management of multiple problems and counseling =25 minutes  There are no Patient Instructions on file for this visit.   Elayne Snare 05/26/2019, 1:39 PM   Note: This office note was prepared with Dragon voice recognition system technology. Any transcriptional errors that result from this process are  unintentional.

## 2019-06-10 ENCOUNTER — Other Ambulatory Visit: Payer: Self-pay | Admitting: Physician Assistant

## 2019-06-10 DIAGNOSIS — Z952 Presence of prosthetic heart valve: Secondary | ICD-10-CM

## 2019-06-16 ENCOUNTER — Telehealth: Payer: Self-pay | Admitting: Endocrinology

## 2019-06-16 ENCOUNTER — Other Ambulatory Visit: Payer: Self-pay

## 2019-06-16 MED ORDER — RYBELSUS 3 MG PO TABS: 3.0000 mg | ORAL_TABLET | Freq: Every day | ORAL | 0 refills | Status: DC

## 2019-06-16 NOTE — Telephone Encounter (Signed)
Do you want 7mg  tabs sent or 3mg  tabs?

## 2019-06-16 NOTE — Telephone Encounter (Signed)
Rybelsus is working well  She is on her last pack of 10 of 3 mg  She states it needs to be called [facility] [place]

## 2019-06-16 NOTE — Telephone Encounter (Signed)
Called pt and she stated that her blood sugars have not been above 150 in the am or 180 after meals. Rx for 3mg  tabs sent to pharmacy.

## 2019-06-16 NOTE — Telephone Encounter (Signed)
If she is having any blood sugars over 150 in the morning or 180 after meals will increase to 7 mg otherwise stay on 3 mg every morning correct

## 2019-06-26 ENCOUNTER — Other Ambulatory Visit: Payer: Self-pay | Admitting: Endocrinology

## 2019-07-13 NOTE — Progress Notes (Signed)
HPI: FU AS s/p TAVR.  Patient had follow-up echocardiogram August 2019 that showed normal LV function and severe aortic stenosis with mean gradient 45 mmHg.  There was moderate tricuspid regurgitation.  Cardiac catheterization August 2019 showed calcified coronaries but only mild nonobstructive disease.  There was severe aortic stenosis and moderate pulmonary hypertension felt secondary to elevated left heart pressures. Carotid Dopplers September 2019 showed 40 to 59% right and 1 to 39% left stenosis.  Had TAVR October 2019.  Follow-up echocardiogram December 2019 showed normal LV function, aortic valve replacement with mean gradient 12 mmHg, mild mitral regurgitation, mild left atrial enlargement and mild tricuspid regurgitation.    Echocardiogram repeated October 2020.  Normal LV function, stable aortic valve replacement with mean gradient 11 mmHg and no aortic insufficiency.  Since last seen she has dyspnea with more vigorous activities but not routine activities.  No chest pain, pedal edema or syncope.  Current Outpatient Medications  Medication Sig Dispense Refill  . albuterol (PROVENTIL HFA;VENTOLIN HFA) 108 (90 Base) MCG/ACT inhaler Inhale 2 puffs into the lungs every 6 (six) hours as needed for wheezing or shortness of breath.    Marland Kitchen amLODipine (NORVASC) 5 MG tablet Take 1 tablet (5 mg total) by mouth daily. 180 tablet 3  . Ascorbic Acid (VITAMIN C) 1000 MG tablet Take 1,000 mg by mouth daily.    Marland Kitchen aspirin 81 MG tablet Take 81 mg by mouth daily.    . Blood Glucose Monitoring Suppl (ONETOUCH VERIO IQ SYSTEM) w/Device KIT USE TO CHECK BLOOD SUGAR ONCE DAILY Dx code E11.9 1 kit 2  . Cholecalciferol (VITAMIN D) 2000 UNITS tablet Take 2,000 Units by mouth daily.    Marland Kitchen glimepiride (AMARYL) 1 MG tablet Take 1 tablet (1 mg total) by mouth at bedtime. (Patient taking differently: Take 0.5 mg by mouth at bedtime. ) 90 tablet 1  . metFORMIN (GLUCOPHAGE-XR) 500 MG 24 hr tablet TAKE 2 TABLETS BY MOUTH  ONCE DAILY WITH SUPPER 180 tablet 1  . nystatin cream (MYCOSTATIN) Apply topically 2 (two) times daily. (Patient taking differently: Apply topically 2 (two) times daily as needed. ) 30 g 12  . ONETOUCH DELICA LANCETS 72C MISC USE TO CHECK BLOOD SUGAR ONCE DAILY Dx code E11.9 100 each 2  . ONETOUCH VERIO test strip USE 1 STRIP TO CHECK GLUCOSE ONCE DAILY 50 each 0  . pramipexole (MIRAPEX) 0.25 MG tablet Take 0.25 mg by mouth at bedtime.     . rosuvastatin (CRESTOR) 20 MG tablet Take 1 tablet (20 mg total) by mouth daily. 90 tablet 3  . Semaglutide (RYBELSUS) 3 MG TABS Take 3 mg by mouth daily before breakfast. 30 tablet 0  . sertraline (ZOLOFT) 25 MG tablet Take 25 mg by mouth at bedtime.     . vitamin B-12 (CYANOCOBALAMIN) 1000 MCG tablet Take 1,000 mcg by mouth daily.     No current facility-administered medications for this visit.      Past Medical History:  Diagnosis Date  . Anxiety   . Cervical myelopathy (HCC)    s/p cervical fusion @ [facility]  . Chronic diastolic congestive heart failure ([place])   . CKD (chronic kidney disease), stage III   . Diabetes mellitus ([place])   . Glaucoma   . HTN (hypertension)   . Hyperlipemia   . Morbid obesity ([place])   . Rheumatic fever   . S/P TAVR (transcatheter aortic valve replacement) 07/29/2018   26 mm Edwards Sapien 3 transcatheter heart valve  placed via percutaneous right transfemoral approach   . Severe aortic stenosis     Past Surgical History:  Procedure Laterality Date  . APPENDECTOMY    . CARDIAC CATHETERIZATION    . EYE SURGERY Bilateral    cataract extraction bilateral with lens  . INTRAOPERATIVE TRANSTHORACIC ECHOCARDIOGRAM  07/29/2018   Procedure: INTRAOPERATIVE TRANSTHORACIC ECHOCARDIOGRAM;  Surgeon: Sherren Mocha, MD;  Location: [facility];  Service: Open Heart Surgery;;  . NECK SURGERY    . RIGHT/LEFT HEART CATH AND CORONARY ANGIOGRAPHY N/A 06/04/2018   Procedure: RIGHT/LEFT HEART CATH AND CORONARY ANGIOGRAPHY;  Surgeon:  Sherren Mocha, MD;  Location: [place] [facility];  Service: Cardiovascular;  Laterality: N/A;  . TONSILLECTOMY    . TRANSCATHETER AORTIC VALVE REPLACEMENT, TRANSFEMORAL N/A 07/29/2018   Procedure: TRANSCATHETER AORTIC VALVE REPLACEMENT, TRANSFEMORAL;  Surgeon: Sherren Mocha, MD;  Location: [place];  Service: Open Heart Surgery;  Laterality: N/A;  . TUBAL LIGATION    . VAGINAL HYSTERECTOMY      Social History   Socioeconomic History  . Marital status: Married    Spouse name: Not on file  . Number of children: 2  . Years of education: Not on file  . Highest education level: Not on file  Occupational History  . Not on file  Social Needs  . Financial resource strain: Not on file  . Food insecurity    Worry: Not on file    Inability: Not on file  . Transportation needs    Medical: Not on file    Non-medical: Not on file  Tobacco Use  . Smoking status: Never Smoker  . Smokeless tobacco: Never Used  Substance and Sexual Activity  . Alcohol use: No    Alcohol/week: 0.0 standard drinks  . Drug use: Never  . Sexual activity: Not on file  Lifestyle  . Physical activity    Days per week: Not on file    Minutes per session: Not on file  . Stress: Not on file  Relationships  . Social Herbalist on phone: Not on file    Gets together: Not on file    Attends religious service: Not on file    Active member of club or organization: Not on file    Attends meetings of clubs or organizations: Not on file    Relationship status: Not on file  . Intimate partner violence    Fear of current or ex partner: Not on file    Emotionally abused: Not on file    Physically abused: Not on file    Forced sexual activity: Not on file  Other Topics Concern  . Not on file  Social History Narrative  . Not on file    Family History  Problem Relation Age of Onset  . Diabetes Mother   . Cancer Mother        Colon  . Diabetes Sister   . Diabetes Paternal Grandmother   . Breast  cancer Maternal Grandmother 75    ROS: no fevers or chills, productive cough, hemoptysis, dysphasia, odynophagia, melena, hematochezia, dysuria, hematuria, rash, seizure activity, orthopnea, PND, pedal edema, claudication. Remaining systems are negative.  Physical Exam: Well-developed obese in no acute distress.  Skin is warm and dry.  HEENT is normal.  Neck is supple.  Chest is clear to auscultation with normal expansion.  Cardiovascular exam is regular rate and rhythm.  2/6 systolic murmur left sternal border.  No diastolic murmur. Abdominal exam nontender or distended. No masses palpated.  Extremities show no edema. neuro grossly intact  ECG- Sinus; no ST changes; personally reviewed  A/P  1 status post TAVR-patient continues to do well from a symptomatic standpoint.  Continue SBE prophylaxis as well as aspirin.  Plavix has been discontinued.  Most recent echocardiogram showed normally functioning valve.  2 carotid artery disease-we will arrange follow-up carotid Dopplers.  3 hypertension-blood pressure is controlled.  Continue present medications.  4 hyperlipidemia-continue statin.  5 morbid obesity-we discussed the importance of diet, exercise and weight loss.    Kirk Ruths, MD

## 2019-07-20 ENCOUNTER — Ambulatory Visit (HOSPITAL_COMMUNITY): Payer: Medicare Other | Attending: Cardiology

## 2019-07-20 ENCOUNTER — Encounter (INDEPENDENT_AMBULATORY_CARE_PROVIDER_SITE_OTHER): Payer: Self-pay

## 2019-07-20 ENCOUNTER — Other Ambulatory Visit: Payer: Self-pay

## 2019-07-20 DIAGNOSIS — Z952 Presence of prosthetic heart valve: Secondary | ICD-10-CM | POA: Insufficient documentation

## 2019-07-20 NOTE — Progress Notes (Signed)
2D Echocardiogram has been performed. [facility] Majesta Leichter RDCS 07/20/19  2:18pm

## 2019-07-21 ENCOUNTER — Telehealth: Payer: Self-pay | Admitting: Physician Assistant

## 2019-07-21 NOTE — Telephone Encounter (Signed)
   [place] VALVE TEAM   Patient is due for her 1 year TAVR follow up. She has an office visit with Dr. Stanford Breed later this week. 1 year echo shows EF 60-65%, normally functioning TAVR with mean gradient of 11 mm Hg and no PVL. She is doing quite well with NYHA class I symptoms. She can do most of her daily activities without any dyspnea. Gets short of breath when wheeling her son (over 200 lbs) up a hill. ECHO and KCCQ copied below.   Echo 07/20/19 IMPRESSIONS  1. Left ventricular ejection fraction, by visual estimation, is 60 to 65%. The left ventricle has normal function. Normal left ventricular size. There is no left ventricular hypertrophy.  2. Elevated left atrial and left ventricular end-diastolic pressures.  3. Left ventricular diastolic Doppler parameters are consistent with pseudonormalization pattern of LV diastolic filling.  4. Global right ventricle has normal systolic function.The right ventricular size is normal. No increase in right ventricular wall thickness.  5. Left atrial size was mildly dilated.  6. Right atrial size was normal.  7. Moderate mitral annular calcification.. No evidence of mitral valve regurgitation. No evidence of mitral stenosis.  8. The tricuspid valve is normal in structure. Tricuspid valve regurgitation was not visualized by color flow Doppler.  9. Stable 84mm Edwards Sapien bioprosthetic, stented aortic valve (TAVR) valve is present in the aortic position. Aortic valve mean gradient measures 11.0 mmHg. Aortic valve peak gradient measures 17.3 mmHg. Aortic valve area, by VTI measures 1.95 cm. 10. The pulmonic valve was normal in structure. Pulmonic valve regurgitation is not visualized by color flow Doppler. 11. The inferior vena cava is normal in size with greater than 50% respiratory variability, suggesting right atrial pressure of 3 mmHg.  ______________  [facility] Cardiomyopathy Questionnaire  KCCQ-12  07/21/2019  1 a. Ability to shower/bathe Not at all limited  1 b. Ability to walk 1 block Not at all limited  1 c. Ability to hurry/jog Other, Did not do  2. Edema feet/ankles/legs Never over the past 2 weeks  3. Limited by fatigue Less than once a week  4. Limited by dyspnea Less than once a week  5. Sitting up / on 3+ pillows Never over the past 2 weeks  6. Limited enjoyment of life Not limited at all  7. Rest of life w/ symptoms Completely satisfied  8 a. Participation in hobbies N/A, did not do for other reasons  8 b. Participation in chores Slightly limited  8 c. Visiting family/friends Did not limit at all     Angelena Form PA-C  [facility]

## 2019-07-24 ENCOUNTER — Encounter: Payer: Self-pay | Admitting: Cardiology

## 2019-07-24 ENCOUNTER — Other Ambulatory Visit (HOSPITAL_COMMUNITY): Payer: Self-pay | Admitting: Cardiology

## 2019-07-24 ENCOUNTER — Ambulatory Visit (INDEPENDENT_AMBULATORY_CARE_PROVIDER_SITE_OTHER): Payer: Medicare Other | Admitting: Cardiology

## 2019-07-24 ENCOUNTER — Other Ambulatory Visit: Payer: Self-pay | Admitting: *Deleted

## 2019-07-24 ENCOUNTER — Ambulatory Visit (HOSPITAL_COMMUNITY)
Admission: RE | Admit: 2019-07-24 | Discharge: 2019-07-24 | Disposition: A | Discharge status: Home / Self Care | Payer: Medicare Other | Source: Ambulatory Visit | Attending: Cardiovascular Disease | Admitting: Cardiovascular Disease

## 2019-07-24 ENCOUNTER — Other Ambulatory Visit: Payer: Self-pay

## 2019-07-24 VITALS — BP 144/84 | HR 60 | Temp 96.8°F | Ht 60.0 in | Wt 198.0 lb

## 2019-07-24 DIAGNOSIS — Z952 Presence of prosthetic heart valve: Secondary | ICD-10-CM

## 2019-07-24 DIAGNOSIS — I679 Cerebrovascular disease, unspecified: Secondary | ICD-10-CM

## 2019-07-24 DIAGNOSIS — I10 Essential (primary) hypertension: Secondary | ICD-10-CM

## 2019-07-24 DIAGNOSIS — I6523 Occlusion and stenosis of bilateral carotid arteries: Secondary | ICD-10-CM

## 2019-07-24 NOTE — Patient Instructions (Signed)
Medication Instructions:  NO CHANGE *If you need a refill on your cardiac medications before your next appointment, please call your pharmacy*  Lab Work: If you have labs (blood work) drawn today and your tests are completely normal, you will receive your results only by: . MyChart Message (if you have MyChart) OR . A paper copy in the mail If you have any lab test that is abnormal or we need to change your treatment, we will call you to review the results.  Follow-Up: At [facility], you and your health needs are our priority.  As part of our continuing mission to provide you with exceptional heart care, we have created designated Provider Care Teams.  These Care Teams include your primary Cardiologist (physician) and Advanced Practice Providers (APPs -  Physician Assistants and Nurse Practitioners) who all work together to provide you with the care you need, when you need it.  Your next appointment:   12 months  The format for your next appointment:   In Person  Provider:   Brian Crenshaw, MD   

## 2019-07-27 ENCOUNTER — Other Ambulatory Visit (INDEPENDENT_AMBULATORY_CARE_PROVIDER_SITE_OTHER): Payer: Medicare Other

## 2019-07-27 ENCOUNTER — Other Ambulatory Visit: Payer: Self-pay

## 2019-07-27 DIAGNOSIS — R809 Proteinuria, unspecified: Secondary | ICD-10-CM | POA: Diagnosis not present

## 2019-07-27 DIAGNOSIS — E1129 Type 2 diabetes mellitus with other diabetic kidney complication: Secondary | ICD-10-CM | POA: Diagnosis not present

## 2019-07-27 DIAGNOSIS — E1165 Type 2 diabetes mellitus with hyperglycemia: Secondary | ICD-10-CM

## 2019-07-27 LAB — BASIC METABOLIC PANEL WITH GFR
BUN: 17 mg/dL (ref 6–23)
CO2: 30 meq/L (ref 19–32)
Calcium: 9.4 mg/dL (ref 8.4–10.5)
Chloride: 102 meq/L (ref 96–112)
Creatinine, Ser: 1.1 mg/dL (ref 0.40–1.20)
GFR: 48.67 mL/min — ABNORMAL LOW
Glucose, Bld: 133 mg/dL — ABNORMAL HIGH (ref 70–99)
Potassium: 4.6 meq/L (ref 3.5–5.1)
Sodium: 139 meq/L (ref 135–145)

## 2019-07-27 LAB — MICROALBUMIN / CREATININE URINE RATIO
Creatinine,U: 49 mg/dL
Microalb Creat Ratio: 44.7 mg/g — ABNORMAL HIGH (ref 0.0–30.0)
Microalb, Ur: 21.9 mg/dL — ABNORMAL HIGH (ref 0.0–1.9)

## 2019-07-28 LAB — FRUCTOSAMINE: Fructosamine: 255 umol/L (ref 0–285)

## 2019-07-29 ENCOUNTER — Ambulatory Visit: Payer: Medicare Other | Admitting: Endocrinology

## 2019-08-04 ENCOUNTER — Other Ambulatory Visit: Payer: Self-pay

## 2019-08-06 ENCOUNTER — Ambulatory Visit (INDEPENDENT_AMBULATORY_CARE_PROVIDER_SITE_OTHER): Payer: Medicare Other | Admitting: Endocrinology

## 2019-08-06 ENCOUNTER — Encounter: Payer: Self-pay | Admitting: Endocrinology

## 2019-08-06 VITALS — BP 142/72 | HR 65 | Ht 60.0 in | Wt 198.4 lb

## 2019-08-06 DIAGNOSIS — E1129 Type 2 diabetes mellitus with other diabetic kidney complication: Secondary | ICD-10-CM | POA: Diagnosis not present

## 2019-08-06 DIAGNOSIS — R809 Proteinuria, unspecified: Secondary | ICD-10-CM | POA: Diagnosis not present

## 2019-08-06 DIAGNOSIS — E1165 Type 2 diabetes mellitus with hyperglycemia: Secondary | ICD-10-CM | POA: Diagnosis not present

## 2019-08-06 MED ORDER — LOSARTAN POTASSIUM 25 MG PO TABS: 25.0000 mg | ORAL_TABLET | Freq: Every day | ORAL | 2 refills | Status: DC

## 2019-08-06 NOTE — Patient Instructions (Signed)
Check blood sugars on waking up 3-4 days a week  Also check blood sugars about 2 hours after meals and do this after different meals by rotation  Recommended blood sugar levels on waking up are 90-130 and about 2 hours after meal is 130-160  Please bring your blood sugar monitor to each visit, thank you  Stop Glimeperide   Losartan is new

## 2019-08-06 NOTE — Progress Notes (Signed)
Patient ID: Shannon Kidd, female   DOB: 09-11-1946, 73 y.o.   MRN: [id number]           Reason for Appointment: Followup     Referring physician: Leighton Ruff  History of Present Illness:          Diagnosis: Type 2 diabetes mellitus, date of diagnosis: 2000        Past history: She used Metformin for her diabetes for several years until early 2015 and was taking 1g bid before it was stopped. She thinks her blood sugars are well controlled with this. Also at some point she was given Actos but this was stopped because of edema. Metformin was stopped in early 2015 on the suggestion of a nephrologist because of her renal dysfunction Since early 2015 she had been taking Januvia 50 mg daily When her blood sugars were not well controlled with A1c of 8% in 2015 she was started on low dose metformin to control fasting hyperglycemia  Recent history:   Oral hypoglycemic drugs the patient is taking are: Rybelsus 3 mg daily, Amaryl 0.5 mg at bedtime, 1000 mg metformin ER at bedtime    Fructosamine is 255  Current management, blood sugar patterns and problems identified:  Her A1c has had gone up to 7.9, previously 7.3  Also she was having difficulty watching her diet with variable fasting blood sugars and resulting from eating sweets and more carbohydrate  Not clear if she had gained weight  However with a trial of RYBELSUS 3 mg daily her blood sugars appear to be better  She has no side effects from this  She thinks that she is eating somewhat smaller portions also and may be benefiting from the Rybelsus  However does not appear to have lost any weight in the office  Blood sugars are excellent with only 1 relatively high reading after breakfast  Also no hypoglycemia with low-dose Amaryl  She thinks her weight at home was 190 today  Side effects from medications have been: Diarrhea from high-dose or regular metformin  Glucose monitoring:  done 1 or less times a day          Glucometer: Verio      Blood Glucose readings by time of day and averages from meter   PRE-MEAL Fasting Lunch Dinner Bedtime Overall  Glucose range:  108-149  125  95  83   Mean/median:  126     122   POST-MEAL PC Breakfast PC Lunch PC Dinner  Glucose range:  122, 186  154  91, 116  Mean/median:      Previous readings:   PRE-MEAL Fasting  1-3 PM Dinner Bedtime Overall  Glucose range:  123-223  132-230  112, 113    Mean/median:  175     158     Self-care:      Meals:  2-3 meals per day.  Breakfast is a egg/toast         Dietician visit: Most recent: 8/15.               Weight history: 195-245 previously  Wt Readings from Last 3 Encounters:  08/06/19 198 lb 6.4 oz (90 kg)  07/24/19 198 lb (89.8 kg)  12/15/18 199 lb (90.3 kg)    Lab Results  Component Value Date   HGBA1C 7.9 (H) 05/22/2019   HGBA1C 7.3 (H) 01/27/2019   HGBA1C 7.3 (H) 08/14/2018   Lab Results  Component Value Date   MICROALBUR 21.9 (H) 07/27/2019  [place] 24 05/28/2017   CREATININE 1.10 07/27/2019    Allergies as of 08/06/2019      Reactions   Actos [pioglitazone] Swelling      Medication List       Accurate as of August 06, 2019  3:51 PM. If you have any questions, ask your nurse or doctor.        albuterol 108 (90 Base) MCG/ACT inhaler Commonly known as: VENTOLIN HFA Inhale 2 puffs into the lungs every 6 (six) hours as needed for wheezing or shortness of breath.   amLODipine 5 MG tablet Commonly known as: NORVASC Take 1 tablet (5 mg total) by mouth daily.   aspirin 81 MG tablet Take 81 mg by mouth daily.   glimepiride 1 MG tablet Commonly known as: AMARYL Take 1 tablet (1 mg total) by mouth at bedtime. What changed: how much to take   metFORMIN 500 MG 24 hr tablet Commonly known as: GLUCOPHAGE-XR TAKE 2 TABLETS BY MOUTH ONCE DAILY WITH SUPPER   nystatin cream Commonly known as: MYCOSTATIN Apply topically 2 (two) times daily. What changed:   when to take this   reasons to take this   OneTouch Delica Lancets 82M Misc USE TO CHECK BLOOD SUGAR ONCE DAILY Dx code E11.9   OneTouch Verio IQ System w/Device Kit USE TO CHECK BLOOD SUGAR ONCE DAILY Dx code E11.9   OneTouch Verio test strip Generic drug: glucose blood USE 1 STRIP TO CHECK GLUCOSE ONCE DAILY   pramipexole 0.25 MG tablet Commonly known as: MIRAPEX Take 0.25 mg by mouth at bedtime.   rosuvastatin 20 MG tablet Commonly known as: CRESTOR Take 1 tablet (20 mg total) by mouth daily.   Rybelsus 3 MG Tabs Generic drug: Semaglutide Take 3 mg by mouth daily before breakfast.   sertraline 25 MG tablet Commonly known as: ZOLOFT Take 25 mg by mouth at bedtime.   vitamin B-12 1000 MCG tablet Commonly known as: CYANOCOBALAMIN Take 1,000 mcg by mouth daily.   vitamin C 1000 MG tablet Take 1,000 mg by mouth daily.   Vitamin D 50 MCG (2000 UT) tablet Take 2,000 Units by mouth daily.       Allergies:  Allergies  Allergen Reactions  . Actos [Pioglitazone] Swelling    Past Medical History:  Diagnosis Date  . Anxiety   . Cervical myelopathy (HCC)    s/p cervical fusion @ [facility]  . Chronic diastolic congestive heart failure ([place])   . CKD (chronic kidney disease), stage III   . Diabetes mellitus ([place])   . Glaucoma   . HTN (hypertension)   . Hyperlipemia   . Morbid obesity ([place])   . Rheumatic fever   . S/P TAVR (transcatheter aortic valve replacement) 07/29/2018   26 mm Edwards Sapien 3 transcatheter heart valve placed via percutaneous right transfemoral approach   . Severe aortic stenosis     Past Surgical History:  Procedure Laterality Date  . APPENDECTOMY    . CARDIAC CATHETERIZATION    . EYE SURGERY Bilateral    cataract extraction bilateral with lens  . INTRAOPERATIVE TRANSTHORACIC ECHOCARDIOGRAM  07/29/2018   Procedure: INTRAOPERATIVE TRANSTHORACIC ECHOCARDIOGRAM;  Surgeon: Sherren Mocha, MD;  Location: [facility];  Service: Open Heart Surgery;;  . NECK SURGERY     . RIGHT/LEFT HEART CATH AND CORONARY ANGIOGRAPHY N/A 06/04/2018   Procedure: RIGHT/LEFT HEART CATH AND CORONARY ANGIOGRAPHY;  Surgeon: Sherren Mocha, MD;  Location: [place] [facility];  Service: Cardiovascular;  Laterality: N/A;  . TONSILLECTOMY    .  TRANSCATHETER AORTIC VALVE REPLACEMENT, TRANSFEMORAL N/A 07/29/2018   Procedure: TRANSCATHETER AORTIC VALVE REPLACEMENT, TRANSFEMORAL;  Surgeon: Sherren Mocha, MD;  Location: [facility];  Service: Open Heart Surgery;  Laterality: N/A;  . TUBAL LIGATION    . VAGINAL HYSTERECTOMY      Family History  Problem Relation Age of Onset  . Diabetes Mother   . Cancer Mother        Colon  . Diabetes Sister   . Diabetes Paternal Grandmother   . Breast cancer Maternal Grandmother 30    Social History:  reports that she has never smoked. She has never used smokeless tobacco. She reports that she does not drink alcohol or use drugs.    Review of Systems       Lipids: She has been treated with Crestor 20 mg by cardiologist HDL tends to be low Last LDL done by PCP was 23 done on 07/09/2019       Lab Results  Component Value Date   CHOL 78 05/28/2017   HDL 32.30 (L) 05/28/2017   LDLCALC 24 05/28/2017   TRIG 109.0 05/28/2017   CHOLHDL 2 05/28/2017                  The blood pressure has been controlled with Norvasc 5 mg daily prescribed by her cardiologist Not monitoring at home  BP Readings from Last 3 Encounters:  08/06/19 (!) 142/72  07/24/19 (!) 144/84  12/15/18 134/70        Diabetic foot exam done in 12/2017 with normal findings   RENAL dysfunction: Although her creatinine had gone up to 1.53 on  a previous visit it is now progressively improved  Also has had mild microalbuminuria persistently, not on ACE inhibitor or ARB   Lab Results  Component Value Date   CREATININE 1.10 07/27/2019   CREATININE 1.14 05/22/2019   CREATININE 1.21 (H) 01/27/2019    She is on vitamin D supplements which is followed by PCP   LABS:  No  visits with results within 1 Week(s) from this visit.  Latest known visit with results is:  Lab on 07/27/2019  Component Date Value Ref Range Status  . Microalb, Ur 07/27/2019 21.9* 0.0 - 1.9 mg/dL Final  . Creatinine,U 07/27/2019 49.0  mg/dL Final  . Microalb Creat Ratio 07/27/2019 44.7* 0.0 - 30.0 mg/g Final  . Fructosamine 07/27/2019 255  0 - 285 umol/L Final   Comment: Published reference interval for apparently healthy subjects between age 63 and 19 is 34 - 285 umol/L and in a poorly controlled diabetic population is 228 - 563 umol/L with a mean of 396 umol/L.   Marland Kitchen Sodium 07/27/2019 139  135 - 145 mEq/L Final  . Potassium 07/27/2019 4.6  3.5 - 5.1 mEq/L Final  . Chloride 07/27/2019 102  96 - 112 mEq/L Final  . CO2 07/27/2019 30  19 - 32 mEq/L Final  . Glucose, Bld 07/27/2019 133* 70 - 99 mg/dL Final  . BUN 07/27/2019 17  6 - 23 mg/dL Final  . Creatinine, Ser 07/27/2019 1.10  0.40 - 1.20 mg/dL Final  . Calcium 07/27/2019 9.4  8.4 - 10.5 mg/dL Final  . GFR 07/27/2019 48.67* >60.00 mL/min Final    Physical Examination:  BP (!) 142/72 (BP Location: Left Arm, Patient Position: Sitting, Cuff Size: Normal)   Pulse 65   Ht 5' (1.524 m)   Wt 198 lb 6.4 oz (90 kg)   SpO2 96%   BMI 38.75 kg/m  ASSESSMENT:  Diabetes type 2 with obesity, BMI 38  See history of present illness for discussion of current diabetes management, blood sugar patterns and problems identified  Her A1c was previously 7.9  With fructosamine of 255 she appears to have better control  She has benefited from adding Rybelsus 3 mg daily with more even and consistently good blood sugars although still slightly high in the morning She is now able to be more consistent with diet and watch portions However not clear if she has lost any weight She has limited activity level because of various reasons  RENAL dysfunction: Has been borderline and appears to be further improved  Also history of mild increase in  urine microalbumin Currently not on ARB or ACE   PLAN:  She will try to leave off her Amaryl to see if blood sugars are still about the same Consider increasing Rybelsus to 7 mg if she has difficulty losing weight or if blood sugars are higher More readings after meals To try and increase activity as tolerated  Start losartan 25 mg daily for microalbuminuria and high normal blood pressure  There are no Patient Instructions on file for this visit.   Elayne Snare 08/06/2019, 3:51 PM   Note: This office note was prepared with Dragon voice recognition system technology. Any transcriptional errors that result from this process are unintentional.

## 2019-08-24 ENCOUNTER — Other Ambulatory Visit: Payer: Self-pay

## 2019-08-24 ENCOUNTER — Telehealth: Payer: Self-pay

## 2019-08-24 MED ORDER — RYBELSUS 3 MG PO TABS: 3.0000 mg | ORAL_TABLET | Freq: Every day | ORAL | 0 refills | Status: DC

## 2019-08-24 NOTE — Telephone Encounter (Signed)
Rx sent 

## 2019-08-24 NOTE — Telephone Encounter (Signed)
MEDICATION: Semaglutide (RYBELSUS) 3 MG TABS  PHARMACY:  [facility] - [place], [place] - [address]  IS THIS A 90 DAY SUPPLY : yes  IS PATIENT OUT OF MEDICATION: no  IF NOT; HOW MUCH IS LEFT:   LAST APPOINTMENT DATE: @10 /29/2020  NEXT APPOINTMENT DATE:@1 /04/2020  DO WE HAVE YOUR PERMISSION TO LEAVE A DETAILED MESSAGE:  OTHER COMMENTS:    **Let patient know to contact pharmacy at the end of the day to make sure medication is ready. **  ** Please notify patient to allow 48-72 hours to process**  **Encourage patient to contact the pharmacy for refills or they can request refills through [facility]**

## 2019-08-25 ENCOUNTER — Other Ambulatory Visit: Payer: Self-pay | Admitting: Endocrinology

## 2019-10-15 ENCOUNTER — Other Ambulatory Visit: Payer: Medicare Other

## 2019-10-19 ENCOUNTER — Other Ambulatory Visit: Payer: Self-pay | Admitting: Endocrinology

## 2019-10-20 ENCOUNTER — Ambulatory Visit: Payer: Medicare Other | Admitting: Endocrinology

## 2019-10-28 ENCOUNTER — Other Ambulatory Visit: Payer: Self-pay | Admitting: Endocrinology

## 2019-11-12 ENCOUNTER — Other Ambulatory Visit: Payer: Self-pay

## 2019-11-12 ENCOUNTER — Other Ambulatory Visit (INDEPENDENT_AMBULATORY_CARE_PROVIDER_SITE_OTHER): Payer: Medicare Other

## 2019-11-12 DIAGNOSIS — E1129 Type 2 diabetes mellitus with other diabetic kidney complication: Secondary | ICD-10-CM | POA: Diagnosis not present

## 2019-11-12 DIAGNOSIS — E1165 Type 2 diabetes mellitus with hyperglycemia: Secondary | ICD-10-CM | POA: Diagnosis not present

## 2019-11-12 DIAGNOSIS — R809 Proteinuria, unspecified: Secondary | ICD-10-CM | POA: Diagnosis not present

## 2019-11-12 LAB — MICROALBUMIN / CREATININE URINE RATIO
Creatinine,U: 81.2 mg/dL
Microalb Creat Ratio: 25.3 mg/g (ref 0.0–30.0)
Microalb, Ur: 20.6 mg/dL — ABNORMAL HIGH (ref 0.0–1.9)

## 2019-11-12 LAB — URINALYSIS, ROUTINE W REFLEX MICROSCOPIC
Bilirubin Urine: NEGATIVE
Ketones, ur: NEGATIVE
Nitrite: POSITIVE — AB
Specific Gravity, Urine: 1.025 (ref 1.000–1.030)
Total Protein, Urine: 30 — AB
Urine Glucose: NEGATIVE
Urobilinogen, UA: 0.2 (ref 0.0–1.0)
pH: 5.5 (ref 5.0–8.0)

## 2019-11-12 LAB — COMPREHENSIVE METABOLIC PANEL WITH GFR
ALT: 12 U/L (ref 0–35)
AST: 17 U/L (ref 0–37)
Albumin: 3.8 g/dL (ref 3.5–5.2)
Alkaline Phosphatase: 73 U/L (ref 39–117)
BUN: 28 mg/dL — ABNORMAL HIGH (ref 6–23)
CO2: 27 meq/L (ref 19–32)
Calcium: 9.2 mg/dL (ref 8.4–10.5)
Chloride: 105 meq/L (ref 96–112)
Creatinine, Ser: 1.13 mg/dL (ref 0.40–1.20)
GFR: 47.14 mL/min — ABNORMAL LOW
Glucose, Bld: 113 mg/dL — ABNORMAL HIGH (ref 70–99)
Potassium: 5 meq/L (ref 3.5–5.1)
Sodium: 138 meq/L (ref 135–145)
Total Bilirubin: 0.3 mg/dL (ref 0.2–1.2)
Total Protein: 6.9 g/dL (ref 6.0–8.3)

## 2019-11-12 LAB — HEMOGLOBIN A1C: Hgb A1c MFr Bld: 7.8 % — ABNORMAL HIGH (ref 4.6–6.5)

## 2019-11-12 LAB — TSH: TSH: 1.23 u[IU]/mL (ref 0.35–4.50)

## 2019-11-16 ENCOUNTER — Other Ambulatory Visit: Payer: Self-pay

## 2019-11-17 ENCOUNTER — Ambulatory Visit (INDEPENDENT_AMBULATORY_CARE_PROVIDER_SITE_OTHER): Payer: Medicare Other | Admitting: Endocrinology

## 2019-11-17 ENCOUNTER — Encounter: Payer: Self-pay | Admitting: Endocrinology

## 2019-11-17 VITALS — BP 142/62 | HR 61 | Ht 60.0 in | Wt 197.6 lb

## 2019-11-17 DIAGNOSIS — I10 Essential (primary) hypertension: Secondary | ICD-10-CM

## 2019-11-17 DIAGNOSIS — E1165 Type 2 diabetes mellitus with hyperglycemia: Secondary | ICD-10-CM | POA: Diagnosis not present

## 2019-11-17 DIAGNOSIS — E1129 Type 2 diabetes mellitus with other diabetic kidney complication: Secondary | ICD-10-CM | POA: Diagnosis not present

## 2019-11-17 DIAGNOSIS — R809 Proteinuria, unspecified: Secondary | ICD-10-CM | POA: Diagnosis not present

## 2019-11-17 NOTE — Progress Notes (Signed)
Patient ID: Shannon Kidd, female   DOB: [birth year], 74 y.o.   MRN: [id number]           Reason for Appointment: Followup     Referring physician: Leighton Ruff  History of Present Illness:          Diagnosis: Type 2 diabetes mellitus, date of diagnosis: 2000        Past history: She used Metformin for her diabetes for several years until early 2015 and was taking 1g bid before it was stopped. She thinks her blood sugars are well controlled with this. Also at some point she was given Actos but this was stopped because of edema. Metformin was stopped in early 2015 on the suggestion of a nephrologist because of her renal dysfunction Since early 2015 she had been taking Januvia 50 mg daily When her blood sugars were not well controlled with A1c of 8% in 2015 she was started on low dose metformin to control fasting hyperglycemia  Recent history:   Oral hypoglycemic drugs the patient is taking are: Amaryl 1 mg at bedtime, 1000 mg metformin ER at bedtime    Fructosamine was 255  Current management, blood sugar patterns and problems identified:  Her A1c had gone up to 7.9, now 7.8   She was last seen in 10/20  She was started on Rybelsus 3 mg in 05/2019 because of difficulty controlling her sugars and not controlling portions and carbohydrates as well as sweets  She was able to cut back on her portions Rybelsus and her sugars were improving  Sugars were as low as 83 at bedtime and she was told to stop her Amaryl in October 2020  More recently she could not get her Rybelsus filled because of a higher co-pay in January  With this she thinks her blood sugars recently have been higher fasting around 160  She has started back on Amaryl 1 mg daily in the evenings again  With this in the last few days her blood sugars have been improving, lab glucose 113 fasting  Her weight appears to be the same  She thinks that she is able to control her portions and sweets now   Side effects  from medications have been: Diarrhea from high-dose or regular metformin  Glucose monitoring:  done 1 or less times a day         Glucometer: Verio      Blood Glucose readings by recall:   PRE-MEAL Fasting Lunch Dinner Bedtime Overall  Glucose range: 99, 120      Mean/median:        POST-MEAL PC Breakfast PC Lunch PC Dinner  Glucose range:   114  Mean/median:      Previous readings:   PRE-MEAL Fasting Lunch Dinner Bedtime Overall  Glucose range:  108-149  125  95  83   Mean/median:  126     122   POST-MEAL PC Breakfast PC Lunch PC Dinner  Glucose range:  122, 186  154  91, 116  Mean/median:       Self-care:      Meals:  2-3 meals per day.  Breakfast is a egg/toast      Dietician visit: Most recent: 8/15.               Weight history: 195-245 previously  Wt Readings from Last 3 Encounters:  11/17/19 197 lb 9.6 oz (89.6 kg)  08/06/19 198 lb 6.4 oz (90 kg)  07/24/19 198 lb (89.8 kg)  Lab Results  Component Value Date   HGBA1C 7.8 (H) 11/12/2019   HGBA1C 7.9 (H) 05/22/2019   HGBA1C 7.3 (H) 01/27/2019   Lab Results  Component Value Date   MICROALBUR 20.6 (H) 11/12/2019   LDLCALC 24 05/28/2017   CREATININE 1.13 11/12/2019    Allergies as of 11/17/2019      Reactions   Actos [pioglitazone] Swelling      Medication List       Accurate as of November 17, 2019 10:46 AM. If you have any questions, ask your nurse or doctor.        albuterol 108 (90 Base) MCG/ACT inhaler Commonly known as: VENTOLIN HFA Inhale 2 puffs into the lungs every 6 (six) hours as needed for wheezing or shortness of breath.   amLODipine 5 MG tablet Commonly known as: NORVASC Take 1 tablet (5 mg total) by mouth daily.   aspirin 81 MG tablet Take 81 mg by mouth daily.   glimepiride 1 MG tablet Commonly known as: AMARYL Take 1 tablet (1 mg total) by mouth at bedtime. What changed: how much to take   losartan 25 MG tablet Commonly known as: COZAAR TAKE ONE TABLET BY MOUTH EVERY  MORNING   metFORMIN 500 MG 24 hr tablet Commonly known as: GLUCOPHAGE-XR TAKE 2 TABLETS BY MOUTH ONCE DAILY WITH SUPPER   nystatin cream Commonly known as: MYCOSTATIN Apply topically 2 (two) times daily. What changed:   when to take this  reasons to take this   OneTouch Delica Lancets 64W Misc USE TO CHECK BLOOD SUGAR ONCE DAILY Dx code E11.9   OneTouch Verio IQ System w/Device Kit USE TO CHECK BLOOD SUGAR ONCE DAILY Dx code E11.9   OneTouch Verio test strip Generic drug: glucose blood USE 1 STRIP TO CHECK GLUCOSE ONCE DAILY   pramipexole 0.25 MG tablet Commonly known as: MIRAPEX Take 0.25 mg by mouth at bedtime.   rosuvastatin 20 MG tablet Commonly known as: CRESTOR Take 1 tablet (20 mg total) by mouth daily.   Rybelsus 3 MG Tabs Generic drug: Semaglutide Take 3 mg by mouth daily before breakfast.   sertraline 50 MG tablet Commonly known as: ZOLOFT Take 50 mg by mouth daily. What changed: Another medication with the same name was removed. Continue taking this medication, and follow the directions you see here. Changed by: Elayne Snare, MD   vitamin B-12 1000 MCG tablet Commonly known as: CYANOCOBALAMIN Take 1,000 mcg by mouth daily.   vitamin C 1000 MG tablet Take 1,000 mg by mouth daily.   Vitamin D 50 MCG (2000 UT) tablet Take 2,000 Units by mouth daily.       Allergies:  Allergies  Allergen Reactions  . Actos [Pioglitazone] Swelling    Past Medical History:  Diagnosis Date  . Anxiety   . Cervical myelopathy (HCC)    s/p cervical fusion @ [facility]  . Chronic diastolic congestive heart failure ([place])   . CKD (chronic kidney disease), stage III   . Diabetes mellitus ([place])   . Glaucoma   . HTN (hypertension)   . Hyperlipemia   . Morbid obesity ([place])   . Rheumatic fever   . S/P TAVR (transcatheter aortic valve replacement) 07/29/2018   26 mm Edwards Sapien 3 transcatheter heart valve placed via percutaneous right transfemoral approach   .  Severe aortic stenosis     Past Surgical History:  Procedure Laterality Date  . APPENDECTOMY    . CARDIAC CATHETERIZATION    . EYE SURGERY Bilateral  cataract extraction bilateral with lens  . INTRAOPERATIVE TRANSTHORACIC ECHOCARDIOGRAM  07/29/2018   Procedure: INTRAOPERATIVE TRANSTHORACIC ECHOCARDIOGRAM;  Surgeon: Sherren Mocha, MD;  Location: [place];  Service: Open Heart Surgery;;  . NECK SURGERY    . RIGHT/LEFT HEART CATH AND CORONARY ANGIOGRAPHY N/A 06/04/2018   Procedure: RIGHT/LEFT HEART CATH AND CORONARY ANGIOGRAPHY;  Surgeon: Sherren Mocha, MD;  Location: [place] [facility];  Service: Cardiovascular;  Laterality: N/A;  . TONSILLECTOMY    . TRANSCATHETER AORTIC VALVE REPLACEMENT, TRANSFEMORAL N/A 07/29/2018   Procedure: TRANSCATHETER AORTIC VALVE REPLACEMENT, TRANSFEMORAL;  Surgeon: Sherren Mocha, MD;  Location: [place];  Service: Open Heart Surgery;  Laterality: N/A;  . TUBAL LIGATION    . VAGINAL HYSTERECTOMY      Family History  Problem Relation Age of Onset  . Diabetes Mother   . Cancer Mother        Colon  . Diabetes Sister   . Diabetes Paternal Grandmother   . Breast cancer Maternal Grandmother 59    Social History:  reports that she has never smoked. She has never used smokeless tobacco. She reports that she does not drink alcohol or use drugs.    Review of Systems       Lipids: She has been treated with Crestor 20 mg by cardiologist HDL tends to be low Last LDL done by PCP was 23 done on 07/09/2019       Lab Results  Component Value Date   CHOL 78 05/28/2017   HDL 32.30 (L) 05/28/2017   LDLCALC 24 05/28/2017   TRIG 109.0 05/28/2017   CHOLHDL 2 05/28/2017                  The blood pressure has been treated with Norvasc 5 mg daily prescribed by her cardiologist Also now taking 25 mg losartan because of microalbuminuria  She is now monitoring at home Blood pressure readings at home: 130-160/70-79   BP Readings from Last 3 Encounters:  11/17/19  (!) 142/62  08/06/19 (!) 142/72  07/24/19 (!) 144/84        Diabetic foot exam done in 12/2017 with normal findings   RENAL dysfunction: Although her creatinine had gone up to 1.53 in the past it has been recently consistently normal  Also has had mild microalbuminuria persistently Now with starting losartan 25 mg daily her microalbumin is back to normal   Lab Results  Component Value Date   CREATININE 1.13 11/12/2019   CREATININE 1.10 07/27/2019   CREATININE 1.14 05/22/2019    She is on vitamin D supplements which is followed by PCP  She has a recent UTI treated by her urologist   LABS:  Lab on 11/12/2019  Component Date Value Ref Range Status  . Color, Urine 11/12/2019 YELLOW  Yellow;Lt. Yellow;Straw;Dark Yellow;Amber;Green;Red;Brown Final  . APPearance 11/12/2019 Cloudy* Clear;Turbid;Slightly Cloudy;Cloudy Final  . Specific Gravity, Urine 11/12/2019 1.025  1.000 - 1.030 Final  . pH 11/12/2019 5.5  5.0 - 8.0 Final  . Total Protein, Urine 11/12/2019 30* Negative Final  . Urine Glucose 11/12/2019 NEGATIVE  Negative Final  . Ketones, ur 11/12/2019 NEGATIVE  Negative Final  . Bilirubin Urine 11/12/2019 NEGATIVE  Negative Final  . Hgb urine dipstick 11/12/2019 TRACE-INTACT* Negative Final  . Urobilinogen, UA 11/12/2019 0.2  0.0 - 1.0 Final  . Leukocytes,Ua 11/12/2019 LARGE* Negative Final  . Nitrite 11/12/2019 POSITIVE* Negative Final  . WBC, UA 11/12/2019 TNTC(>50/hpf)* 0-2/hpf Final  . RBC / HPF 11/12/2019 0-2/hpf  0-2/hpf Final  .  Mucus, UA 11/12/2019 Presence of* None Final  . Squamous Epithelial / LPF 11/12/2019 Rare(0-4/hpf)  Rare(0-4/hpf) Final  . Bacteria, UA 11/12/2019 Many(>50/hpf)* None Final  . Microalb, Ur 11/12/2019 20.6* 0.0 - 1.9 mg/dL Final  . Creatinine,U 11/12/2019 81.2  mg/dL Final  . Microalb Creat Ratio 11/12/2019 25.3  0.0 - 30.0 mg/g Final  . Sodium 11/12/2019 138  135 - 145 mEq/L Final  . Potassium 11/12/2019 5.0  3.5 - 5.1 mEq/L Final  .  Chloride 11/12/2019 105  96 - 112 mEq/L Final  . CO2 11/12/2019 27  19 - 32 mEq/L Final  . Glucose, Bld 11/12/2019 113* 70 - 99 mg/dL Final  . BUN 11/12/2019 28* 6 - 23 mg/dL Final  . Creatinine, Ser 11/12/2019 1.13  0.40 - 1.20 mg/dL Final  . Total Bilirubin 11/12/2019 0.3  0.2 - 1.2 mg/dL Final  . Alkaline Phosphatase 11/12/2019 73  39 - 117 U/L Final  . AST 11/12/2019 17  0 - 37 U/L Final  . ALT 11/12/2019 12  0 - 35 U/L Final  . Total Protein 11/12/2019 6.9  6.0 - 8.3 g/dL Final  . Albumin 11/12/2019 3.8  3.5 - 5.2 g/dL Final  . GFR 11/12/2019 47.14* >60.00 mL/min Final  . Calcium 11/12/2019 9.2  8.4 - 10.5 mg/dL Final  . Hgb A1c MFr Bld 11/12/2019 7.8* 4.6 - 6.5 % Final   Glycemic Control Guidelines for People with Diabetes:Non Diabetic:  <6%Goal of Therapy: <7%Additional Action Suggested:  >8%   . TSH 11/12/2019 1.23  0.35 - 4.50 uIU/mL Final    Physical Examination:  BP (!) 142/62 (BP Location: Left Arm, Patient Position: Sitting, Cuff Size: Large)   Pulse 61   Ht 5' (1.524 m)   Wt 197 lb 9.6 oz (89.6 kg)   SpO2 95%   BMI 38.59 kg/m     ASSESSMENT:  Diabetes type 2 with obesity, BMI 38  See history of present illness for discussion of current diabetes management, blood sugar patterns and problems identified  Her A1c is still high at 7.8  Although she had benefited from Rybelsus  She previously had benefited from adding Rybelsus 3 mg daily although was likely having higher readings with stopping Amaryl subsequently She cannot afford Rybelsus now With starting Amaryl 1 mg her blood sugars at home appear to be improving and this is has yet not reflected in her A1c She has not been able to exercise because of physical limitations Diet is apparently fairly good Weight is stable Also continues Metformin  RENAL dysfunction: Has been more normal recently and consistent, no increase with starting losartan  Also history of mild increase in urine microalbumin which is  now improving with losartan 25 mg and will continue  HYPERTENSION: Fair control although she thinks systolic reading has been as low as 130 at home   PLAN:  She will continue Amaryl 1 mg in the evenings However needs to check blood sugars after supper also and let us know if they are consistently higher or lower Most likely needs to take the Amaryl at bedtime since fasting readings tend to be higher She will try to be consistent with diet A1c again in 3 months  Continue losartan 25 mg daily for microalbuminuria and high normal blood pressure  There are no Patient Instructions on file for this visit.   Elayne Snare 11/17/2019, 10:46 AM   Note: This office note was prepared with Dragon voice recognition system technology. Any transcriptional errors that result from this  process are unintentional.

## 2019-11-20 ENCOUNTER — Other Ambulatory Visit: Payer: Self-pay | Admitting: Endocrinology

## 2019-11-22 ENCOUNTER — Ambulatory Visit: Payer: Medicare Other | Attending: Internal Medicine

## 2019-11-22 DIAGNOSIS — Z23 Encounter for immunization: Secondary | ICD-10-CM

## 2019-11-22 NOTE — Progress Notes (Signed)
   Covid-19 Vaccination Clinic  Name:  Shannon Kidd    MRN: [id number] DOB: 23-Jul-1946  11/22/2019  Shannon Kidd was observed post Covid-19 immunization for 15 minutes without incidence. She was provided with Vaccine Information Sheet and instruction to access the V-Safe system.   Shannon Kidd was instructed to call 911 with any severe reactions post vaccine: Marland Kitchen Difficulty breathing  . Swelling of your face and throat  . A fast heartbeat  . A bad rash all over your body  . Dizziness and weakness    Immunizations Administered    Name Date Dose VIS Date Route   Pfizer COVID-19 Vaccine 11/22/2019 10:46 AM 0.3 mL 09/18/2019 Intramuscular   Manufacturer: [employer], [employer]   Lot: [id number]   NDC: [address]-1

## 2019-12-17 IMAGING — CT CT ANGIO CHEST
1 of 15 series · 9 of 37 positions shown · IV contrast (APPLIED)
Comparison: None.

CLINICAL DATA: [facility]

EXAM:
CT CHEST, ABDOMEN AND PELVIS WITHOUT CONTRAST
TECHNIQUE: Multidetector CT imaging of the chest, abdomen and pelvis was
performed following the standard protocol without IV contrast.

[Series 8: 5-95% · axial · 0.39mm/px · z∈[+1259,+1464]mm · 9 of 4270 slices shown]
[im 427/4270  lung]
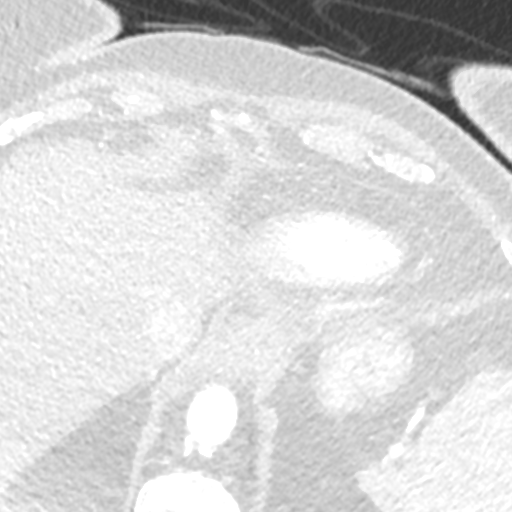
[im 854/4270  mediastinal]
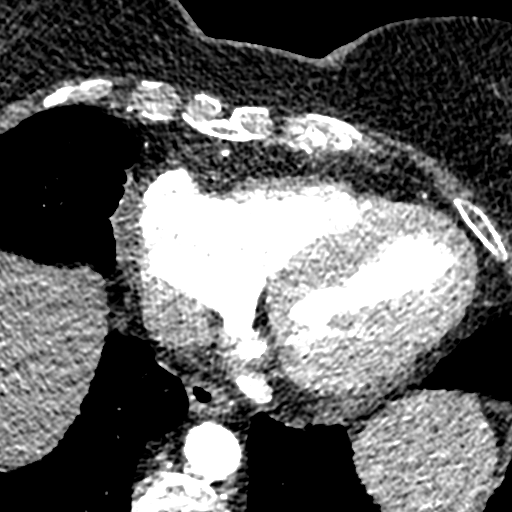
[im 1281/4270  lung]
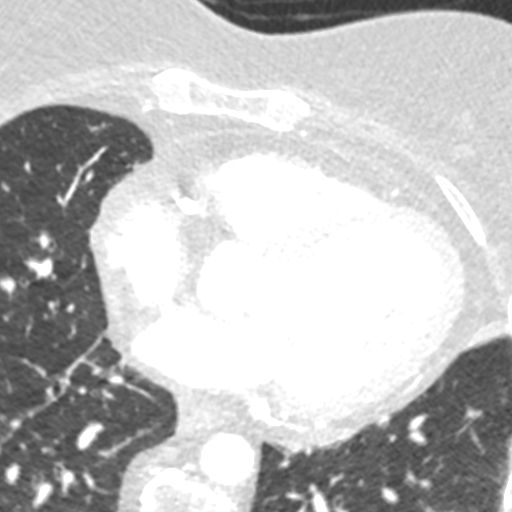
[im 1708/4270  mediastinal]
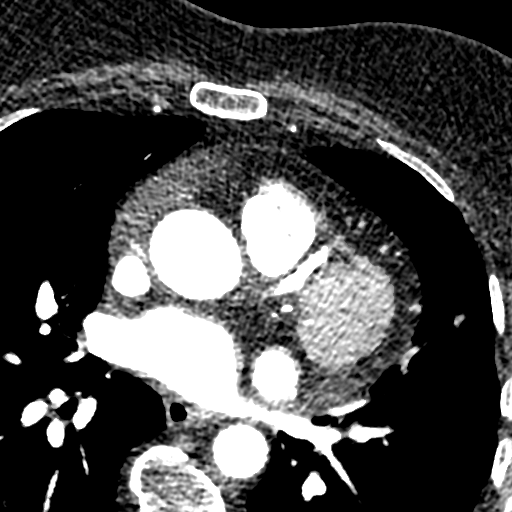
[im 2135/4270  lung]
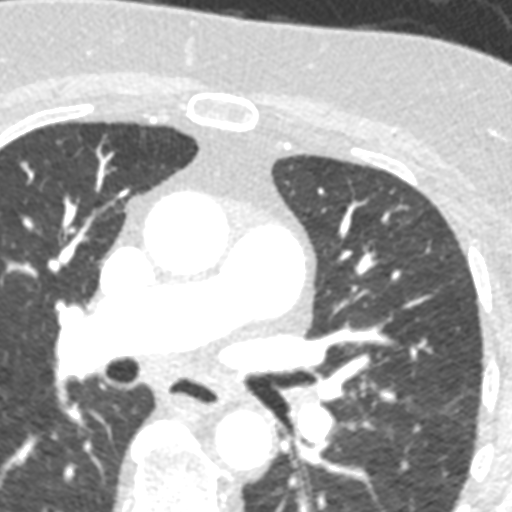
[im 2562/4270  mediastinal]
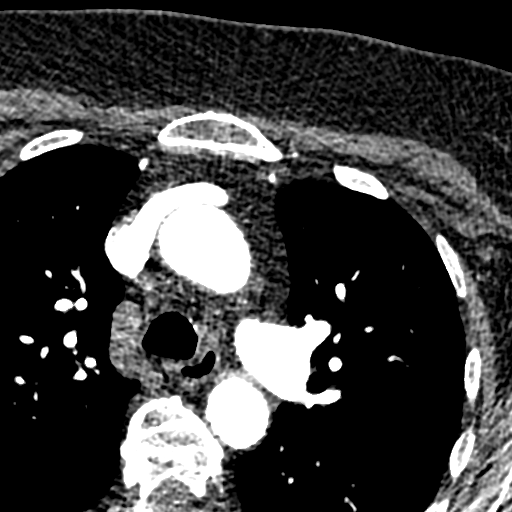
[im 2989/4270  lung]
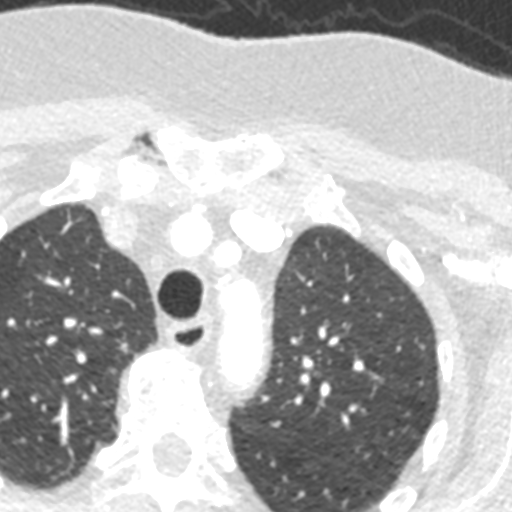
[im 3416/4270  mediastinal]
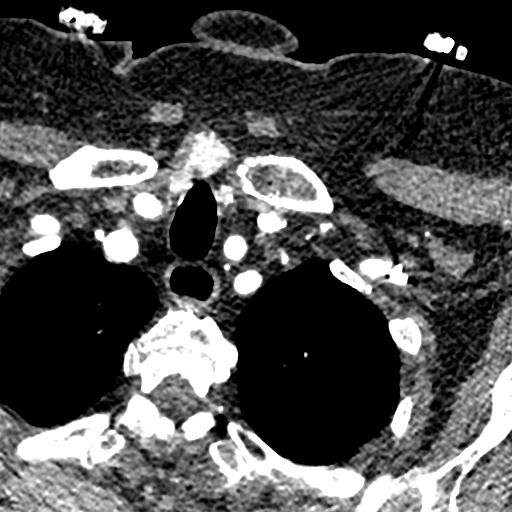
[im 3843/4270  lung]
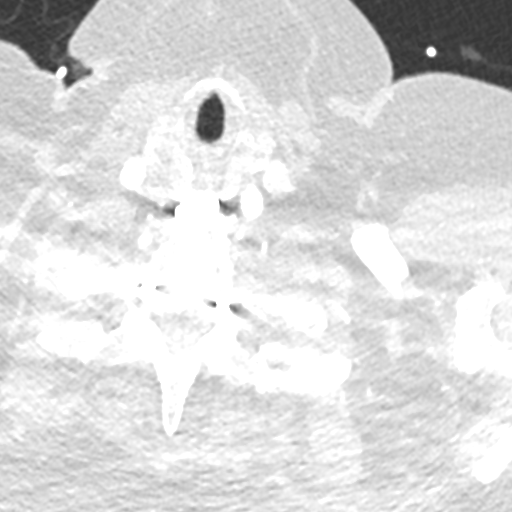

[9 of 37 positions shown; findings below may reference images not displayed]

FINDINGS: CTA CHEST FINDINGS

Cardiovascular: Heart size is normal. There is no significant
pericardial fluid, thickening or pericardial calcification. There is
aortic atherosclerosis, as well as atherosclerosis of the great
vessels of the mediastinum and the coronary arteries, including
calcified atherosclerotic plaque in the left main, left anterior
descending, left circumflex and right coronary arteries. Severe
thickening calcification of the aortic valve. Calcifications of the
mitral annulus.

Mediastinum/Lymph Nodes: No pathologically enlarged mediastinal or
hilar lymph nodes. Esophagus is unremarkable in appearance. No
axillary lymphadenopathy.

Lungs/Pleura: Scattered areas of linear scarring throughout the mid
to lower lungs bilaterally. No acute consolidative airspace disease.
No pleural effusions. No suspicious appearing pulmonary nodules or
masses are noted.

Musculoskeletal/Soft Tissues: There are no aggressive appearing
lytic or blastic lesions noted in the visualized portions of the
skeleton.

CTA ABDOMEN AND PELVIS FINDINGS

Hepatobiliary: No suspicious cystic or solid hepatic lesions. No
intra or extrahepatic biliary ductal dilatation. Multiple small
calcified gallstones lying dependently in the gallbladder. No
findings to suggest an acute cholecystitis at this time.

Pancreas: No pancreatic mass. No pancreatic ductal dilatation. No
pancreatic or peripancreatic fluid or inflammatory changes.

Spleen: Unremarkable.

Adrenals/Urinary Tract: Multiple subcentimeter low-attenuation
lesions in both kidneys, too small to characterize, but
statistically likely to represent cysts. Exophytic 1.2 cm simple
cyst in the anterior aspect of the interpolar region of the left
kidney. No suspicious renal lesions. Bilateral adrenal glands are
normal in appearance. No hydroureteronephrosis. Urinary bladder is
normal in appearance.

Stomach/Bowel: Normal appearance of the stomach. No pathologic
dilatation of small bowel or colon. A few scattered colonic
diverticulae are noted, particularly in the sigmoid colon, without
surrounding inflammatory changes to suggest an acute diverticulitis
at this time. Normal appendix.

Vascular/Lymphatic: Aortic atherosclerosis with vascular findings
and measurements pertinent to potential TAVR procedure, as detailed
below. No aneurysm or dissection noted in the abdominal or pelvic
vasculature. No lymphadenopathy noted in the abdomen or pelvis.

Reproductive: Status post hysterectomy. 4.6 x 2.7 x 3.7 cm low to
intermediate attenuation lesion in the left adnexal region, likely
an ovarian cyst. Right ovary is unremarkable in appearance.

Other: No significant volume of ascites.  No pneumoperitoneum.

Musculoskeletal: There are no aggressive appearing lytic or blastic
lesions noted in the visualized portions of the skeleton.

VASCULAR MEASUREMENTS PERTINENT TO TAVR:

AORTA:

Minimal Aortic Siameter-JZ x 11 mm

Severity of Aortic Calcification-moderate

RIGHT PELVIS:

Right Common Iliac Artery -

Minimal 4iameter-7.V x 8.0 mm

Tortuosity-mild

Calcification-moderate to severe

Right External Iliac Artery -

Minimal Eiameter-M.H x 7.6 mm

Tortuosity-moderate

Calcification-none

Right Common Femoral Artery -

Minimal Uiameter-H.T x 7.8 mm

Tortuosity-mild

Calcification-none

LEFT PELVIS:

Left Common Iliac Artery -

Minimal Uiameter-H.T x 7.8 mm

Tortuosity-mild

Calcification-moderate to severe

Left External Iliac Artery -

Minimal Uiameter-H.T x 8.1 mm

Tortuosity-moderate

Calcification-none

Left Common Femoral Artery -

Minimal 2iameter-3.W x 6.7 mm

Tortuosity-mild

Calcification-none

Review of the MIP images confirms the above findings.
IMPRESSION: 1. Vascular findings and measurements pertinent to potential TAVR
procedure, as detailed above.
2. Severe thickening calcification of the aortic valve, compatible
with the reported clinical history of severe aortic stenosis.
3. Aortic atherosclerosis, in addition to left main and 3 vessel
coronary artery disease.
4. 4.6 x 2.7 x 3.7 cm low to intermediate attenuation lesion in the
left adnexal region, presumably an ovarian cyst. Further evaluation
with nonemergent pelvic ultrasound is recommended in the near future
to better evaluate this finding. This recommendation follows ACR
consensus guidelines: White Paper of the ACR Incidental Findings
Committee II on Adnexal Findings. [HOSPITAL] [DATE].
5. Cholelithiasis without evidence of acute cholecystitis at this
time.
6. Mild colonic diverticulosis without evidence of acute
diverticulitis at this time.

## 2019-12-22 ENCOUNTER — Ambulatory Visit: Payer: Medicare Other | Attending: Internal Medicine

## 2019-12-22 DIAGNOSIS — Z23 Encounter for immunization: Secondary | ICD-10-CM

## 2019-12-22 NOTE — Progress Notes (Signed)
   Covid-19 Vaccination Clinic  Name:  Shannon Kidd    MRN: [id number] DOB: [birth year]  12/22/2019  [name] was observed post Covid-19 immunization for 15 minutes without incident. She was provided with Vaccine Information Sheet and instruction to access the V-Safe system.   [name] was instructed to call 911 with any severe reactions post vaccine: Marland Kitchen Difficulty breathing  . Swelling of face and throat  . A fast heartbeat  . A bad rash all over body  . Dizziness and weakness   Immunizations Administered    Name Date Dose VIS Date Route   Pfizer COVID-19 Vaccine 12/22/2019  9:31 AM 0.3 mL 09/18/2019 Intramuscular   Manufacturer: [employer], [employer]   Lot: [id number]   NDC: [address]-9

## 2019-12-28 ENCOUNTER — Other Ambulatory Visit: Payer: Self-pay | Admitting: Endocrinology

## 2020-01-17 ENCOUNTER — Other Ambulatory Visit: Payer: Self-pay | Admitting: Endocrinology

## 2020-02-08 ENCOUNTER — Telehealth: Payer: Self-pay | Admitting: Cardiology

## 2020-02-08 NOTE — Telephone Encounter (Signed)
Left message on office voicemail asking for return call.

## 2020-02-08 NOTE — Telephone Encounter (Signed)
Pt c/o medication issue:  1. Name of Medication: losartan (COZAAR) 25 MG tablet  2. How are you currently taking this medication (dosage and times per day)? 25 mg  3. Are you having a reaction (difficulty breathing--STAT)?   4. What is your medication issue? Dr. Lucianne Muss, the patient's Endocrinologist wanted to adjust this medication. Dr. Zachery Dauer' office would like to verify that with Dr. Jens Som. The PCP wants all of the providers to be on the same page. Please call Dr. Zachery Dauer' office to discuss a possible change in the medicine

## 2020-02-08 NOTE — Telephone Encounter (Signed)
Per Candise Bowens Dr Zachery Dauer would like to speak with Dr Jens Som, will forward

## 2020-02-15 ENCOUNTER — Other Ambulatory Visit: Payer: Medicare Other

## 2020-02-16 ENCOUNTER — Other Ambulatory Visit: Payer: Self-pay | Admitting: Endocrinology

## 2020-02-18 ENCOUNTER — Ambulatory Visit: Payer: Medicare Other | Admitting: Endocrinology

## 2020-03-29 ENCOUNTER — Other Ambulatory Visit: Payer: Self-pay | Admitting: Endocrinology

## 2020-04-18 ENCOUNTER — Other Ambulatory Visit: Payer: Self-pay | Admitting: Endocrinology

## 2020-04-18 ENCOUNTER — Other Ambulatory Visit: Payer: Self-pay | Admitting: Cardiology

## 2020-04-18 DIAGNOSIS — I350 Nonrheumatic aortic (valve) stenosis: Secondary | ICD-10-CM

## 2020-04-25 ENCOUNTER — Other Ambulatory Visit: Payer: Self-pay | Admitting: Cardiology

## 2020-05-15 ENCOUNTER — Other Ambulatory Visit: Payer: Self-pay | Admitting: Endocrinology

## 2020-05-16 NOTE — Telephone Encounter (Signed)
Send 30-day prescription and needs to make follow-up appointment with labs

## 2020-05-16 NOTE — Telephone Encounter (Signed)
Left message for patient to call for follow up appointment. 

## 2020-05-16 NOTE — Telephone Encounter (Signed)
Patient requests RX refill of Glimepiride 1mg  . LOV 11/17/19. Patient advised to follow up in 3 months. No follow up has been scheduled. Please advise.

## 2020-05-30 ENCOUNTER — Other Ambulatory Visit: Payer: Self-pay | Admitting: Endocrinology

## 2020-06-02 LAB — HEMOGLOBIN A1C: Hemoglobin A1C: 7.3

## 2020-06-07 ENCOUNTER — Other Ambulatory Visit: Payer: Self-pay | Admitting: Family Medicine

## 2020-06-07 DIAGNOSIS — Z1231 Encounter for screening mammogram for malignant neoplasm of breast: Secondary | ICD-10-CM

## 2020-06-07 DIAGNOSIS — M8580 Other specified disorders of bone density and structure, unspecified site: Secondary | ICD-10-CM

## 2020-06-17 ENCOUNTER — Encounter: Payer: Self-pay | Admitting: *Deleted

## 2020-06-27 ENCOUNTER — Other Ambulatory Visit: Payer: Self-pay | Admitting: Endocrinology

## 2020-07-25 ENCOUNTER — Ambulatory Visit (HOSPITAL_COMMUNITY)
Admission: RE | Admit: 2020-07-25 | Payer: Medicare Other | Source: Ambulatory Visit | Attending: Cardiology | Admitting: Cardiology

## 2020-07-25 ENCOUNTER — Other Ambulatory Visit: Payer: Self-pay | Admitting: Cardiology

## 2020-07-25 ENCOUNTER — Other Ambulatory Visit: Payer: Self-pay | Admitting: Endocrinology

## 2020-07-25 DIAGNOSIS — I350 Nonrheumatic aortic (valve) stenosis: Secondary | ICD-10-CM

## 2020-07-27 ENCOUNTER — Ambulatory Visit (HOSPITAL_COMMUNITY)
Admission: RE | Admit: 2020-07-27 | Payer: Medicare Other | Source: Ambulatory Visit | Attending: Cardiology | Admitting: Cardiology

## 2020-07-28 ENCOUNTER — Ambulatory Visit (HOSPITAL_COMMUNITY)
Admission: RE | Admit: 2020-07-28 | Discharge: 2020-07-28 | Disposition: A | Discharge status: Home / Self Care | Payer: Medicare Other | Source: Ambulatory Visit | Attending: Cardiovascular Disease | Admitting: Cardiovascular Disease

## 2020-07-28 ENCOUNTER — Other Ambulatory Visit: Payer: Self-pay

## 2020-07-28 DIAGNOSIS — I6523 Occlusion and stenosis of bilateral carotid arteries: Secondary | ICD-10-CM | POA: Diagnosis not present

## 2020-07-29 ENCOUNTER — Encounter: Payer: Self-pay | Admitting: *Deleted

## 2020-09-26 ENCOUNTER — Other Ambulatory Visit: Payer: Self-pay | Admitting: Endocrinology

## 2020-10-11 DIAGNOSIS — M9901 Segmental and somatic dysfunction of cervical region: Secondary | ICD-10-CM | POA: Diagnosis not present

## 2020-10-11 DIAGNOSIS — M542 Cervicalgia: Secondary | ICD-10-CM | POA: Diagnosis not present

## 2020-10-11 DIAGNOSIS — M9903 Segmental and somatic dysfunction of lumbar region: Secondary | ICD-10-CM | POA: Diagnosis not present

## 2020-10-11 DIAGNOSIS — M9902 Segmental and somatic dysfunction of thoracic region: Secondary | ICD-10-CM | POA: Diagnosis not present

## 2020-10-11 DIAGNOSIS — M546 Pain in thoracic spine: Secondary | ICD-10-CM | POA: Diagnosis not present

## 2020-10-13 DIAGNOSIS — M546 Pain in thoracic spine: Secondary | ICD-10-CM | POA: Diagnosis not present

## 2020-10-13 DIAGNOSIS — M542 Cervicalgia: Secondary | ICD-10-CM | POA: Diagnosis not present

## 2020-10-13 DIAGNOSIS — M9903 Segmental and somatic dysfunction of lumbar region: Secondary | ICD-10-CM | POA: Diagnosis not present

## 2020-10-13 DIAGNOSIS — M9902 Segmental and somatic dysfunction of thoracic region: Secondary | ICD-10-CM | POA: Diagnosis not present

## 2020-10-18 ENCOUNTER — Other Ambulatory Visit: Payer: Self-pay | Admitting: Endocrinology

## 2020-10-18 ENCOUNTER — Other Ambulatory Visit: Payer: Self-pay | Admitting: Cardiology

## 2020-10-18 DIAGNOSIS — M8580 Other specified disorders of bone density and structure, unspecified site: Secondary | ICD-10-CM | POA: Diagnosis not present

## 2020-10-18 DIAGNOSIS — I350 Nonrheumatic aortic (valve) stenosis: Secondary | ICD-10-CM

## 2020-10-18 DIAGNOSIS — I10 Essential (primary) hypertension: Secondary | ICD-10-CM | POA: Diagnosis not present

## 2020-10-18 DIAGNOSIS — E1122 Type 2 diabetes mellitus with diabetic chronic kidney disease: Secondary | ICD-10-CM | POA: Diagnosis not present

## 2020-10-18 DIAGNOSIS — I272 Pulmonary hypertension, unspecified: Secondary | ICD-10-CM | POA: Diagnosis not present

## 2020-10-18 DIAGNOSIS — N183 Chronic kidney disease, stage 3 unspecified: Secondary | ICD-10-CM | POA: Diagnosis not present

## 2020-10-18 DIAGNOSIS — E7800 Pure hypercholesterolemia, unspecified: Secondary | ICD-10-CM | POA: Diagnosis not present

## 2020-10-18 DIAGNOSIS — E1165 Type 2 diabetes mellitus with hyperglycemia: Secondary | ICD-10-CM | POA: Diagnosis not present

## 2020-10-18 NOTE — Progress Notes (Deleted)
HPI: FU ASs/p TAVR.Patient had follow-up echocardiogram August 2019 that showed normal LV function and severe aortic stenosis with mean gradient 45 mmHg. There was moderate tricuspid regurgitation. Cardiac catheterization August 2019 showed calcified coronaries but only mild nonobstructive disease. There was severe aortic stenosis and moderate pulmonary hypertension felt secondary to elevated left heart pressures. Had TAVR October 2019. Follow-up echocardiogram December 2019 showed normal LV function, aortic valve replacement with mean gradient 12 mmHg, mild mitral regurgitation, mild left atrial enlargement and mild tricuspid regurgitation. Echocardiogram repeated October 2020.  Normal LV function, stable aortic valve replacement with mean gradient 11 mmHg and no aortic insufficiency. Carotid Dopplers October 2021 showed 1 to 39% bilateral stenosis.  Since last seen   Current Outpatient Medications  Medication Sig Dispense Refill  . albuterol (PROVENTIL HFA;VENTOLIN HFA) 108 (90 Base) MCG/ACT inhaler Inhale 2 puffs into the lungs every 6 (six) hours as needed for wheezing or shortness of breath.    Marland Kitchen amLODipine (NORVASC) 5 MG tablet TAKE ONE TABLET BY MOUTH EVERY MORNING 180 tablet 3  . Ascorbic Acid (VITAMIN C) 1000 MG tablet Take 1,000 mg by mouth daily.    Marland Kitchen aspirin 81 MG tablet Take 81 mg by mouth daily.    . Blood Glucose Monitoring Suppl (ONETOUCH VERIO IQ SYSTEM) w/Device KIT USE TO CHECK BLOOD SUGAR ONCE DAILY Dx code E11.9 1 kit 2  . Cholecalciferol (VITAMIN D) 2000 UNITS tablet Take 2,000 Units by mouth daily.    Marland Kitchen glimepiride (AMARYL) 1 MG tablet Take one tablet daily at bedtime.-FOLLOW UP APPOINTMENT NEEDED FOR REFILLS 90 tablet 1  . losartan (COZAAR) 25 MG tablet TAKE ONE TABLET BY MOUTH EVERY MORNING 30 tablet 0  . metFORMIN (GLUCOPHAGE-XR) 500 MG 24 hr tablet TAKE TWO TABLETS BY MOUTH EVERY EVENING 180 tablet 0  . nystatin cream (MYCOSTATIN) Apply topically 2 (two) times  daily. (Patient taking differently: Apply topically 2 (two) times daily as needed. ) 30 g 12  . ONETOUCH DELICA LANCETS 54T MISC USE TO CHECK BLOOD SUGAR ONCE DAILY Dx code E11.9 100 each 2  . ONETOUCH VERIO test strip USE 1 STRIP TO CHECK GLUCOSE ONCE DAILY 50 each 0  . pramipexole (MIRAPEX) 0.25 MG tablet Take 0.25 mg by mouth at bedtime.     . rosuvastatin (CRESTOR) 20 MG tablet TAKE ONE TABLET BY MOUTH EVERYDAY AT BEDTIME 90 tablet 0  . Semaglutide (RYBELSUS) 3 MG TABS Take 3 mg by mouth daily before breakfast. (Patient not taking: Reported on 11/17/2019) 90 tablet 0  . sertraline (ZOLOFT) 50 MG tablet Take 50 mg by mouth daily.    . vitamin B-12 (CYANOCOBALAMIN) 1000 MCG tablet Take 1,000 mcg by mouth daily.     No current facility-administered medications for this visit.     Past Medical History:  Diagnosis Date  . Anxiety   . Cervical myelopathy (HCC)    s/p cervical fusion @ [facility]  . Chronic diastolic congestive heart failure ([place])   . CKD (chronic kidney disease), stage III   . Diabetes mellitus ([place])   . Glaucoma   . HTN (hypertension)   . Hyperlipemia   . Morbid obesity ([place])   . Rheumatic fever   . S/P TAVR (transcatheter aortic valve replacement) 07/29/2018   26 mm Edwards Sapien 3 transcatheter heart valve placed via percutaneous right transfemoral approach   . Severe aortic stenosis     Past Surgical History:  Procedure Laterality Date  . APPENDECTOMY    .  CARDIAC CATHETERIZATION    . EYE SURGERY Bilateral    cataract extraction bilateral with lens  . INTRAOPERATIVE TRANSTHORACIC ECHOCARDIOGRAM  07/29/2018   Procedure: INTRAOPERATIVE TRANSTHORACIC ECHOCARDIOGRAM;  Surgeon: Sherren Mocha, MD;  Location: [place];  Service: Open Heart Surgery;;  . NECK SURGERY    . RIGHT/LEFT HEART CATH AND CORONARY ANGIOGRAPHY N/A 06/04/2018   Procedure: RIGHT/LEFT HEART CATH AND CORONARY ANGIOGRAPHY;  Surgeon: Sherren Mocha, MD;  Location: [facility];  Service:  Cardiovascular;  Laterality: N/A;  . TONSILLECTOMY    . TRANSCATHETER AORTIC VALVE REPLACEMENT, TRANSFEMORAL N/A 07/29/2018   Procedure: TRANSCATHETER AORTIC VALVE REPLACEMENT, TRANSFEMORAL;  Surgeon: Sherren Mocha, MD;  Location: [facility];  Service: Open Heart Surgery;  Laterality: N/A;  . TUBAL LIGATION    . VAGINAL HYSTERECTOMY      Social History   Socioeconomic History  . Marital status: Married    Spouse name: Not on file  . Number of children: 2  . Years of education: Not on file  . Highest education level: Not on file  Occupational History  . Not on file  Tobacco Use  . Smoking status: Never Smoker  . Smokeless tobacco: Never Used  Vaping Use  . Vaping Use: Never used  Substance and Sexual Activity  . Alcohol use: No    Alcohol/week: 0.0 standard drinks  . Drug use: Never  . Sexual activity: Not on file  Other Topics Concern  . Not on file  Social History Narrative  . Not on file   Social Determinants of Health   Financial Resource Strain: Not on file  Food Insecurity: Not on file  Transportation Needs: Not on file  Physical Activity: Not on file  Stress: Not on file  Social Connections: Not on file  Intimate Partner Violence: Not on file    Family History  Problem Relation Age of Onset  . Diabetes Mother   . Cancer Mother        Colon  . Diabetes Sister   . Diabetes Paternal Grandmother   . Breast cancer Maternal Grandmother 75    ROS: no fevers or chills, productive cough, hemoptysis, dysphasia, odynophagia, melena, hematochezia, dysuria, hematuria, rash, seizure activity, orthopnea, PND, pedal edema, claudication. Remaining systems are negative.  Physical Exam: Well-developed well-nourished in no acute distress.  Skin is warm and dry.  HEENT is normal.  Neck is supple.  Chest is clear to auscultation with normal expansion.  Cardiovascular exam is regular rate and rhythm.  Abdominal exam nontender or distended. No masses palpated. Extremities  show no edema. neuro grossly intact  ECG- personally reviewed  A/P  1 status post TAVR-patient doing well with no dyspnea.  Continue SBE prophylaxis.  Most recent echocardiogram showed normal function of aortic valve.  2 hypertension-patient's blood pressures controlled.  Continue present medications and follow.  3 hyperlipidemia-continue statin.  4 carotid artery disease-mild on most recent Dopplers.  Kirk Ruths, MD

## 2020-10-20 DIAGNOSIS — M546 Pain in thoracic spine: Secondary | ICD-10-CM | POA: Diagnosis not present

## 2020-10-20 DIAGNOSIS — M542 Cervicalgia: Secondary | ICD-10-CM | POA: Diagnosis not present

## 2020-10-20 DIAGNOSIS — M9903 Segmental and somatic dysfunction of lumbar region: Secondary | ICD-10-CM | POA: Diagnosis not present

## 2020-10-20 DIAGNOSIS — M9902 Segmental and somatic dysfunction of thoracic region: Secondary | ICD-10-CM | POA: Diagnosis not present

## 2020-10-25 ENCOUNTER — Ambulatory Visit: Payer: Medicare Other

## 2020-10-25 ENCOUNTER — Other Ambulatory Visit: Payer: Medicare Other

## 2020-10-27 DIAGNOSIS — R059 Cough, unspecified: Secondary | ICD-10-CM | POA: Diagnosis not present

## 2020-10-27 DIAGNOSIS — J029 Acute pharyngitis, unspecified: Secondary | ICD-10-CM | POA: Diagnosis not present

## 2020-10-28 ENCOUNTER — Ambulatory Visit: Payer: Medicare Other | Admitting: Cardiology

## 2020-11-14 ENCOUNTER — Other Ambulatory Visit: Payer: Self-pay | Admitting: Endocrinology

## 2020-11-18 DIAGNOSIS — I272 Pulmonary hypertension, unspecified: Secondary | ICD-10-CM | POA: Diagnosis not present

## 2020-11-18 DIAGNOSIS — M8580 Other specified disorders of bone density and structure, unspecified site: Secondary | ICD-10-CM | POA: Diagnosis not present

## 2020-11-18 DIAGNOSIS — I10 Essential (primary) hypertension: Secondary | ICD-10-CM | POA: Diagnosis not present

## 2020-11-18 DIAGNOSIS — E1122 Type 2 diabetes mellitus with diabetic chronic kidney disease: Secondary | ICD-10-CM | POA: Diagnosis not present

## 2020-11-18 DIAGNOSIS — E7800 Pure hypercholesterolemia, unspecified: Secondary | ICD-10-CM | POA: Diagnosis not present

## 2020-11-18 DIAGNOSIS — N183 Chronic kidney disease, stage 3 unspecified: Secondary | ICD-10-CM | POA: Diagnosis not present

## 2020-11-18 DIAGNOSIS — E1165 Type 2 diabetes mellitus with hyperglycemia: Secondary | ICD-10-CM | POA: Diagnosis not present

## 2020-11-25 ENCOUNTER — Other Ambulatory Visit: Payer: Self-pay | Admitting: Cardiology

## 2020-11-25 ENCOUNTER — Other Ambulatory Visit: Payer: Self-pay | Admitting: Endocrinology

## 2020-11-25 DIAGNOSIS — I350 Nonrheumatic aortic (valve) stenosis: Secondary | ICD-10-CM

## 2020-12-15 ENCOUNTER — Other Ambulatory Visit: Payer: Self-pay | Admitting: Endocrinology

## 2020-12-15 DIAGNOSIS — M8580 Other specified disorders of bone density and structure, unspecified site: Secondary | ICD-10-CM | POA: Diagnosis not present

## 2020-12-15 DIAGNOSIS — E7800 Pure hypercholesterolemia, unspecified: Secondary | ICD-10-CM | POA: Diagnosis not present

## 2020-12-15 DIAGNOSIS — N183 Chronic kidney disease, stage 3 unspecified: Secondary | ICD-10-CM | POA: Diagnosis not present

## 2020-12-15 DIAGNOSIS — I272 Pulmonary hypertension, unspecified: Secondary | ICD-10-CM | POA: Diagnosis not present

## 2020-12-15 DIAGNOSIS — E1165 Type 2 diabetes mellitus with hyperglycemia: Secondary | ICD-10-CM | POA: Diagnosis not present

## 2020-12-15 DIAGNOSIS — I10 Essential (primary) hypertension: Secondary | ICD-10-CM | POA: Diagnosis not present

## 2020-12-15 DIAGNOSIS — E1122 Type 2 diabetes mellitus with diabetic chronic kidney disease: Secondary | ICD-10-CM | POA: Diagnosis not present

## 2020-12-20 DIAGNOSIS — Z961 Presence of intraocular lens: Secondary | ICD-10-CM | POA: Diagnosis not present

## 2020-12-20 DIAGNOSIS — H532 Diplopia: Secondary | ICD-10-CM | POA: Diagnosis not present

## 2020-12-20 DIAGNOSIS — H4923 Sixth [abducent] nerve palsy, bilateral: Secondary | ICD-10-CM | POA: Diagnosis not present

## 2020-12-20 DIAGNOSIS — E119 Type 2 diabetes mellitus without complications: Secondary | ICD-10-CM | POA: Diagnosis not present

## 2020-12-20 DIAGNOSIS — H5051 Esophoria: Secondary | ICD-10-CM | POA: Diagnosis not present

## 2020-12-23 ENCOUNTER — Telehealth: Payer: Self-pay | Admitting: Endocrinology

## 2020-12-23 DIAGNOSIS — E1165 Type 2 diabetes mellitus with hyperglycemia: Secondary | ICD-10-CM

## 2020-12-23 MED ORDER — LOSARTAN POTASSIUM 25 MG PO TABS: 25.0000 mg | ORAL_TABLET | Freq: Every morning | ORAL | 0 refills | Status: DC

## 2020-12-23 MED ORDER — GLIMEPIRIDE 1 MG PO TABS: ORAL_TABLET | ORAL | 1 refills | Status: DC

## 2020-12-23 MED ORDER — METFORMIN HCL ER 500 MG PO TB24: 1000.0000 mg | ORAL_TABLET | Freq: Every evening | ORAL | 0 refills | Status: DC

## 2020-12-23 NOTE — Telephone Encounter (Signed)
MEDICATION: glimepiride, metformin, lostarn  PHARMACY:   [facility] - [place], [place] - [address] Phone:  [phone]  Fax:  [phone]      HAS THE PATIENT CONTACTED [place]?  yes  IS THIS A 90 DAY SUPPLY :   IS PATIENT OUT OF MEDICATION: yes  IF NOT; HOW MUCH IS LEFT:   LAST APPOINTMENT DATE: '@3'$ /07/2021  NEXT APPOINTMENT DATE:'@5'$ /03/2021  DO WE HAVE YOUR PERMISSION TO LEAVE A DETAILED MESSAGE?:  OTHER COMMENTS:    **Let patient know to contact pharmacy at the end of the day to make sure medication is ready. **  ** Please notify patient to allow 48-72 hours to process**  **Encourage patient to contact the pharmacy for refills or they can request refills through [facility]**

## 2020-12-25 DIAGNOSIS — B351 Tinea unguium: Secondary | ICD-10-CM | POA: Diagnosis not present

## 2020-12-25 DIAGNOSIS — S91201A Unspecified open wound of right great toe with damage to nail, initial encounter: Secondary | ICD-10-CM | POA: Diagnosis not present

## 2020-12-25 DIAGNOSIS — L03031 Cellulitis of right toe: Secondary | ICD-10-CM | POA: Diagnosis not present

## 2021-01-05 ENCOUNTER — Ambulatory Visit: Payer: Medicare Other | Admitting: Podiatry

## 2021-01-05 ENCOUNTER — Other Ambulatory Visit: Payer: Self-pay

## 2021-01-05 ENCOUNTER — Encounter: Payer: Self-pay | Admitting: Podiatry

## 2021-01-05 DIAGNOSIS — D649 Anemia, unspecified: Secondary | ICD-10-CM | POA: Insufficient documentation

## 2021-01-05 DIAGNOSIS — M8580 Other specified disorders of bone density and structure, unspecified site: Secondary | ICD-10-CM | POA: Insufficient documentation

## 2021-01-05 DIAGNOSIS — F419 Anxiety disorder, unspecified: Secondary | ICD-10-CM | POA: Insufficient documentation

## 2021-01-05 DIAGNOSIS — H4920 Sixth [abducent] nerve palsy, unspecified eye: Secondary | ICD-10-CM | POA: Insufficient documentation

## 2021-01-05 DIAGNOSIS — I272 Pulmonary hypertension, unspecified: Secondary | ICD-10-CM | POA: Insufficient documentation

## 2021-01-05 DIAGNOSIS — R937 Abnormal findings on diagnostic imaging of other parts of musculoskeletal system: Secondary | ICD-10-CM | POA: Insufficient documentation

## 2021-01-05 DIAGNOSIS — E1121 Type 2 diabetes mellitus with diabetic nephropathy: Secondary | ICD-10-CM | POA: Insufficient documentation

## 2021-01-05 DIAGNOSIS — M79609 Pain in unspecified limb: Secondary | ICD-10-CM | POA: Diagnosis not present

## 2021-01-05 DIAGNOSIS — M792 Neuralgia and neuritis, unspecified: Secondary | ICD-10-CM | POA: Insufficient documentation

## 2021-01-05 DIAGNOSIS — G2581 Restless legs syndrome: Secondary | ICD-10-CM | POA: Insufficient documentation

## 2021-01-05 DIAGNOSIS — M5090 Cervical disc disorder, unspecified, unspecified cervical region: Secondary | ICD-10-CM | POA: Insufficient documentation

## 2021-01-05 DIAGNOSIS — Q388 Other congenital malformations of pharynx: Secondary | ICD-10-CM | POA: Insufficient documentation

## 2021-01-05 DIAGNOSIS — E1165 Type 2 diabetes mellitus with hyperglycemia: Secondary | ICD-10-CM | POA: Insufficient documentation

## 2021-01-05 DIAGNOSIS — I359 Nonrheumatic aortic valve disorder, unspecified: Secondary | ICD-10-CM | POA: Insufficient documentation

## 2021-01-05 DIAGNOSIS — B351 Tinea unguium: Secondary | ICD-10-CM

## 2021-01-05 DIAGNOSIS — E119 Type 2 diabetes mellitus without complications: Secondary | ICD-10-CM | POA: Diagnosis not present

## 2021-01-05 DIAGNOSIS — M899 Disorder of bone, unspecified: Secondary | ICD-10-CM | POA: Insufficient documentation

## 2021-01-05 DIAGNOSIS — N949 Unspecified condition associated with female genital organs and menstrual cycle: Secondary | ICD-10-CM | POA: Insufficient documentation

## 2021-01-05 NOTE — Progress Notes (Signed)
Subjective:  Patient ID: Shannon Kidd, female    DOB: 30-Sep-1946,  MRN: [id number] HPI Chief Complaint  Patient presents with  . Nail Problem    Toenails - thick and dark, had 1st right nail removed a week ago, on antibiotics-better, unable to trim nails herself  . New Patient (Initial Visit)  . Diabetes    Last a1c was 6.5    75 y.o. female presents with the above complaint.   ROS: Denies fever chills nausea vomiting muscle aches neck pain chest pain shortness of breath.    Past Medical History:  Diagnosis Date  . Anxiety   . Cervical myelopathy (HCC)    s/p cervical fusion @ [facility]  . Chronic diastolic congestive heart failure ([place])   . CKD (chronic kidney disease), stage III ([place])   . Diabetes mellitus ([place])   . Glaucoma   . HTN (hypertension)   . Hyperlipemia   . Morbid obesity ([place])   . Rheumatic fever   . S/P TAVR (transcatheter aortic valve replacement) 07/29/2018   26 mm Edwards Sapien 3 transcatheter heart valve placed via percutaneous right transfemoral approach   . Severe aortic stenosis    Past Surgical History:  Procedure Laterality Date  . APPENDECTOMY    . CARDIAC CATHETERIZATION    . EYE SURGERY Bilateral    cataract extraction bilateral with lens  . INTRAOPERATIVE TRANSTHORACIC ECHOCARDIOGRAM  07/29/2018   Procedure: INTRAOPERATIVE TRANSTHORACIC ECHOCARDIOGRAM;  Surgeon: Sherren Mocha, MD;  Location: [facility];  Service: Open Heart Surgery;;  . NECK SURGERY    . RIGHT/LEFT HEART CATH AND CORONARY ANGIOGRAPHY N/A 06/04/2018   Procedure: RIGHT/LEFT HEART CATH AND CORONARY ANGIOGRAPHY;  Surgeon: Sherren Mocha, MD;  Location: [place] [facility];  Service: Cardiovascular;  Laterality: N/A;  . TONSILLECTOMY    . TRANSCATHETER AORTIC VALVE REPLACEMENT, TRANSFEMORAL N/A 07/29/2018   Procedure: TRANSCATHETER AORTIC VALVE REPLACEMENT, TRANSFEMORAL;  Surgeon: Sherren Mocha, MD;  Location: [facility];  Service: Open Heart Surgery;  Laterality: N/A;  . TUBAL  LIGATION    . VAGINAL HYSTERECTOMY      Current Outpatient Medications:  .  albuterol (PROVENTIL HFA;VENTOLIN HFA) 108 (90 Base) MCG/ACT inhaler, Inhale 2 puffs into the lungs every 6 (six) hours as needed for wheezing or shortness of breath., Disp: , Rfl:  .  amLODipine (NORVASC) 5 MG tablet, TAKE ONE TABLET BY MOUTH EVERY MORNING, Disp: 180 tablet, Rfl: 3 .  Ascorbic Acid (VITAMIN C) 1000 MG tablet, Take 1,000 mg by mouth daily., Disp: , Rfl:  .  aspirin 81 MG tablet, Take 81 mg by mouth daily., Disp: , Rfl:  .  Blood Glucose Monitoring Suppl (ONETOUCH VERIO IQ SYSTEM) w/Device KIT, USE TO CHECK BLOOD SUGAR ONCE DAILY Dx code E11.9, Disp: 1 kit, Rfl: 2 .  Cholecalciferol (VITAMIN D) 2000 UNITS tablet, Take 2,000 Units by mouth daily., Disp: , Rfl:  .  glimepiride (AMARYL) 1 MG tablet, Take one tablet daily at bedtime.-FOLLOW UP APPOINTMENT NEEDED FOR REFILLS, Disp: 90 tablet, Rfl: 1 .  losartan (COZAAR) 25 MG tablet, Take 1 tablet (25 mg total) by mouth every morning., Disp: 30 tablet, Rfl: 0 .  metFORMIN (GLUCOPHAGE-XR) 500 MG 24 hr tablet, Take 2 tablets (1,000 mg total) by mouth every evening., Disp: 60 tablet, Rfl: 0 .  ONETOUCH DELICA LANCETS 51O MISC, USE TO CHECK BLOOD SUGAR ONCE DAILY Dx code E11.9, Disp: 100 each, Rfl: 2 .  ONETOUCH VERIO test strip, USE 1 STRIP TO CHECK GLUCOSE ONCE DAILY,  Disp: 50 each, Rfl: 0 .  pramipexole (MIRAPEX) 0.25 MG tablet, Take 0.25 mg by mouth at bedtime. , Disp: , Rfl:  .  rosuvastatin (CRESTOR) 20 MG tablet, TAKE ONE TABLET BY MOUTH EVERYDAY AT BEDTIME, Disp: 30 tablet, Rfl: 2 .  sertraline (ZOLOFT) 50 MG tablet, Take 50 mg by mouth daily., Disp: , Rfl:  .  sulfamethoxazole-trimethoprim (BACTRIM DS) 800-160 MG tablet, Take 1 tablet by mouth 2 (two) times daily., Disp: , Rfl:  .  vitamin B-12 (CYANOCOBALAMIN) 1000 MCG tablet, Take 1,000 mcg by mouth daily., Disp: , Rfl:   Allergies  Allergen Reactions  . Actos [Pioglitazone] Swelling   Review of  Systems Objective:  There were no vitals filed for this visit.  General: Well developed, nourished, in no acute distress, alert and oriented x3   Dermatological: Skin is warm, dry and supple bilateral. Nails x 10 are thick yellow dystrophic possibly mycotic; remaining integument appears unremarkable at this time. There are no open sores, no preulcerative lesions, no rash or signs of infection present.  Vascular: Dorsalis Pedis artery and Posterior Tibial artery pedal pulses are 2/4 bilateral with immedate capillary fill time. Pedal hair growth present. No varicosities and no lower extremity edema present bilateral.   Neruologic: Grossly intact via light touch bilateral. Vibratory intact via tuning fork bilateral. Protective threshold with Semmes Wienstein monofilament intact to all pedal sites bilateral. Patellar and Achilles deep tendon reflexes 2+ bilateral. No Babinski or clonus noted bilateral.   Musculoskeletal: No gross boney pedal deformities bilateral. No pain, crepitus, or limitation noted with foot and ankle range of motion bilateral. Muscular strength 5/5 in all groups tested bilateral.  Gait: Unassisted, Nonantalgic.    Radiographs:  None taken  Assessment & Plan:   Assessment: Pain in limb secondary to onychomycosis  Plan: Debridement of toenails 1 through 5 bilateral cover service and she will follow up with Dr. Ahmed Prima T. [place], [place]

## 2021-01-05 NOTE — Progress Notes (Signed)
HPI:  FU ASs/p TAVR.Patient had follow-up echocardiogram August 2019 that showed normal LV function and severe aortic stenosis with mean gradient 45 mmHg. There was moderate tricuspid regurgitation. Cardiac catheterization August 2019 showed calcified coronaries but only mild nonobstructive disease. There was severe aortic stenosis and moderate pulmonary hypertension felt secondary to elevated left heart pressures. Had TAVR October 2019. Echocardiogram repeated October 2020. Normal LV function, stable aortic valve replacement with mean gradient 11 mmHg and no aortic insufficiency. Carotid Dopplers October 2021 showed 1 to 39% bilateral stenosis. Since last seen  she denies increased dyspnea, chest pain, palpitations or syncope.  Current Outpatient Medications  Medication Sig Dispense Refill  . albuterol (PROVENTIL HFA;VENTOLIN HFA) 108 (90 Base) MCG/ACT inhaler Inhale 2 puffs into the lungs every 6 (six) hours as needed for wheezing or shortness of breath.    Marland Kitchen amLODipine (NORVASC) 5 MG tablet TAKE ONE TABLET BY MOUTH EVERY MORNING 180 tablet 3  . Ascorbic Acid (VITAMIN C) 1000 MG tablet Take 1,000 mg by mouth daily.    Marland Kitchen aspirin 81 MG tablet Take 81 mg by mouth daily.    . Blood Glucose Monitoring Suppl (ONETOUCH VERIO IQ SYSTEM) w/Device KIT USE TO CHECK BLOOD SUGAR ONCE DAILY Dx code E11.9 1 kit 2  . Cholecalciferol (VITAMIN D) 2000 UNITS tablet Take 2,000 Units by mouth daily.    Marland Kitchen glimepiride (AMARYL) 1 MG tablet Take one tablet daily at bedtime.-FOLLOW UP APPOINTMENT NEEDED FOR REFILLS 90 tablet 1  . losartan (COZAAR) 25 MG tablet Take 1 tablet (25 mg total) by mouth every morning. 30 tablet 0  . metFORMIN (GLUCOPHAGE-XR) 500 MG 24 hr tablet Take 2 tablets (1,000 mg total) by mouth every evening. 60 tablet 0  . ONETOUCH DELICA LANCETS 02B MISC USE TO CHECK BLOOD SUGAR ONCE DAILY Dx code E11.9 100 each 2  . ONETOUCH VERIO test strip USE 1 STRIP TO CHECK GLUCOSE ONCE DAILY 50 each 0  .  pramipexole (MIRAPEX) 0.25 MG tablet Take 0.25 mg by mouth at bedtime.     . rosuvastatin (CRESTOR) 20 MG tablet TAKE ONE TABLET BY MOUTH EVERYDAY AT BEDTIME 30 tablet 2  . sertraline (ZOLOFT) 50 MG tablet Take 50 mg by mouth daily.    Marland Kitchen sulfamethoxazole-trimethoprim (BACTRIM DS) 800-160 MG tablet Take 1 tablet by mouth 2 (two) times daily.    . vitamin B-12 (CYANOCOBALAMIN) 1000 MCG tablet Take 1,000 mcg by mouth daily.     No current facility-administered medications for this visit.     Past Medical History:  Diagnosis Date  . Anxiety   . Cervical myelopathy (HCC)    s/p cervical fusion @ [facility]  . Chronic diastolic congestive heart failure ([place])   . CKD (chronic kidney disease), stage III ([place])   . Diabetes mellitus ([place])   . Glaucoma   . HTN (hypertension)   . Hyperlipemia   . Morbid obesity ([place])   . Rheumatic fever   . S/P TAVR (transcatheter aortic valve replacement) 07/29/2018   26 mm Edwards Sapien 3 transcatheter heart valve placed via percutaneous right transfemoral approach   . Severe aortic stenosis     Past Surgical History:  Procedure Laterality Date  . APPENDECTOMY    . CARDIAC CATHETERIZATION    . EYE SURGERY Bilateral    cataract extraction bilateral with lens  . INTRAOPERATIVE TRANSTHORACIC ECHOCARDIOGRAM  07/29/2018   Procedure: INTRAOPERATIVE TRANSTHORACIC ECHOCARDIOGRAM;  Surgeon: Sherren Mocha, MD;  Location: [facility];  Service: Open  Heart Surgery;;  . NECK SURGERY    . RIGHT/LEFT HEART CATH AND CORONARY ANGIOGRAPHY N/A 06/04/2018   Procedure: RIGHT/LEFT HEART CATH AND CORONARY ANGIOGRAPHY;  Surgeon: Sherren Mocha, MD;  Location: [facility];  Service: Cardiovascular;  Laterality: N/A;  . TONSILLECTOMY    . TRANSCATHETER AORTIC VALVE REPLACEMENT, TRANSFEMORAL N/A 07/29/2018   Procedure: TRANSCATHETER AORTIC VALVE REPLACEMENT, TRANSFEMORAL;  Surgeon: Sherren Mocha, MD;  Location: [facility];  Service: Open Heart Surgery;  Laterality: N/A;  .  TUBAL LIGATION    . VAGINAL HYSTERECTOMY      Social History   Socioeconomic History  . Marital status: Married    Spouse name: Not on file  . Number of children: 2  . Years of education: Not on file  . Highest education level: Not on file  Occupational History  . Not on file  Tobacco Use  . Smoking status: Never Smoker  . Smokeless tobacco: Never Used  Vaping Use  . Vaping Use: Never used  Substance and Sexual Activity  . Alcohol use: No    Alcohol/week: 0.0 standard drinks  . Drug use: Never  . Sexual activity: Not on file  Other Topics Concern  . Not on file  Social History Narrative  . Not on file   Social Determinants of Health   Financial Resource Strain: Not on file  Food Insecurity: Not on file  Transportation Needs: Not on file  Physical Activity: Not on file  Stress: Not on file  Social Connections: Not on file  Intimate Partner Violence: Not on file    Family History  Problem Relation Age of Onset  . Diabetes Mother   . Cancer Mother        Colon  . Diabetes Sister   . Diabetes Paternal Grandmother   . Breast cancer Maternal Grandmother 75    ROS: no fevers or chills, productive cough, hemoptysis, dysphasia, odynophagia, melena, hematochezia, dysuria, hematuria, rash, seizure activity, orthopnea, PND, pedal edema, claudication. Remaining systems are negative.  Physical Exam: Well-developed well-nourished in no acute distress.  Skin is warm and dry.  HEENT is normal.  Neck is supple.  Chest is clear to auscultation with normal expansion.  Cardiovascular exam is regular rate and rhythm.  2/6 systolic murmur left sternal border.  No diastolic murmur. Abdominal exam nontender or distended. No masses palpated. Extremities show no edema. neuro grossly intact  ECG-sinus bradycardia, first-degree AV block, poor R wave progression.  Personally reviewed  A/P  1 history of TAVR-most recent echocardiogram showed normally functioning valve.  Continue  SBE prophylaxis.  2 carotid artery disease-mild on most recent Dopplers.  3 hypertension-blood pressure controlled.  Continue present medications and follow.  4 hyperlipidemia-continue statin.  5 morbid obesity-we discussed the importance of weight loss.  Kirk Ruths, MD

## 2021-01-10 ENCOUNTER — Ambulatory Visit: Payer: Medicare Other | Admitting: Cardiology

## 2021-01-12 ENCOUNTER — Other Ambulatory Visit: Payer: Self-pay

## 2021-01-12 ENCOUNTER — Encounter: Payer: Self-pay | Admitting: Cardiology

## 2021-01-12 ENCOUNTER — Ambulatory Visit (INDEPENDENT_AMBULATORY_CARE_PROVIDER_SITE_OTHER): Payer: Medicare Other | Admitting: Cardiology

## 2021-01-12 VITALS — BP 140/60 | HR 57 | Ht 60.0 in | Wt 193.0 lb

## 2021-01-12 DIAGNOSIS — I10 Essential (primary) hypertension: Secondary | ICD-10-CM | POA: Diagnosis not present

## 2021-01-12 DIAGNOSIS — E7800 Pure hypercholesterolemia, unspecified: Secondary | ICD-10-CM | POA: Diagnosis not present

## 2021-01-12 DIAGNOSIS — Z952 Presence of prosthetic heart valve: Secondary | ICD-10-CM | POA: Diagnosis not present

## 2021-01-12 NOTE — Patient Instructions (Signed)

## 2021-02-10 ENCOUNTER — Other Ambulatory Visit: Payer: Medicare Other

## 2021-02-13 ENCOUNTER — Other Ambulatory Visit: Payer: Self-pay | Admitting: Endocrinology

## 2021-02-13 DIAGNOSIS — E1165 Type 2 diabetes mellitus with hyperglycemia: Secondary | ICD-10-CM

## 2021-02-14 ENCOUNTER — Other Ambulatory Visit: Payer: Self-pay | Admitting: Endocrinology

## 2021-02-14 DIAGNOSIS — E1165 Type 2 diabetes mellitus with hyperglycemia: Secondary | ICD-10-CM

## 2021-02-14 DIAGNOSIS — R809 Proteinuria, unspecified: Secondary | ICD-10-CM

## 2021-02-14 DIAGNOSIS — E1129 Type 2 diabetes mellitus with other diabetic kidney complication: Secondary | ICD-10-CM

## 2021-02-15 ENCOUNTER — Ambulatory Visit: Payer: Medicare Other | Admitting: Endocrinology

## 2021-02-15 DIAGNOSIS — E7800 Pure hypercholesterolemia, unspecified: Secondary | ICD-10-CM | POA: Diagnosis not present

## 2021-02-15 DIAGNOSIS — I10 Essential (primary) hypertension: Secondary | ICD-10-CM | POA: Diagnosis not present

## 2021-02-15 DIAGNOSIS — M8580 Other specified disorders of bone density and structure, unspecified site: Secondary | ICD-10-CM | POA: Diagnosis not present

## 2021-02-15 DIAGNOSIS — E1122 Type 2 diabetes mellitus with diabetic chronic kidney disease: Secondary | ICD-10-CM | POA: Diagnosis not present

## 2021-02-15 DIAGNOSIS — N183 Chronic kidney disease, stage 3 unspecified: Secondary | ICD-10-CM | POA: Diagnosis not present

## 2021-02-15 DIAGNOSIS — I272 Pulmonary hypertension, unspecified: Secondary | ICD-10-CM | POA: Diagnosis not present

## 2021-02-15 DIAGNOSIS — E1165 Type 2 diabetes mellitus with hyperglycemia: Secondary | ICD-10-CM | POA: Diagnosis not present

## 2021-02-16 ENCOUNTER — Other Ambulatory Visit: Payer: Self-pay

## 2021-02-16 ENCOUNTER — Other Ambulatory Visit (INDEPENDENT_AMBULATORY_CARE_PROVIDER_SITE_OTHER): Payer: Medicare Other

## 2021-02-16 DIAGNOSIS — E1129 Type 2 diabetes mellitus with other diabetic kidney complication: Secondary | ICD-10-CM

## 2021-02-16 DIAGNOSIS — E1165 Type 2 diabetes mellitus with hyperglycemia: Secondary | ICD-10-CM | POA: Diagnosis not present

## 2021-02-16 DIAGNOSIS — R809 Proteinuria, unspecified: Secondary | ICD-10-CM

## 2021-02-16 LAB — COMPREHENSIVE METABOLIC PANEL WITH GFR
ALT: 17 U/L (ref 0–35)
AST: 27 U/L (ref 0–37)
Albumin: 3.9 g/dL (ref 3.5–5.2)
Alkaline Phosphatase: 67 U/L (ref 39–117)
BUN: 34 mg/dL — ABNORMAL HIGH (ref 6–23)
CO2: 30 meq/L (ref 19–32)
Calcium: 9.5 mg/dL (ref 8.4–10.5)
Chloride: 104 meq/L (ref 96–112)
Creatinine, Ser: 1.22 mg/dL — ABNORMAL HIGH (ref 0.40–1.20)
GFR: 43.65 mL/min — ABNORMAL LOW
Glucose, Bld: 137 mg/dL — ABNORMAL HIGH (ref 70–99)
Potassium: 4.4 meq/L (ref 3.5–5.1)
Sodium: 141 meq/L (ref 135–145)
Total Bilirubin: 0.4 mg/dL (ref 0.2–1.2)
Total Protein: 7.3 g/dL (ref 6.0–8.3)

## 2021-02-16 LAB — HEMOGLOBIN A1C: Hgb A1c MFr Bld: 6.7 % — ABNORMAL HIGH (ref 4.6–6.5)

## 2021-02-16 LAB — LIPID PANEL
Cholesterol: 82 mg/dL (ref 0–200)
HDL: 33.6 mg/dL — ABNORMAL LOW
LDL Cholesterol: 32 mg/dL (ref 0–99)
NonHDL: 48.78
Total CHOL/HDL Ratio: 2
Triglycerides: 85 mg/dL (ref 0.0–149.0)
VLDL: 17 mg/dL (ref 0.0–40.0)

## 2021-02-16 LAB — MICROALBUMIN / CREATININE URINE RATIO
Creatinine,U: 95.1 mg/dL
Microalb Creat Ratio: 37.1 mg/g — ABNORMAL HIGH (ref 0.0–30.0)
Microalb, Ur: 35.3 mg/dL — ABNORMAL HIGH (ref 0.0–1.9)

## 2021-02-21 ENCOUNTER — Encounter: Payer: Self-pay | Admitting: Endocrinology

## 2021-02-21 ENCOUNTER — Other Ambulatory Visit: Payer: Self-pay

## 2021-02-21 ENCOUNTER — Ambulatory Visit (INDEPENDENT_AMBULATORY_CARE_PROVIDER_SITE_OTHER): Payer: Medicare Other | Admitting: Endocrinology

## 2021-02-21 VITALS — BP 142/84 | HR 53 | Ht 60.0 in | Wt 187.0 lb

## 2021-02-21 DIAGNOSIS — R809 Proteinuria, unspecified: Secondary | ICD-10-CM

## 2021-02-21 DIAGNOSIS — E1165 Type 2 diabetes mellitus with hyperglycemia: Secondary | ICD-10-CM

## 2021-02-21 DIAGNOSIS — E1129 Type 2 diabetes mellitus with other diabetic kidney complication: Secondary | ICD-10-CM

## 2021-02-21 DIAGNOSIS — I10 Essential (primary) hypertension: Secondary | ICD-10-CM | POA: Diagnosis not present

## 2021-02-21 NOTE — Progress Notes (Signed)
Patient ID: Shannon Kidd, female   DOB: 02-27-1946, 75 y.o.   MRN: [id number]           Reason for Appointment: Followup     Referring physician: Leighton Ruff  History of Present Illness:          Diagnosis: Type 2 diabetes mellitus, date of diagnosis: 2000        Past history: She used Metformin for her diabetes for several years until early 2015 and was taking 1g bid before it was stopped. She thinks her blood sugars are well controlled with this. Also at some point she was given Actos but this was stopped because of edema. Metformin was stopped in early 2015 on the suggestion of a nephrologist because of her renal dysfunction Since early 2015 she had been taking Januvia 50 mg daily When her blood sugars were not well controlled with A1c of 8% in 2015 she was started on low dose metformin to control fasting hyperglycemia  Recent history:   Oral hypoglycemic drugs the patient is taking are: Amaryl 1 mg at bedtime, 1000 mg metformin ER at bedtime     Current management, blood sugar patterns and problems identified:  Her A1c had gone up to 7.9 on her last visit and is now 6.7  She was last seen in 2/21  She was started on Rybelsus 3 mg in 05/2019  However subsequently she could not get her Rybelsus filled because of a higher co-pay  Her blood sugars are fluctuating and not clear if her readings are accurate since she had a reading as high as 497  Blood sugars are variable and may be higher in the last week or so  However she thinks that sometimes in the late afternoon she may feel hypoglycemic with blood sugars as low as 61, this is not happening consistently now  She appears to have lost some weight recently  She is taking Amaryl in the evening but continues to take it at bedtime; occasionally may have higher readings after supper also  Occasionally will have difficulties controlling carbohydrates and sweets  Today blood sugar was 212 fasting after going off her diet  last night  Her meter is programmed 6 hours behind actual time   Side effects from medications have been: Diarrhea from high-dose or regular metformin  Glucose monitoring:  done 1 or less times a day         Glucometer: Verio      Blood Glucose readings by download:   PRE-MEAL Fasting Lunch Dinner Bedtime Overall  Glucose range:  98-212     61-497  Mean/median:  132    150   POST-MEAL PC Breakfast PC Lunch PC Dinner  Glucose range:    105-237  Mean/median:    162     Self-care:      Meals:  2-3 meals per day.  Breakfast is a egg/toast      Dietician visit: Most recent: 8/15.               Weight history: 195-245 previously  Wt Readings from Last 3 Encounters:  02/21/21 187 lb (84.8 kg)  01/12/21 193 lb (87.5 kg)  11/17/19 197 lb 9.6 oz (89.6 kg)    Lab Results  Component Value Date   HGBA1C 6.7 (H) 02/16/2021   HGBA1C 7.3 06/02/2020   HGBA1C 7.8 (H) 11/12/2019   Lab Results  Component Value Date   MICROALBUR 35.3 (H) 02/16/2021   LDLCALC 32 02/16/2021  CREATININE 1.22 (H) 02/16/2021    Allergies as of 02/21/2021      Reactions   Actos [pioglitazone] Swelling      Medication List       Accurate as of Feb 21, 2021 10:27 AM. If you have any questions, ask your nurse or doctor.        albuterol 108 (90 Base) MCG/ACT inhaler Commonly known as: VENTOLIN HFA Inhale 2 puffs into the lungs every 6 (six) hours as needed for wheezing or shortness of breath.   amLODipine 5 MG tablet Commonly known as: NORVASC TAKE ONE TABLET BY MOUTH EVERY MORNING   aspirin 81 MG tablet Take 81 mg by mouth daily.   glimepiride 1 MG tablet Commonly known as: AMARYL Take one tablet daily at bedtime.-FOLLOW UP APPOINTMENT NEEDED FOR REFILLS   losartan 25 MG tablet Commonly known as: COZAAR TAKE ONE TABLET BY MOUTH EVERY MORNING   metFORMIN 500 MG 24 hr tablet Commonly known as: GLUCOPHAGE-XR TAKE TWO TABLETS BY MOUTH EVERY EVENING   OneTouch Delica Lancets 63S  Misc USE TO CHECK BLOOD SUGAR ONCE DAILY Dx code E11.9   OneTouch Verio IQ System w/Device Kit USE TO CHECK BLOOD SUGAR ONCE DAILY Dx code E11.9   OneTouch Verio test strip Generic drug: glucose blood USE 1 STRIP TO CHECK GLUCOSE ONCE DAILY   pramipexole 0.25 MG tablet Commonly known as: MIRAPEX Take 0.25 mg by mouth at bedtime.   rosuvastatin 20 MG tablet Commonly known as: CRESTOR TAKE ONE TABLET BY MOUTH EVERYDAY AT BEDTIME   sertraline 50 MG tablet Commonly known as: ZOLOFT Take 50 mg by mouth daily.   sulfamethoxazole-trimethoprim 800-160 MG tablet Commonly known as: BACTRIM DS Take 1 tablet by mouth 2 (two) times daily.   vitamin B-12 1000 MCG tablet Commonly known as: CYANOCOBALAMIN Take 1,000 mcg by mouth daily.   vitamin C 1000 MG tablet Take 1,000 mg by mouth daily.   Vitamin D 50 MCG (2000 UT) tablet Take 2,000 Units by mouth daily.       Allergies:  Allergies  Allergen Reactions  . Actos [Pioglitazone] Swelling    Past Medical History:  Diagnosis Date  . Anxiety   . Cervical myelopathy (HCC)    s/p cervical fusion @ [facility]  . Chronic diastolic congestive heart failure ([place])   . CKD (chronic kidney disease), stage III ([place])   . Diabetes mellitus ([place])   . Glaucoma   . HTN (hypertension)   . Hyperlipemia   . Morbid obesity ([place])   . Rheumatic fever   . S/P TAVR (transcatheter aortic valve replacement) 07/29/2018   26 mm Edwards Sapien 3 transcatheter heart valve placed via percutaneous right transfemoral approach   . Severe aortic stenosis     Past Surgical History:  Procedure Laterality Date  . APPENDECTOMY    . CARDIAC CATHETERIZATION    . EYE SURGERY Bilateral    cataract extraction bilateral with lens  . INTRAOPERATIVE TRANSTHORACIC ECHOCARDIOGRAM  07/29/2018   Procedure: INTRAOPERATIVE TRANSTHORACIC ECHOCARDIOGRAM;  Surgeon: Sherren Mocha, MD;  Location: [facility];  Service: Open Heart Surgery;;  . NECK SURGERY    . RIGHT/LEFT  HEART CATH AND CORONARY ANGIOGRAPHY N/A 06/04/2018   Procedure: RIGHT/LEFT HEART CATH AND CORONARY ANGIOGRAPHY;  Surgeon: Sherren Mocha, MD;  Location: [facility];  Service: Cardiovascular;  Laterality: N/A;  . TONSILLECTOMY    . TRANSCATHETER AORTIC VALVE REPLACEMENT, TRANSFEMORAL N/A 07/29/2018   Procedure: TRANSCATHETER AORTIC VALVE REPLACEMENT, TRANSFEMORAL;  Surgeon: Sherren Mocha,  MD;  Location: [facility];  Service: Open Heart Surgery;  Laterality: N/A;  . TUBAL LIGATION    . VAGINAL HYSTERECTOMY      Family History  Problem Relation Age of Onset  . Diabetes Mother   . Cancer Mother        Colon  . Diabetes Sister   . Diabetes Paternal Grandmother   . Breast cancer Maternal Grandmother 31    Social History:  reports that she has never smoked. She has never used smokeless tobacco. She reports that she does not drink alcohol and does not use drugs.    Review of Systems       Lipids: She has been treated with Crestor 20 mg by cardiologist HDL tends to be low Last LDL done by PCP was 23 done on 07/09/2019       Lab Results  Component Value Date   CHOL 82 02/16/2021   HDL 33.60 (L) 02/16/2021   LDLCALC 32 02/16/2021   TRIG 85.0 02/16/2021   CHOLHDL 2 02/16/2021                  The blood pressure has been treated with Norvasc 5 mg daily prescribed by her cardiologist Also taking 25 mg losartan because of microalbuminuria She is also followed by her PCP  Blood pressure readings at home:120-142/65-80   BP Readings from Last 3 Encounters:  02/21/21 (!) 142/84  01/12/21 140/60  11/17/19 (!) 142/62        Diabetic foot exam done in 5/22 with normal findings   RENAL dysfunction: Although her creatinine had gone up to 1.53 in the past it has been recently consistently normal  Also has had mild microalbuminuria persistently Previously with starting losartan 25 mg daily her microalbumin was back to normal but now mildly increased again    Lab Results   Component Value Date   CREATININE 1.22 (H) 02/16/2021   CREATININE 1.13 11/12/2019   CREATININE 1.10 07/27/2019    She is on vitamin D supplements which is followed by PCP  She has a recent UTI treated by her urologist   LABS:  Lab on 02/16/2021  Component Date Value Ref Range Status  . Cholesterol 02/16/2021 82  0 - 200 mg/dL Final   ATP III Classification       Desirable:  < 200 mg/dL               Borderline High:  200 - 239 mg/dL          High:  > = 240 mg/dL  . Triglycerides 02/16/2021 85.0  0.0 - 149.0 mg/dL Final   Normal:  <150 mg/dLBorderline High:  150 - 199 mg/dL  . HDL 02/16/2021 33.60* >39.00 mg/dL Final  . VLDL 02/16/2021 17.0  0.0 - 40.0 mg/dL Final  . LDL Cholesterol 02/16/2021 32  0 - 99 mg/dL Final  . Total CHOL/HDL Ratio 02/16/2021 2   Final                  Men          Women1/2 Average Risk     3.4          3.3Average Risk          5.0          4.42X Average Risk          9.6          7.13X Average Risk  15.0          11.0                      . NonHDL 02/16/2021 48.78   Final   NOTE:  Non-HDL goal should be 30 mg/dL higher than patient's LDL goal (i.e. LDL goal of < 70 mg/dL, would have non-HDL goal of < 100 mg/dL)  . Microalb, Ur 02/16/2021 35.3* 0.0 - 1.9 mg/dL Final  . Creatinine,U 02/16/2021 95.1  mg/dL Final  . Microalb Creat Ratio 02/16/2021 37.1* 0.0 - 30.0 mg/g Final  . Sodium 02/16/2021 141  135 - 145 mEq/L Final  . Potassium 02/16/2021 4.4  3.5 - 5.1 mEq/L Final  . Chloride 02/16/2021 104  96 - 112 mEq/L Final  . CO2 02/16/2021 30  19 - 32 mEq/L Final  . Glucose, Bld 02/16/2021 137* 70 - 99 mg/dL Final  . BUN 02/16/2021 34* 6 - 23 mg/dL Final  . Creatinine, Ser 02/16/2021 1.22* 0.40 - 1.20 mg/dL Final  . Total Bilirubin 02/16/2021 0.4  0.2 - 1.2 mg/dL Final  . Alkaline Phosphatase 02/16/2021 67  39 - 117 U/L Final  . AST 02/16/2021 27  0 - 37 U/L Final  . ALT 02/16/2021 17  0 - 35 U/L Final  . Total Protein 02/16/2021 7.3  6.0 - 8.3  g/dL Final  . Albumin 02/16/2021 3.9  3.5 - 5.2 g/dL Final  . GFR 02/16/2021 43.65* >60.00 mL/min Final   Calculated using the CKD-EPI Creatinine Equation (2021)  . Calcium 02/16/2021 9.5  8.4 - 10.5 mg/dL Final  . Hgb A1c MFr Bld 02/16/2021 6.7* 4.6 - 6.5 % Final   Glycemic Control Guidelines for People with Diabetes:Non Diabetic:  <6%Goal of Therapy: <7%Additional Action Suggested:  >8%     Physical Examination:  BP (!) 142/84   Pulse (!) 53   Ht 5' (1.524 m)   Wt 187 lb (84.8 kg)   SpO2 94%   BMI 36.52 kg/m   Repeat blood pressure with large cuff 140/72  Diabetic Foot Exam - Simple   Simple Foot Form Diabetic Foot exam was performed with the following findings: Yes   Visual Inspection No deformities, no ulcerations, no other skin breakdown bilaterally: Yes Sensation Testing Intact to touch and monofilament testing bilaterally: Yes Pulse Check Posterior Tibialis and Dorsalis pulse intact bilaterally: Yes See comments: Yes Comments Pulses not palpated on the left Trace ankle edema present      ASSESSMENT:  Diabetes type 2 with obesity  See history of present illness for discussion of current diabetes management, blood sugar patterns and problems identified  Her A1c is improved at 6.7  She has variable blood sugars, recently averaging about 150 at home More recently she has tried to do a little better with her diet and is losing weight but not able to do this consistently and has readings as high as 212 fasting Occasionally may have mild hypoglycemia symptoms in the afternoon  RENAL dysfunction: Mildly abnormal Also on losartan 25 mg  HYPERTENSION: Fair control although blood pressure is somewhat variable at home and she checks regularly with them meter that apparently transmits to her PCP   PLAN:  She will continue Amaryl 1 mg in the evenings but try to take it at dinnertime Continue metformin unchanged She will try to be more active as much as  possible Also cut back on carbohydrates and simple sugars New meter will be given today Needs more regular follow-up  Go up to 50 mg on losartan  for microalbuminuria and high normal blood pressure Will follow-up microalbumin on next visit  There are no Patient Instructions on file for this visit.   Elayne Snare 02/21/2021, 10:27 AM   Note: This office note was prepared with Dragon voice recognition system technology. Any transcriptional errors that result from this process are unintentional.

## 2021-02-21 NOTE — Patient Instructions (Signed)
Glimeperide at supper  Losartan 50mg  daily

## 2021-03-15 DIAGNOSIS — L02212 Cutaneous abscess of back [any part, except buttock]: Secondary | ICD-10-CM | POA: Diagnosis not present

## 2021-03-15 DIAGNOSIS — L03312 Cellulitis of back [any part except buttock]: Secondary | ICD-10-CM | POA: Diagnosis not present

## 2021-03-20 ENCOUNTER — Other Ambulatory Visit: Payer: Self-pay | Admitting: Cardiology

## 2021-03-20 DIAGNOSIS — I350 Nonrheumatic aortic (valve) stenosis: Secondary | ICD-10-CM

## 2021-03-24 DIAGNOSIS — L02212 Cutaneous abscess of back [any part, except buttock]: Secondary | ICD-10-CM | POA: Diagnosis not present

## 2021-03-31 DIAGNOSIS — E1165 Type 2 diabetes mellitus with hyperglycemia: Secondary | ICD-10-CM | POA: Diagnosis not present

## 2021-03-31 DIAGNOSIS — M8580 Other specified disorders of bone density and structure, unspecified site: Secondary | ICD-10-CM | POA: Diagnosis not present

## 2021-03-31 DIAGNOSIS — N183 Chronic kidney disease, stage 3 unspecified: Secondary | ICD-10-CM | POA: Diagnosis not present

## 2021-03-31 DIAGNOSIS — E7800 Pure hypercholesterolemia, unspecified: Secondary | ICD-10-CM | POA: Diagnosis not present

## 2021-03-31 DIAGNOSIS — I272 Pulmonary hypertension, unspecified: Secondary | ICD-10-CM | POA: Diagnosis not present

## 2021-03-31 DIAGNOSIS — E1122 Type 2 diabetes mellitus with diabetic chronic kidney disease: Secondary | ICD-10-CM | POA: Diagnosis not present

## 2021-03-31 DIAGNOSIS — I10 Essential (primary) hypertension: Secondary | ICD-10-CM | POA: Diagnosis not present

## 2021-04-17 ENCOUNTER — Other Ambulatory Visit: Payer: Self-pay | Admitting: Endocrinology

## 2021-04-17 DIAGNOSIS — E1165 Type 2 diabetes mellitus with hyperglycemia: Secondary | ICD-10-CM

## 2021-04-21 ENCOUNTER — Encounter: Payer: Self-pay | Admitting: Podiatry

## 2021-04-21 ENCOUNTER — Ambulatory Visit (INDEPENDENT_AMBULATORY_CARE_PROVIDER_SITE_OTHER): Payer: Medicare Other | Admitting: Podiatry

## 2021-04-21 ENCOUNTER — Other Ambulatory Visit: Payer: Self-pay

## 2021-04-21 DIAGNOSIS — L601 Onycholysis: Secondary | ICD-10-CM | POA: Diagnosis not present

## 2021-04-21 DIAGNOSIS — B351 Tinea unguium: Secondary | ICD-10-CM

## 2021-04-21 DIAGNOSIS — M79609 Pain in unspecified limb: Secondary | ICD-10-CM | POA: Diagnosis not present

## 2021-04-23 NOTE — Progress Notes (Signed)
Subjective: Shannon Kidd is a pleasant 75 y.o. female patient seen today for diabetic foot care. She has painful thick toenails that are difficult to trim. Pain interferes with ambulation. Aggravating factors include wearing enclosed shoe gear. Pain is relieved with periodic professional debridement.  PCP is Rankins, Fanny Dance, MD. Last visit was: 11/18/2020. She also sees Dr. Lucianne Muss for her diabetes and last visit was 02/21/2021.  Patient states right great toenail was loose and her daughter trimmed it a bit because it was lifting up. She would like remaining portion of toenail removed.  Allergies  Allergen Reactions   Actos [Pioglitazone] Swelling    Objective: Physical Exam  General: Shannon Kidd is a pleasant 75 y.o. Caucasian female, in NAD. AAO x 3.   Vascular:  Capillary refill time to digits immediate b/l. Palpable pedal pulses b/l LE. Pedal hair present. Lower extremity skin temperature gradient within normal limits. No pain with calf compression b/l. No edema noted b/l lower extremities.  Dermatological:  Pedal skin with normal turgor, texture and tone b/l lower extremities. No open wounds b/l lower extremities. No interdigital macerations b/l lower extremities. Toenails 2-5 bilaterally and L hallux elongated, discolored, dystrophic, thickened, and crumbly with subungual debris and tenderness to dorsal palpation. Right hallux devoid of lateral half of toenail. There is noted onchyolysis of medial aspect of R hallux.  The nailbeds remains intact. There is no erythema, no edema, no drainage, no underlying fluctuance.  Musculoskeletal:  Normal muscle strength 5/5 to all lower extremity muscle groups bilaterally. No pain crepitus or joint limitation noted with ROM b/l. No gross bony deformities bilaterally.  Neurological:  Protective sensation intact 5/5 intact bilaterally with 10g monofilament b/l. Vibratory sensation intact b/l. Proprioception intact bilaterally.  Assessment  and Plan:  1. Pain due to onychomycosis of nail   2. Onycholysis of toenail     -Examined patient. -Patient to continue soft, supportive shoe gear daily. -With no need for local anesthesia, remaining 1/2 of right great toe nailplate gently debrided from it's remaining attachment to digit. Nailbed cleansed with alcohol. Triple antibiotic ointment applied. She was instructed to apply Neosporin to digit once daily -Toenails 2-5 bilaterally and L hallux debrided in length and girth without iatrogenic bleeding with sterile nail nipper and dremel.  -Patient to report any pedal injuries to medical professional immediately. -Patient/POA to call should there be question/concern in the interim.  Return in about 3 months (around 07/22/2021).  Freddie Breech, DPM

## 2021-04-25 DIAGNOSIS — I10 Essential (primary) hypertension: Secondary | ICD-10-CM | POA: Diagnosis not present

## 2021-04-25 DIAGNOSIS — E1165 Type 2 diabetes mellitus with hyperglycemia: Secondary | ICD-10-CM | POA: Diagnosis not present

## 2021-04-25 DIAGNOSIS — E1122 Type 2 diabetes mellitus with diabetic chronic kidney disease: Secondary | ICD-10-CM | POA: Diagnosis not present

## 2021-04-25 DIAGNOSIS — N183 Chronic kidney disease, stage 3 unspecified: Secondary | ICD-10-CM | POA: Diagnosis not present

## 2021-04-25 DIAGNOSIS — I272 Pulmonary hypertension, unspecified: Secondary | ICD-10-CM | POA: Diagnosis not present

## 2021-04-25 DIAGNOSIS — E7800 Pure hypercholesterolemia, unspecified: Secondary | ICD-10-CM | POA: Diagnosis not present

## 2021-04-25 DIAGNOSIS — M8580 Other specified disorders of bone density and structure, unspecified site: Secondary | ICD-10-CM | POA: Diagnosis not present

## 2021-05-05 DIAGNOSIS — I10 Essential (primary) hypertension: Secondary | ICD-10-CM | POA: Diagnosis not present

## 2021-05-18 ENCOUNTER — Other Ambulatory Visit: Payer: Self-pay | Admitting: Cardiology

## 2021-05-22 DIAGNOSIS — I272 Pulmonary hypertension, unspecified: Secondary | ICD-10-CM | POA: Diagnosis not present

## 2021-05-22 DIAGNOSIS — E7800 Pure hypercholesterolemia, unspecified: Secondary | ICD-10-CM | POA: Diagnosis not present

## 2021-05-22 DIAGNOSIS — M8580 Other specified disorders of bone density and structure, unspecified site: Secondary | ICD-10-CM | POA: Diagnosis not present

## 2021-05-22 DIAGNOSIS — E1122 Type 2 diabetes mellitus with diabetic chronic kidney disease: Secondary | ICD-10-CM | POA: Diagnosis not present

## 2021-05-22 DIAGNOSIS — I10 Essential (primary) hypertension: Secondary | ICD-10-CM | POA: Diagnosis not present

## 2021-05-22 DIAGNOSIS — E1165 Type 2 diabetes mellitus with hyperglycemia: Secondary | ICD-10-CM | POA: Diagnosis not present

## 2021-05-22 DIAGNOSIS — N183 Chronic kidney disease, stage 3 unspecified: Secondary | ICD-10-CM | POA: Diagnosis not present

## 2021-06-02 ENCOUNTER — Telehealth: Payer: Self-pay | Admitting: Cardiology

## 2021-06-02 NOTE — Telephone Encounter (Signed)
Spoke with pt son, Aware of dr crenshaw's recommendations.  

## 2021-06-02 NOTE — Telephone Encounter (Signed)
Spoke with pt's son. He state pt's endocrinologist recently increased pt's losartan to 50 mg daily in July and wanted to make sure it was ok with Dr. Jens Som. He state he wasn't aware that an endocrinologist could make BP medication changes.He report  BP has been ranging from 121-145. Son also state he is concerned because pt's HR is averaging between 51-55 at rest and she's been feeling a little dizzy when she stand. He report pt has to stand for a few minutes prior to walking.  Will forward to MD to make aware. Pt also made aware of ED precaution should any new symptoms develop or worsen.

## 2021-06-02 NOTE — Telephone Encounter (Signed)
STAT if HR is under 50 or over 120 (normal HR is 60-100 beats per minute)  What is your heart rate? 55,54, 51,56,  Do you have a log of your heart rate readings (document readings)? yes  Do you have any other symptoms? Bloated and tired    Patient's Endocrinologist increased her Losartan to 50 mg in July. She wants to see if Dr Stanford Breed thinks this is alright

## 2021-06-05 DIAGNOSIS — G2581 Restless legs syndrome: Secondary | ICD-10-CM | POA: Diagnosis not present

## 2021-06-05 DIAGNOSIS — E1165 Type 2 diabetes mellitus with hyperglycemia: Secondary | ICD-10-CM | POA: Diagnosis not present

## 2021-06-05 DIAGNOSIS — N183 Chronic kidney disease, stage 3 unspecified: Secondary | ICD-10-CM | POA: Diagnosis not present

## 2021-06-05 DIAGNOSIS — Z0000 Encounter for general adult medical examination without abnormal findings: Secondary | ICD-10-CM | POA: Diagnosis not present

## 2021-06-05 DIAGNOSIS — I10 Essential (primary) hypertension: Secondary | ICD-10-CM | POA: Diagnosis not present

## 2021-06-05 DIAGNOSIS — E7800 Pure hypercholesterolemia, unspecified: Secondary | ICD-10-CM | POA: Diagnosis not present

## 2021-06-05 DIAGNOSIS — M8580 Other specified disorders of bone density and structure, unspecified site: Secondary | ICD-10-CM | POA: Diagnosis not present

## 2021-06-06 DIAGNOSIS — I10 Essential (primary) hypertension: Secondary | ICD-10-CM | POA: Diagnosis not present

## 2021-06-12 ENCOUNTER — Other Ambulatory Visit: Payer: Self-pay | Admitting: Endocrinology

## 2021-06-12 DIAGNOSIS — E1165 Type 2 diabetes mellitus with hyperglycemia: Secondary | ICD-10-CM

## 2021-06-20 ENCOUNTER — Telehealth: Payer: Self-pay | Admitting: Endocrinology

## 2021-06-20 NOTE — Telephone Encounter (Signed)
Pt is needing a refill on    ONETOUCH VERIO test strip  [facility] in [place] on [address]  [address] [place], [place], [place] [address]

## 2021-06-21 ENCOUNTER — Other Ambulatory Visit: Payer: Self-pay | Admitting: Endocrinology

## 2021-06-21 DIAGNOSIS — E7800 Pure hypercholesterolemia, unspecified: Secondary | ICD-10-CM | POA: Diagnosis not present

## 2021-06-21 DIAGNOSIS — M8580 Other specified disorders of bone density and structure, unspecified site: Secondary | ICD-10-CM | POA: Diagnosis not present

## 2021-06-21 DIAGNOSIS — N183 Chronic kidney disease, stage 3 unspecified: Secondary | ICD-10-CM | POA: Diagnosis not present

## 2021-06-21 DIAGNOSIS — E1165 Type 2 diabetes mellitus with hyperglycemia: Secondary | ICD-10-CM | POA: Diagnosis not present

## 2021-06-21 DIAGNOSIS — I272 Pulmonary hypertension, unspecified: Secondary | ICD-10-CM | POA: Diagnosis not present

## 2021-06-21 DIAGNOSIS — I10 Essential (primary) hypertension: Secondary | ICD-10-CM | POA: Diagnosis not present

## 2021-06-21 DIAGNOSIS — E1122 Type 2 diabetes mellitus with diabetic chronic kidney disease: Secondary | ICD-10-CM | POA: Diagnosis not present

## 2021-06-27 ENCOUNTER — Other Ambulatory Visit: Payer: Medicare Other

## 2021-06-28 ENCOUNTER — Other Ambulatory Visit: Payer: Self-pay

## 2021-06-28 ENCOUNTER — Other Ambulatory Visit (INDEPENDENT_AMBULATORY_CARE_PROVIDER_SITE_OTHER): Payer: Medicare Other

## 2021-06-28 DIAGNOSIS — E1129 Type 2 diabetes mellitus with other diabetic kidney complication: Secondary | ICD-10-CM

## 2021-06-28 DIAGNOSIS — R809 Proteinuria, unspecified: Secondary | ICD-10-CM

## 2021-06-28 DIAGNOSIS — E1165 Type 2 diabetes mellitus with hyperglycemia: Secondary | ICD-10-CM

## 2021-06-28 LAB — BASIC METABOLIC PANEL WITH GFR
BUN: 26 mg/dL — ABNORMAL HIGH (ref 6–23)
CO2: 28 meq/L (ref 19–32)
Calcium: 9 mg/dL (ref 8.4–10.5)
Chloride: 106 meq/L (ref 96–112)
Creatinine, Ser: 1.22 mg/dL — ABNORMAL HIGH (ref 0.40–1.20)
GFR: 43.54 mL/min — ABNORMAL LOW
Glucose, Bld: 98 mg/dL (ref 70–99)
Potassium: 4.8 meq/L (ref 3.5–5.1)
Sodium: 140 meq/L (ref 135–145)

## 2021-06-28 LAB — MICROALBUMIN / CREATININE URINE RATIO
Creatinine,U: 64 mg/dL
Microalb Creat Ratio: 44.7 mg/g — ABNORMAL HIGH (ref 0.0–30.0)
Microalb, Ur: 28.6 mg/dL — ABNORMAL HIGH (ref 0.0–1.9)

## 2021-06-28 LAB — HEMOGLOBIN A1C: Hgb A1c MFr Bld: 6.6 % — ABNORMAL HIGH (ref 4.6–6.5)

## 2021-06-29 ENCOUNTER — Ambulatory Visit (INDEPENDENT_AMBULATORY_CARE_PROVIDER_SITE_OTHER): Payer: Medicare Other | Admitting: Endocrinology

## 2021-06-29 ENCOUNTER — Encounter: Payer: Self-pay | Admitting: Endocrinology

## 2021-06-29 VITALS — BP 140/66 | HR 56 | Ht 60.0 in | Wt 191.8 lb

## 2021-06-29 DIAGNOSIS — E1165 Type 2 diabetes mellitus with hyperglycemia: Secondary | ICD-10-CM

## 2021-06-29 DIAGNOSIS — I10 Essential (primary) hypertension: Secondary | ICD-10-CM

## 2021-06-29 DIAGNOSIS — Z23 Encounter for immunization: Secondary | ICD-10-CM | POA: Diagnosis not present

## 2021-06-29 NOTE — Progress Notes (Signed)
Patient ID: Shannon Kidd, female   DOB: [birth year], 75 y.o.   MRN: [id number]           Reason for Appointment: Followup    History of Present Illness:          Diagnosis: Type 2 diabetes mellitus, date of diagnosis: 2000        Past history: She used Metformin for her diabetes for several years until early 2015 and was taking 1g bid before it was stopped. She thinks her blood sugars are well controlled with this. Also at some point she was given Actos but this was stopped because of edema. Metformin was stopped in early 2015 on the suggestion of a nephrologist because of her renal dysfunction Since early 2015 she had been taking Januvia 50 mg daily When her blood sugars were not well controlled with A1c of 8% in 2015 she was started on low dose metformin to control fasting hyperglycemia  Recent history:   Oral hypoglycemic drugs the patient is taking are: Amaryl 1 mg at dinnertime, 1000 mg metformin ER at bedtime     Current management, blood sugar patterns and problems identified: Her A1c is consistently at 6.6 now  She has been back on Amaryl since Rybelsus was too expensive Her 1 mg dose was changed to dinnertime because of high readings after supper previously at times However has not checked blood sugars after dinner lately had only once  Overall her fasting readings are however better than before  Weight is slightly higher  No hypoglycemic symptoms.  She did have a blood sugar of 35 without symptoms in the afternoon but she checked it right afterwards and it was 75 the second time   Side effects from medications have been: Diarrhea from high-dose or regular metformin  Glucose monitoring:  done 1 or less times a day         Glucometer: Verio      Blood Glucose readings by download:   PRE-MEAL Fasting Lunch Dinner Bedtime Overall  Glucose range:     75-156  Mean/median: 118    111   POST-MEAL PC Breakfast PC Lunch PC Dinner  Glucose range: 156    Mean/median:      Prior:  PRE-MEAL Fasting Lunch Dinner Bedtime Overall  Glucose range: 96-132   140   Mean/median: 120    119   POST-MEAL PC Breakfast PC Lunch PC Dinner  Glucose range:   ?  Mean/median:       Self-care:      Meals:  2-3 meals per day.  Breakfast is a egg/toast      Dietician visit: Most recent: 8/15.               Weight history: 195-245 previously  Wt Readings from Last 3 Encounters:  06/29/21 191 lb 12.8 oz (87 kg)  02/21/21 187 lb (84.8 kg)  01/12/21 193 lb (87.5 kg)    Lab Results  Component Value Date   HGBA1C 6.6 (H) 06/28/2021   HGBA1C 6.7 (H) 02/16/2021   HGBA1C 7.3 06/02/2020   Lab Results  Component Value Date   MICROALBUR 28.6 (H) 06/28/2021   LDLCALC 32 02/16/2021   CREATININE 1.22 (H) 06/28/2021    Allergies as of 06/29/2021       Reactions   Actos [pioglitazone] Swelling        Medication List        Accurate as of June 29, 2021 12:25 PM. If you have any questions,  ask your nurse or doctor.          STOP taking these medications    amoxicillin-clavulanate 500-125 MG tablet Commonly known as: AUGMENTIN Stopped by: Elayne Snare, MD   cephALEXin 500 MG capsule Commonly known as: KEFLEX Stopped by: Elayne Snare, MD   sulfamethoxazole-trimethoprim 800-160 MG tablet Commonly known as: BACTRIM DS Stopped by: Elayne Snare, MD       TAKE these medications    albuterol 108 (90 Base) MCG/ACT inhaler Commonly known as: VENTOLIN HFA Inhale 2 puffs into the lungs every 6 (six) hours as needed for wheezing or shortness of breath.   amLODipine 5 MG tablet Commonly known as: NORVASC TAKE ONE TABLET BY MOUTH EVERY MORNING   aspirin 81 MG tablet Take 81 mg by mouth daily.   glimepiride 1 MG tablet Commonly known as: AMARYL Take one tablet daily at bedtime.-FOLLOW UP APPOINTMENT NEEDED FOR REFILLS   losartan 50 MG tablet Commonly known as: COZAAR Take 50 mg by mouth daily. What changed: Another medication with the same name was  removed. Continue taking this medication, and follow the directions you see here. Changed by: Elayne Snare, MD   metFORMIN 500 MG 24 hr tablet Commonly known as: GLUCOPHAGE-XR TAKE TWO TABLETS BY MOUTH EVERY EVENING   OneTouch Delica Lancets 49F Misc USE TO CHECK BLOOD SUGAR ONCE DAILY Dx code E11.9   OneTouch Verio IQ System w/Device Kit USE TO CHECK BLOOD SUGAR ONCE DAILY Dx code E11.9   OneTouch Verio test strip Generic drug: glucose blood USE 1 STRIP TO CHECK GLUCOSE ONCE DAILY   pramipexole 0.25 MG tablet Commonly known as: MIRAPEX Take 0.25 mg by mouth at bedtime.   rosuvastatin 20 MG tablet Commonly known as: CRESTOR TAKE ONE TABLET BY MOUTH EVERYDAY AT BEDTIME   sertraline 50 MG tablet Commonly known as: ZOLOFT Take 50 mg by mouth daily.   vitamin B-12 1000 MCG tablet Commonly known as: CYANOCOBALAMIN Take 1,000 mcg by mouth daily.   vitamin C 1000 MG tablet Take 1,000 mg by mouth daily.   Vitamin D 50 MCG (2000 UT) tablet Take 2,000 Units by mouth daily.        Allergies:  Allergies  Allergen Reactions   Actos [Pioglitazone] Swelling    Past Medical History:  Diagnosis Date   Anxiety    Cervical myelopathy (HCC)    s/p cervical fusion @ [facility]   Chronic diastolic congestive heart failure (HCC)    CKD (chronic kidney disease), stage III (HCC)    Diabetes mellitus ([place])    Glaucoma    HTN (hypertension)    Hyperlipemia    Morbid obesity (HCC)    Rheumatic fever    S/P TAVR (transcatheter aortic valve replacement) 07/29/2018   26 mm Edwards Sapien 3 transcatheter heart valve placed via percutaneous right transfemoral approach    Severe aortic stenosis     Past Surgical History:  Procedure Laterality Date   APPENDECTOMY     CARDIAC CATHETERIZATION     EYE SURGERY Bilateral    cataract extraction bilateral with lens   INTRAOPERATIVE TRANSTHORACIC ECHOCARDIOGRAM  07/29/2018   Procedure: INTRAOPERATIVE TRANSTHORACIC ECHOCARDIOGRAM;   Surgeon: Sherren Mocha, MD;  Location: [facility];  Service: Open Heart Surgery;;   NECK SURGERY     RIGHT/LEFT HEART CATH AND CORONARY ANGIOGRAPHY N/A 06/04/2018   Procedure: RIGHT/LEFT HEART CATH AND CORONARY ANGIOGRAPHY;  Surgeon: Sherren Mocha, MD;  Location: [place] [facility];  Service: Cardiovascular;  Laterality: N/A;   TONSILLECTOMY  TRANSCATHETER AORTIC VALVE REPLACEMENT, TRANSFEMORAL N/A 07/29/2018   Procedure: TRANSCATHETER AORTIC VALVE REPLACEMENT, TRANSFEMORAL;  Surgeon: Sherren Mocha, MD;  Location: [place];  Service: Open Heart Surgery;  Laterality: N/A;   TUBAL LIGATION     VAGINAL HYSTERECTOMY      Family History  Problem Relation Age of Onset   Diabetes Mother    Cancer Mother        Colon   Diabetes Sister    Diabetes Paternal Grandmother    Breast cancer Maternal Grandmother 71    Social History:  reports that she has never smoked. She has never used smokeless tobacco. She reports that she does not drink alcohol and does not use drugs.    Review of Systems       Lipids: She has been treated with Crestor 20 mg by cardiologist HDL tends to be low Last LDL done by PCP was 23 done on 07/09/2019       Lab Results  Component Value Date   CHOL 82 02/16/2021   HDL 33.60 (L) 02/16/2021   LDLCALC 32 02/16/2021   TRIG 85.0 02/16/2021   CHOLHDL 2 02/16/2021                  The blood pressure has been treated with Norvasc 5 mg daily prescribed by her cardiologist Now taking 50 mg of losartan for blood pressure control However her microalbumin is still mildly increased  She is also followed by her PCP  Blood pressure readings at home appear to fluctuate although seem to be higher frequently: Recent range 129--167/70-73   BP Readings from Last 3 Encounters:  06/29/21 140/66  02/21/21 (!) 142/84  01/12/21 140/60        Diabetic foot exam done in 5/22 with normal findings   RENAL dysfunction: Although her creatinine had gone up to 1.53 in the past it has  been recently consistently normal  Also has had mild microalbuminuria persistently    Lab Results  Component Value Date   CREATININE 1.22 (H) 06/28/2021   CREATININE 1.22 (H) 02/16/2021   CREATININE 1.13 11/12/2019    She is on vitamin D supplements which is followed by PCP   LABS:  Lab on 06/28/2021  Component Date Value Ref Range Status   Microalb, Ur 06/28/2021 28.6 (A) 0.0 - 1.9 mg/dL Final   Creatinine,U 06/28/2021 64.0  mg/dL Final   Microalb Creat Ratio 06/28/2021 44.7 (A) 0.0 - 30.0 mg/g Final   Sodium 06/28/2021 140  135 - 145 mEq/L Final   Potassium 06/28/2021 4.8  3.5 - 5.1 mEq/L Final   Chloride 06/28/2021 106  96 - 112 mEq/L Final   CO2 06/28/2021 28  19 - 32 mEq/L Final   Glucose, Bld 06/28/2021 98  70 - 99 mg/dL Final   BUN 06/28/2021 26 (A) 6 - 23 mg/dL Final   Creatinine, Ser 06/28/2021 1.22 (A) 0.40 - 1.20 mg/dL Final   GFR 06/28/2021 43.54 (A) >60.00 mL/min Final   Calculated using the CKD-EPI Creatinine Equation (2021)   Calcium 06/28/2021 9.0  8.4 - 10.5 mg/dL Final   Hgb A1c MFr Bld 06/28/2021 6.6 (A) 4.6 - 6.5 % Final   Glycemic Control Guidelines for People with Diabetes:Non Diabetic:  <6%Goal of Therapy: <7%Additional Action Suggested:  >8%     Physical Examination:  BP 140/66   Pulse (!) 56   Ht 5' (1.524 m)   Wt 191 lb 12.8 oz (87 kg)   SpO2 97%   BMI 37.46  kg/m    ASSESSMENT:  Diabetes type 2 with obesity  See history of present illness for discussion of current diabetes management, blood sugar patterns and problems identified  Her A1c is consistently fairly good at 6.6  She has generally better blood sugars although not checked enough after meals Fasting readings are somewhat better Also no hypoglycemia with Amaryl, likely had one falsely low reading in the afternoon  RENAL dysfunction: Mildly abnormal but stable even with increasing losartan to 50 mg Also still has some microalbuminuria  HYPERTENSION: Fair control although  blood pressure is better on the second measurement standing up  Advised her to focus more on the systolic rather than diastolic readings   PLAN:  She will continue Amaryl 1 mg in the evenings as also metformin as before She will check more blood sugars after dinner and she will record some readings after lunch also  Discussed the 2-hour blood sugar targets Discussed need for weight loss with cutting back on calories and trying to the walking as much as tolerated  Recheck A1c in 4 months  Continue 50 mg losartan  There are no Patient Instructions on file for this visit.   Elayne Snare 06/29/2021, 12:25 PM   Note: This office note was prepared with Dragon voice recognition system technology. Any transcriptional errors that result from this process are unintentional.

## 2021-07-05 ENCOUNTER — Other Ambulatory Visit: Payer: Self-pay | Admitting: *Deleted

## 2021-07-05 DIAGNOSIS — I6523 Occlusion and stenosis of bilateral carotid arteries: Secondary | ICD-10-CM

## 2021-07-06 DIAGNOSIS — I10 Essential (primary) hypertension: Secondary | ICD-10-CM | POA: Diagnosis not present

## 2021-07-13 ENCOUNTER — Other Ambulatory Visit: Payer: Self-pay | Admitting: Endocrinology

## 2021-07-13 DIAGNOSIS — E1165 Type 2 diabetes mellitus with hyperglycemia: Secondary | ICD-10-CM

## 2021-07-19 DIAGNOSIS — I10 Essential (primary) hypertension: Secondary | ICD-10-CM | POA: Diagnosis not present

## 2021-07-19 DIAGNOSIS — E7800 Pure hypercholesterolemia, unspecified: Secondary | ICD-10-CM | POA: Diagnosis not present

## 2021-07-19 DIAGNOSIS — E1165 Type 2 diabetes mellitus with hyperglycemia: Secondary | ICD-10-CM | POA: Diagnosis not present

## 2021-07-19 DIAGNOSIS — N183 Chronic kidney disease, stage 3 unspecified: Secondary | ICD-10-CM | POA: Diagnosis not present

## 2021-07-19 DIAGNOSIS — E1122 Type 2 diabetes mellitus with diabetic chronic kidney disease: Secondary | ICD-10-CM | POA: Diagnosis not present

## 2021-07-19 DIAGNOSIS — M8580 Other specified disorders of bone density and structure, unspecified site: Secondary | ICD-10-CM | POA: Diagnosis not present

## 2021-07-19 DIAGNOSIS — I272 Pulmonary hypertension, unspecified: Secondary | ICD-10-CM | POA: Diagnosis not present

## 2021-07-27 ENCOUNTER — Other Ambulatory Visit: Payer: Self-pay | Admitting: Endocrinology

## 2021-07-28 ENCOUNTER — Ambulatory Visit: Payer: Medicare Other | Admitting: Podiatry

## 2021-07-31 ENCOUNTER — Other Ambulatory Visit: Payer: Self-pay

## 2021-07-31 ENCOUNTER — Ambulatory Visit (HOSPITAL_COMMUNITY)
Admission: RE | Admit: 2021-07-31 | Discharge: 2021-07-31 | Disposition: A | Discharge status: Home / Self Care | Payer: Medicare Other | Source: Ambulatory Visit | Attending: Cardiology | Admitting: Cardiology

## 2021-07-31 DIAGNOSIS — I6523 Occlusion and stenosis of bilateral carotid arteries: Secondary | ICD-10-CM | POA: Diagnosis present

## 2021-08-04 ENCOUNTER — Encounter: Payer: Self-pay | Admitting: *Deleted

## 2021-08-07 ENCOUNTER — Telehealth: Payer: Self-pay | Admitting: Endocrinology

## 2021-08-07 DIAGNOSIS — I10 Essential (primary) hypertension: Secondary | ICD-10-CM | POA: Diagnosis not present

## 2021-08-07 NOTE — Telephone Encounter (Signed)
Shannon Kidd with [name] called re: blood pressure readings for Patient Shannon Kidd states he will fax the above mentioned readings to fax# [phone])

## 2021-08-08 ENCOUNTER — Telehealth: Payer: Self-pay | Admitting: Endocrinology

## 2021-08-08 NOTE — Telephone Encounter (Signed)
Dr Lucianne Muss received readings and has messaged in a different encounter.

## 2021-08-08 NOTE — Telephone Encounter (Signed)
Received blood pressure readings from [facility].  Blood pressure is intermittently up to 160 but not consistently.  Would recommend that she make an appointment with either her PCP or cardiologist for adjustment of blood pressure medications.  Let patient know, will also fax [facility] [place]

## 2021-08-13 ENCOUNTER — Other Ambulatory Visit: Payer: Self-pay | Admitting: Endocrinology

## 2021-08-13 DIAGNOSIS — E1165 Type 2 diabetes mellitus with hyperglycemia: Secondary | ICD-10-CM

## 2021-08-17 DIAGNOSIS — E7800 Pure hypercholesterolemia, unspecified: Secondary | ICD-10-CM | POA: Diagnosis not present

## 2021-08-17 DIAGNOSIS — E1165 Type 2 diabetes mellitus with hyperglycemia: Secondary | ICD-10-CM | POA: Diagnosis not present

## 2021-08-17 DIAGNOSIS — I272 Pulmonary hypertension, unspecified: Secondary | ICD-10-CM | POA: Diagnosis not present

## 2021-08-17 DIAGNOSIS — E1122 Type 2 diabetes mellitus with diabetic chronic kidney disease: Secondary | ICD-10-CM | POA: Diagnosis not present

## 2021-08-17 DIAGNOSIS — N183 Chronic kidney disease, stage 3 unspecified: Secondary | ICD-10-CM | POA: Diagnosis not present

## 2021-08-17 DIAGNOSIS — I10 Essential (primary) hypertension: Secondary | ICD-10-CM | POA: Diagnosis not present

## 2021-08-17 DIAGNOSIS — M8580 Other specified disorders of bone density and structure, unspecified site: Secondary | ICD-10-CM | POA: Diagnosis not present

## 2021-08-21 NOTE — Progress Notes (Signed)
Virtual Visit via Video Note changed to phone visit at patient request.   This visit type was conducted due to national recommendations for restrictions regarding the COVID-19 Pandemic (e.g. social distancing) in an effort to limit this patient's exposure and mitigate transmission in our community.  Due to her co-morbid illnesses, this patient is at least at moderate risk for complications without adequate follow up.  This format is felt to be most appropriate for this patient at this time.  All issues noted in this document were discussed and addressed.  A limited physical exam was performed with this format.  Please refer to the patient's chart for her consent to telehealth for [facility].      Date:  08/22/2021   ID:  Shannon Kidd, DOB [birth year], MRN [id number]  Patient Location:Home Provider Location: Home  PCP:  Eilene Ghazi, NP  Cardiologist:  Dr Stanford Breed  Evaluation Performed:  Follow-Up Visit  Chief Complaint:  FU AVR  History of Present Illness:    FU AS s/p TAVR. Patient had follow-up echocardiogram August 2019 that showed normal LV function and severe aortic stenosis with mean gradient 45 mmHg. There was moderate tricuspid regurgitation. Cardiac catheterization August 2019 showed calcified coronaries but only mild nonobstructive disease. There was severe aortic stenosis and moderate pulmonary hypertension felt secondary to elevated left heart pressures. Had TAVR October 2019. Echocardiogram repeated October 2020. Normal LV function, stable aortic valve replacement with mean gradient 11 mmHg and no aortic insufficiency. Carotid Dopplers October 2022 showed 1 to 39% bilateral stenosis. Since last seen she has some dyspnea on exertion but no orthopnea, PND, chest pain, palpitations.  Occasional mild pedal edema.  She occasionally feels dizzy for approximately 5 minutes not related to position.  She has not had syncope.  The patient does not have symptoms concerning for  COVID-19 infection (fever, chills, cough, or new shortness of breath).    Past Medical History:  Diagnosis Date   Anxiety    Cervical myelopathy (HCC)    s/p cervical fusion @ [facility]   Chronic diastolic congestive heart failure (HCC)    CKD (chronic kidney disease), stage III (HCC)    Diabetes mellitus ([place])    Glaucoma    HTN (hypertension)    Hyperlipemia    Morbid obesity (HCC)    Rheumatic fever    S/P TAVR (transcatheter aortic valve replacement) 07/29/2018   26 mm Edwards Sapien 3 transcatheter heart valve placed via percutaneous right transfemoral approach    Severe aortic stenosis    Past Surgical History:  Procedure Laterality Date   APPENDECTOMY     CARDIAC CATHETERIZATION     EYE SURGERY Bilateral    cataract extraction bilateral with lens   INTRAOPERATIVE TRANSTHORACIC ECHOCARDIOGRAM  07/29/2018   Procedure: INTRAOPERATIVE TRANSTHORACIC ECHOCARDIOGRAM;  Surgeon: Sherren Mocha, MD;  Location: [place];  Service: Open Heart Surgery;;   NECK SURGERY     RIGHT/LEFT HEART CATH AND CORONARY ANGIOGRAPHY N/A 06/04/2018   Procedure: RIGHT/LEFT HEART CATH AND CORONARY ANGIOGRAPHY;  Surgeon: Sherren Mocha, MD;  Location: [place] [facility];  Service: Cardiovascular;  Laterality: N/A;   TONSILLECTOMY     TRANSCATHETER AORTIC VALVE REPLACEMENT, TRANSFEMORAL N/A 07/29/2018   Procedure: TRANSCATHETER AORTIC VALVE REPLACEMENT, TRANSFEMORAL;  Surgeon: Sherren Mocha, MD;  Location: [place];  Service: Open Heart Surgery;  Laterality: N/A;   TUBAL LIGATION     VAGINAL HYSTERECTOMY       Current Meds  Medication Sig   albuterol (PROVENTIL HFA;VENTOLIN  HFA) 108 (90 Base) MCG/ACT inhaler Inhale 2 puffs into the lungs every 6 (six) hours as needed for wheezing or shortness of breath.   amLODipine (NORVASC) 5 MG tablet TAKE ONE TABLET BY MOUTH EVERY MORNING   Ascorbic Acid (VITAMIN C) 1000 MG tablet Take 1,000 mg by mouth daily.   aspirin 81 MG tablet Take 81 mg by mouth daily.    Blood Glucose Monitoring Suppl (ONETOUCH VERIO IQ SYSTEM) w/Device KIT USE TO CHECK BLOOD SUGAR ONCE DAILY Dx code E11.9   Cholecalciferol (VITAMIN D) 2000 UNITS tablet Take 2,000 Units by mouth daily.   glimepiride (AMARYL) 1 MG tablet TAKE ONE TABLET BY MOUTH EVERYDAY AT BEDTIME   losartan (COZAAR) 50 MG tablet Take 50 mg by mouth daily.   metFORMIN (GLUCOPHAGE-XR) 500 MG 24 hr tablet TAKE TWO TABLETS BY MOUTH EVERY EVENING   ONETOUCH DELICA LANCETS 35T MISC USE TO CHECK BLOOD SUGAR ONCE DAILY Dx code E11.9   ONETOUCH VERIO test strip USE 1 STRIP TO CHECK GLUCOSE ONCE DAILY   pramipexole (MIRAPEX) 0.25 MG tablet Take 0.25 mg by mouth at bedtime.    rosuvastatin (CRESTOR) 20 MG tablet TAKE ONE TABLET BY MOUTH EVERYDAY AT BEDTIME   sertraline (ZOLOFT) 50 MG tablet Take 50 mg by mouth daily.   vitamin B-12 (CYANOCOBALAMIN) 1000 MCG tablet Take 1,000 mcg by mouth daily.     Allergies:   Actos [pioglitazone]   Social History   Tobacco Use   Smoking status: Never   Smokeless tobacco: Never  Vaping Use   Vaping Use: Never used  Substance Use Topics   Alcohol use: No    Alcohol/week: 0.0 standard drinks   Drug use: Never     Family Hx: The patient's family history includes Breast cancer (age of onset: 35) in her maternal grandmother; Cancer in her mother; Diabetes in her mother, paternal grandmother, and sister.  ROS:   Please see the history of present illness.    No Fever, chills  or productive cough All other systems reviewed and are negative.  Recent Labs: 02/16/2021: ALT 17 06/28/2021: BUN 26; Creatinine, Ser 1.22; Potassium 4.8; Sodium 140   Recent Lipid Panel Lab Results  Component Value Date/Time   CHOL 82 02/16/2021 08:54 AM   TRIG 85.0 02/16/2021 08:54 AM   HDL 33.60 (L) 02/16/2021 08:54 AM   CHOLHDL 2 02/16/2021 08:54 AM   LDLCALC 32 02/16/2021 08:54 AM    Wt Readings from Last 3 Encounters:  08/22/21 190 lb (86.2 kg)  06/29/21 191 lb 12.8 oz (87 kg)  02/21/21  187 lb (84.8 kg)     Objective:    Vital Signs:  BP 124/77   Pulse (!) 52   Ht 5' (1.524 m)   Wt 190 lb (86.2 kg)   BMI 37.11 kg/m    VITAL SIGNS:  reviewed NAD Answers questions appropriately Normal affect Remainder of physical examination not performed (telehealth visit; coronavirus pandemic)  ASSESSMENT & PLAN:    Previous TAVR-continue SBE prophylaxis.  She has some dyspnea on exertion.  We will repeat echocardiogram to reassess aortic valve. Hypertension-patient's blood pressure is elevated at home.  Increase losartan to 100 mg daily.  In 1 week check potassium and renal function.  Follow blood pressure and adjust regimen as needed. Hyperlipidemia-continue statin.  Check lipids and liver. Carotid artery disease-mild on most recent Dopplers. Morbid obesity-we again discussed weight loss.  COVID-19 Education: The importance of social distancing was discussed today.  Time:   Today, I  have spent 16 minutes with the patient with telehealth technology discussing the above problems.     Medication Adjustments/Labs and Tests Ordered: Current medicines are reviewed at length with the patient today.  Concerns regarding medicines are outlined above.   Tests Ordered: No orders of the defined types were placed in this encounter.   Medication Changes: No orders of the defined types were placed in this encounter.   Follow Up:  In Person in 1 year(s)  Signed, Kirk Ruths, MD  08/22/2021 8:15 AM    [facility]

## 2021-08-22 ENCOUNTER — Telehealth (INDEPENDENT_AMBULATORY_CARE_PROVIDER_SITE_OTHER): Payer: Medicare Other | Admitting: Cardiology

## 2021-08-22 ENCOUNTER — Encounter: Payer: Self-pay | Admitting: Cardiology

## 2021-08-22 VITALS — BP 124/77 | HR 52 | Ht 60.0 in | Wt 190.0 lb

## 2021-08-22 DIAGNOSIS — E7800 Pure hypercholesterolemia, unspecified: Secondary | ICD-10-CM

## 2021-08-22 DIAGNOSIS — I10 Essential (primary) hypertension: Secondary | ICD-10-CM | POA: Diagnosis not present

## 2021-08-22 DIAGNOSIS — Z952 Presence of prosthetic heart valve: Secondary | ICD-10-CM | POA: Diagnosis not present

## 2021-08-22 MED ORDER — LOSARTAN POTASSIUM 100 MG PO TABS: 100.0000 mg | ORAL_TABLET | Freq: Every day | ORAL | 3 refills | Status: DC

## 2021-08-22 NOTE — Patient Instructions (Signed)
Medication Instructions:   INCREASE LOSARTAN TO 100 MG ONCE DAILY= 2 OF THE 50 MG TABLETS ONCE DAILY  *If you need a refill on your cardiac medications before your next appointment, please call your pharmacy*   Lab Work:  Your physician recommends that you HAVE LAB WORK WITH YOUR MEDICAL DOCTOR=BMP/LIPID/LIVER  If you have labs (blood work) drawn today and your tests are completely normal, you will receive your results only by: MyChart Message (if you have MyChart) OR A paper copy in the mail If you have any lab test that is abnormal or we need to change your treatment, we will call you to review the results.   Testing/Procedures:  Your physician has requested that you have an echocardiogram. Echocardiography is a painless test that uses sound waves to create images of your heart. It provides your doctor with information about the size and shape of your heart and how well your heart's chambers and valves are working. This procedure takes approximately one hour. There are no restrictions for this procedure. [address]   Follow-Up: At [facility], you and your health needs are our priority.  As part of our continuing mission to provide you with exceptional heart care, we have created designated Provider Care Teams.  These Care Teams include your primary Cardiologist (physician) and Advanced Practice Providers (APPs -  Physician Assistants and Nurse Practitioners) who all work together to provide you with the care you need, when you need it.  We recommend signing up for the patient portal called "MyChart".  Sign up information is provided on this After Visit Summary.  MyChart is used to connect with patients for Virtual Visits (Telemedicine).  Patients are able to view lab/test results, encounter notes, upcoming appointments, etc.  Non-urgent messages can be sent to your provider as well.   To learn more about what you can do with MyChart, go to [email or web address].     Your next appointment:   12 month(s)  The format for your next appointment:   In Person  Provider:   Olga Millers MD

## 2021-09-04 DIAGNOSIS — I10 Essential (primary) hypertension: Secondary | ICD-10-CM | POA: Diagnosis not present

## 2021-09-06 DIAGNOSIS — I10 Essential (primary) hypertension: Secondary | ICD-10-CM | POA: Diagnosis not present

## 2021-09-15 ENCOUNTER — Ambulatory Visit (HOSPITAL_COMMUNITY): Payer: Medicare Other | Attending: Internal Medicine

## 2021-09-15 ENCOUNTER — Other Ambulatory Visit: Payer: Self-pay

## 2021-09-15 DIAGNOSIS — Z952 Presence of prosthetic heart valve: Secondary | ICD-10-CM

## 2021-09-15 LAB — ECHOCARDIOGRAM COMPLETE
AR max vel: 2.17 cm2
AV Area VTI: 2.25 cm2
AV Area mean vel: 2.22 cm2
AV Mean grad: 15.4 mmHg
AV Peak grad: 29.1 mmHg
Ao pk vel: 2.7 m/s
Area-P 1/2: 2.93 cm2
S' Lateral: 3.1 cm

## 2021-09-18 ENCOUNTER — Encounter: Payer: Self-pay | Admitting: *Deleted

## 2021-09-18 DIAGNOSIS — N183 Chronic kidney disease, stage 3 unspecified: Secondary | ICD-10-CM | POA: Diagnosis not present

## 2021-09-18 DIAGNOSIS — E7800 Pure hypercholesterolemia, unspecified: Secondary | ICD-10-CM | POA: Diagnosis not present

## 2021-09-18 DIAGNOSIS — I10 Essential (primary) hypertension: Secondary | ICD-10-CM | POA: Diagnosis not present

## 2021-09-18 DIAGNOSIS — E1165 Type 2 diabetes mellitus with hyperglycemia: Secondary | ICD-10-CM | POA: Diagnosis not present

## 2021-09-18 DIAGNOSIS — E1122 Type 2 diabetes mellitus with diabetic chronic kidney disease: Secondary | ICD-10-CM | POA: Diagnosis not present

## 2021-09-18 DIAGNOSIS — M8580 Other specified disorders of bone density and structure, unspecified site: Secondary | ICD-10-CM | POA: Diagnosis not present

## 2021-09-18 DIAGNOSIS — I272 Pulmonary hypertension, unspecified: Secondary | ICD-10-CM | POA: Diagnosis not present

## 2021-10-16 ENCOUNTER — Other Ambulatory Visit: Payer: Self-pay | Admitting: Endocrinology

## 2021-10-16 DIAGNOSIS — E1165 Type 2 diabetes mellitus with hyperglycemia: Secondary | ICD-10-CM

## 2021-10-30 ENCOUNTER — Other Ambulatory Visit (INDEPENDENT_AMBULATORY_CARE_PROVIDER_SITE_OTHER): Payer: Medicare Other

## 2021-10-30 ENCOUNTER — Other Ambulatory Visit: Payer: Self-pay

## 2021-10-30 ENCOUNTER — Other Ambulatory Visit: Payer: Self-pay | Admitting: Endocrinology

## 2021-10-30 DIAGNOSIS — E1165 Type 2 diabetes mellitus with hyperglycemia: Secondary | ICD-10-CM | POA: Diagnosis not present

## 2021-10-30 LAB — BASIC METABOLIC PANEL WITH GFR
BUN: 22 mg/dL (ref 6–23)
CO2: 30 meq/L (ref 19–32)
Calcium: 9.4 mg/dL (ref 8.4–10.5)
Chloride: 101 meq/L (ref 96–112)
Creatinine, Ser: 1.19 mg/dL (ref 0.40–1.20)
GFR: 44.76 mL/min — ABNORMAL LOW
Glucose, Bld: 133 mg/dL — ABNORMAL HIGH (ref 70–99)
Potassium: 5.1 meq/L (ref 3.5–5.1)
Sodium: 139 meq/L (ref 135–145)

## 2021-10-30 LAB — HEMOGLOBIN A1C: Hgb A1c MFr Bld: 7 % — ABNORMAL HIGH (ref 4.6–6.5)

## 2021-10-31 ENCOUNTER — Ambulatory Visit: Payer: Medicare Other | Admitting: Podiatry

## 2021-11-02 ENCOUNTER — Other Ambulatory Visit: Payer: Self-pay

## 2021-11-02 ENCOUNTER — Ambulatory Visit: Payer: Medicare Other | Admitting: Endocrinology

## 2021-11-02 ENCOUNTER — Encounter: Payer: Self-pay | Admitting: Endocrinology

## 2021-11-02 ENCOUNTER — Ambulatory Visit (INDEPENDENT_AMBULATORY_CARE_PROVIDER_SITE_OTHER): Payer: Medicare Other | Admitting: Endocrinology

## 2021-11-02 VITALS — BP 140/70 | HR 51 | Ht 60.0 in | Wt 193.0 lb

## 2021-11-02 DIAGNOSIS — E1129 Type 2 diabetes mellitus with other diabetic kidney complication: Secondary | ICD-10-CM | POA: Diagnosis not present

## 2021-11-02 DIAGNOSIS — E1165 Type 2 diabetes mellitus with hyperglycemia: Secondary | ICD-10-CM

## 2021-11-02 DIAGNOSIS — R809 Proteinuria, unspecified: Secondary | ICD-10-CM | POA: Diagnosis not present

## 2021-11-02 DIAGNOSIS — I10 Essential (primary) hypertension: Secondary | ICD-10-CM

## 2021-11-02 NOTE — Progress Notes (Signed)
Patient ID: Shannon Kidd, female   DOB: 26-Nov-1945, 76 y.o.   MRN: [id number]           Reason for Appointment: Followup    History of Present Illness:          Diagnosis: Type 2 diabetes mellitus, date of diagnosis: 2000        Past history: She used Metformin for her diabetes for several years until early 2015 and was taking 1g bid before it was stopped. She thinks her blood sugars are well controlled with this. Also at some point she was given Actos but this was stopped because of edema. Metformin was stopped in early 2015 on the suggestion of a nephrologist because of her renal dysfunction Since early 2015 she had been taking Januvia 50 mg daily When her blood sugars were not well controlled with A1c of 8% in 2015 she was started on low dose metformin to control fasting hyperglycemia  Recent history:   Oral hypoglycemic drugs the patient is taking are: Amaryl 1 mg at dinnertime, 1000 mg metformin ER at bedtime     Current management, blood sugar patterns and problems identified: Her A1c is 7 compared to 6.6 She has relatively higher readings compared to her last visit and not clear why She has taken her metformin and Amaryl as directed Does not appear to have any consistent pattern in her blood sugars, because of her using the meter sometimes to check a family member's blood sugar not clear if she has consistent readings Also not clear if her meter time is accurate She thinks she is trying to be very consistent with her diet except of occasional sweets or large portions No hypoglycemic symptoms.  Lowest blood sugar 77 without symptoms  She is only able to do some walking at the shopping areas but not not exercise otherwise   Side effects from medications have been: Diarrhea from high-dose or regular metformin  Glucose monitoring:  done <1 times a day         Glucometer: Verio      Blood Glucose readings by download:  Some of the readings on her meter are from a family  member Also unclear if the meter has the right time programmed  PRE-MEAL Fasting Lunch Dinner Bedtime Overall  Glucose range:     77-168  Mean/median: 128    133   POST-MEAL PC Breakfast PC Lunch PC Dinner  Glucose range:     Mean/median:   143   Previously:  PRE-MEAL Fasting Lunch Dinner Bedtime Overall  Glucose range:     75-156  Mean/median: 118    111   POST-MEAL PC Breakfast PC Lunch PC Dinner  Glucose range: 156    Mean/median:         Self-care:      Meals:  2-3 meals per day.  Breakfast is a egg/toast      Dietician visit: Most recent: 8/15.               Weight history: 195-245 previously  Wt Readings from Last 3 Encounters:  11/02/21 193 lb (87.5 kg)  08/22/21 190 lb (86.2 kg)  06/29/21 191 lb 12.8 oz (87 kg)    Lab Results  Component Value Date   HGBA1C 7.0 (H) 10/30/2021   HGBA1C 6.6 (H) 06/28/2021   HGBA1C 6.7 (H) 02/16/2021   Lab Results  Component Value Date   MICROALBUR 28.6 (H) 06/28/2021   LDLCALC 32 02/16/2021   CREATININE 1.19 10/30/2021  Allergies as of 11/02/2021       Reactions   Actos [pioglitazone] Swelling        Medication List        Accurate as of November 02, 2021 10:51 AM. If you have any questions, ask your nurse or doctor.          albuterol 108 (90 Base) MCG/ACT inhaler Commonly known as: VENTOLIN HFA Inhale 2 puffs into the lungs every 6 (six) hours as needed for wheezing or shortness of breath.   amLODipine 5 MG tablet Commonly known as: NORVASC TAKE ONE TABLET BY MOUTH EVERY MORNING   aspirin 81 MG tablet Take 81 mg by mouth daily.   glimepiride 1 MG tablet Commonly known as: AMARYL TAKE ONE TABLET BY MOUTH EVERYDAY AT BEDTIME   losartan 100 MG tablet Commonly known as: COZAAR Take 1 tablet (100 mg total) by mouth daily.   metFORMIN 500 MG 24 hr tablet Commonly known as: GLUCOPHAGE-XR TAKE TWO TABLETS BY MOUTH EVERY EVENING   OneTouch Delica Lancets 01U Misc USE TO CHECK BLOOD SUGAR ONCE  DAILY Dx code E11.9   OneTouch Verio IQ System w/Device Kit USE TO CHECK BLOOD SUGAR ONCE DAILY Dx code E11.9   OneTouch Verio test strip Generic drug: glucose blood USE 1 STRIP TO CHECK GLUCOSE ONCE DAILY   pramipexole 0.25 MG tablet Commonly known as: MIRAPEX Take 0.25 mg by mouth at bedtime.   rosuvastatin 20 MG tablet Commonly known as: CRESTOR TAKE ONE TABLET BY MOUTH EVERYDAY AT BEDTIME   sertraline 50 MG tablet Commonly known as: ZOLOFT Take 50 mg by mouth daily.   vitamin B-12 1000 MCG tablet Commonly known as: CYANOCOBALAMIN Take 1,000 mcg by mouth daily.   vitamin C 1000 MG tablet Take 1,000 mg by mouth daily.   Vitamin D 50 MCG (2000 UT) tablet Take 2,000 Units by mouth daily.        Allergies:  Allergies  Allergen Reactions   Actos [Pioglitazone] Swelling    Past Medical History:  Diagnosis Date   Anxiety    Cervical myelopathy (HCC)    s/p cervical fusion @ [facility]   Chronic diastolic congestive heart failure (HCC)    CKD (chronic kidney disease), stage III (HCC)    Diabetes mellitus ([place])    Glaucoma    HTN (hypertension)    Hyperlipemia    Morbid obesity (HCC)    Rheumatic fever    S/P TAVR (transcatheter aortic valve replacement) 07/29/2018   26 mm Edwards Sapien 3 transcatheter heart valve placed via percutaneous right transfemoral approach    Severe aortic stenosis     Past Surgical History:  Procedure Laterality Date   APPENDECTOMY     CARDIAC CATHETERIZATION     EYE SURGERY Bilateral    cataract extraction bilateral with lens   INTRAOPERATIVE TRANSTHORACIC ECHOCARDIOGRAM  07/29/2018   Procedure: INTRAOPERATIVE TRANSTHORACIC ECHOCARDIOGRAM;  Surgeon: Sherren Mocha, MD;  Location: [facility];  Service: Open Heart Surgery;;   NECK SURGERY     RIGHT/LEFT HEART CATH AND CORONARY ANGIOGRAPHY N/A 06/04/2018   Procedure: RIGHT/LEFT HEART CATH AND CORONARY ANGIOGRAPHY;  Surgeon: Sherren Mocha, MD;  Location: [facility];   Service: Cardiovascular;  Laterality: N/A;   TONSILLECTOMY     TRANSCATHETER AORTIC VALVE REPLACEMENT, TRANSFEMORAL N/A 07/29/2018   Procedure: TRANSCATHETER AORTIC VALVE REPLACEMENT, TRANSFEMORAL;  Surgeon: Sherren Mocha, MD;  Location: [facility];  Service: Open Heart Surgery;  Laterality: N/A;   TUBAL LIGATION  VAGINAL HYSTERECTOMY      Family History  Problem Relation Age of Onset   Diabetes Mother    Cancer Mother        Colon   Diabetes Sister    Diabetes Paternal Grandmother    Breast cancer Maternal Grandmother 72    Social History:  reports that she has never smoked. She has never used smokeless tobacco. She reports that she does not drink alcohol and does not use drugs.    Review of Systems       Lipids: She has been treated with Crestor 20 mg by cardiologist HDL tends to be low Last LDL done by PCP was 23 done on 07/09/2019       Lab Results  Component Value Date   CHOL 82 02/16/2021   HDL 33.60 (L) 02/16/2021   LDLCALC 32 02/16/2021   TRIG 85.0 02/16/2021   CHOLHDL 2 02/16/2021                  The blood pressure has been treated with Norvasc 5 mg daily prescribed by her cardiologist Now taking 50 mg of losartan for blood pressure control  She is also followed by her PCP  Blood pressure readings at home appear to fluctuate   Recent range 128--150/70-73   BP Readings from Last 3 Encounters:  11/02/21 140/70  08/22/21 124/77  06/29/21 140/66        Diabetic foot exam done in 5/22 with normal findings   RENAL dysfunction: Although her creatinine had gone up to 1.53 in the past it has been consistently upper normal  Also has had mild microalbuminuria, last microalbumin ratio 45  Potassium is upper normal, she is not restricting foods like oranges and bananas   Lab Results  Component Value Date   CREATININE 1.19 10/30/2021   CREATININE 1.22 (H) 06/28/2021   CREATININE 1.22 (H) 02/16/2021    She is on vitamin D supplements which is followed by  PCP   LABS:  Lab on 10/30/2021  Component Date Value Ref Range Status   Sodium 10/30/2021 139  135 - 145 mEq/L Final   Potassium 10/30/2021 5.1  3.5 - 5.1 mEq/L Final   Chloride 10/30/2021 101  96 - 112 mEq/L Final   CO2 10/30/2021 30  19 - 32 mEq/L Final   Glucose, Bld 10/30/2021 133 (H)  70 - 99 mg/dL Final   BUN 10/30/2021 22  6 - 23 mg/dL Final   Creatinine, Ser 10/30/2021 1.19  0.40 - 1.20 mg/dL Final   GFR 10/30/2021 44.76 (L)  >60.00 mL/min Final   Calculated using the CKD-EPI Creatinine Equation (2021)   Calcium 10/30/2021 9.4  8.4 - 10.5 mg/dL Final   Hgb A1c MFr Bld 10/30/2021 7.0 (H)  4.6 - 6.5 % Final   Glycemic Control Guidelines for People with Diabetes:Non Diabetic:  <6%Goal of Therapy: <7%Additional Action Suggested:  >8%     Physical Examination:  BP 140/70    Pulse (!) 51    Ht 5' (1.524 m)    Wt 193 lb (87.5 kg)    SpO2 99%    BMI 37.69 kg/m    ASSESSMENT:  Diabetes type 2 with obesity  See history of present illness for discussion of current diabetes management, blood sugar patterns and problems identified  Her A1c is 7  She has somewhat higher blood sugars still not over 170 at any given time Fasting readings may be relatively higher than before  Has not lost any weight  However considering her age and comorbid conditions her level of control is still adequate  RENAL dysfunction: Has upper normal creatinine and stable  High normal potassium present  HYPERTENSION: Fair control and blood pressure usually near target numbers   PLAN:  She will not change her medication regimen She will program the proper time on her meter Encourage her to start some upper body exercises  Cut back on high potassium foods like bananas, oranges and cantaloupes  Follow-up in 4 months   There are no Patient Instructions on file for this visit.   Elayne Snare 11/02/2021, 10:51 AM   Note: This office note was prepared with Dragon voice recognition system  technology. Any transcriptional errors that result from this process are unintentional.

## 2021-11-10 ENCOUNTER — Other Ambulatory Visit: Payer: Self-pay

## 2021-11-10 ENCOUNTER — Ambulatory Visit: Payer: Medicare Other | Admitting: Podiatry

## 2021-11-10 ENCOUNTER — Encounter: Payer: Self-pay | Admitting: Podiatry

## 2021-11-10 DIAGNOSIS — B351 Tinea unguium: Secondary | ICD-10-CM | POA: Diagnosis not present

## 2021-11-10 DIAGNOSIS — M79609 Pain in unspecified limb: Secondary | ICD-10-CM | POA: Diagnosis not present

## 2021-11-18 NOTE — Progress Notes (Signed)
°  Subjective:  Patient ID: Shannon Kidd, female    DOB: 1946-01-16,  MRN: [id number]  Shannon Kidd presents to clinic today for painful elongated mycotic toenails 1-5 bilaterally which are tender when wearing enclosed shoe gear. Pain is relieved with periodic professional debridement.  Patient states blood glucose was 133 mg/dl today.  New problem(s): None.   PCP is Dennie Maizes, NP , and last visit was 09/06/2021. She is also followed by Dr. Reather Littler for her diabetes and last visit was 11/02/2021.  Allergies  Allergen Reactions   Actos [Pioglitazone] Swelling    Review of Systems: Negative except as noted in the HPI. Objective:   Constitutional Shannon Kidd is a pleasant 76 y.o. Caucasian female, WD, WN in NAD. AAO x 3.   Vascular CFT immediate b/l LE. Palpable DP/PT pulses b/l LE. Digital hair present b/l. Skin temperature gradient WNL b/l. No pain with calf compression b/l. No edema noted b/l. No cyanosis or clubbing noted b/l LE.  Neurologic Normal speech. Oriented to person, place, and time. Protective sensation intact 5/5 intact bilaterally with 10g monofilament b/l. Vibratory sensation intact b/l. Proprioception intact bilaterally.  Dermatologic Toenails 1-5 b/l elongated, discolored, dystrophic, thickened, crumbly with subungual debris and tenderness to dorsal palpation.  Orthopedic: Muscle strength 5/5 to all lower extremity muscle groups bilaterally. No pain, crepitus or joint limitation noted with ROM bilateral LE. No gross bony deformities bilaterally.   Radiographs: None  Last A1c:  Hemoglobin A1C Latest Ref Rng & Units 10/30/2021 06/28/2021 02/16/2021  HGBA1C 4.6 - 6.5 % 7.0(H) 6.6(H) 6.7(H)  Some recent data might be hidden    Assessment:   1. Pain due to onychomycosis of nail    Plan:  Patient was evaluated and treated and all questions answered. Consent given for treatment as described below: -Patient to continue soft, supportive shoe gear daily. -Mycotic  toenails 1-5 bilaterally were debrided in length and girth with sterile nail nippers and dremel without incident. -Patient/POA to call should there be question/concern in the interim.  Return in about 3 months (around 02/07/2022).  Freddie Breech, DPM

## 2021-11-20 ENCOUNTER — Other Ambulatory Visit: Payer: Self-pay | Admitting: Endocrinology

## 2021-11-20 DIAGNOSIS — E1165 Type 2 diabetes mellitus with hyperglycemia: Secondary | ICD-10-CM

## 2021-11-23 ENCOUNTER — Other Ambulatory Visit: Payer: Self-pay | Admitting: Endocrinology

## 2021-11-23 DIAGNOSIS — E1165 Type 2 diabetes mellitus with hyperglycemia: Secondary | ICD-10-CM

## 2021-12-25 ENCOUNTER — Ambulatory Visit: Payer: Medicare Other | Admitting: Podiatry

## 2022-01-30 ENCOUNTER — Other Ambulatory Visit: Payer: Self-pay | Admitting: Endocrinology

## 2022-01-30 DIAGNOSIS — E1165 Type 2 diabetes mellitus with hyperglycemia: Secondary | ICD-10-CM

## 2022-02-04 DIAGNOSIS — A419 Sepsis, unspecified organism: Secondary | ICD-10-CM | POA: Diagnosis not present

## 2022-02-04 DIAGNOSIS — E785 Hyperlipidemia, unspecified: Secondary | ICD-10-CM | POA: Diagnosis not present

## 2022-02-04 DIAGNOSIS — N132 Hydronephrosis with renal and ureteral calculous obstruction: Secondary | ICD-10-CM | POA: Diagnosis not present

## 2022-02-04 DIAGNOSIS — R110 Nausea: Secondary | ICD-10-CM | POA: Diagnosis not present

## 2022-02-04 DIAGNOSIS — N200 Calculus of kidney: Secondary | ICD-10-CM | POA: Diagnosis not present

## 2022-02-04 DIAGNOSIS — Z8673 Personal history of transient ischemic attack (TIA), and cerebral infarction without residual deficits: Secondary | ICD-10-CM | POA: Diagnosis not present

## 2022-02-04 DIAGNOSIS — I350 Nonrheumatic aortic (valve) stenosis: Secondary | ICD-10-CM | POA: Diagnosis not present

## 2022-02-04 DIAGNOSIS — E1122 Type 2 diabetes mellitus with diabetic chronic kidney disease: Secondary | ICD-10-CM | POA: Diagnosis not present

## 2022-02-04 DIAGNOSIS — R6510 Systemic inflammatory response syndrome (SIRS) of non-infectious origin without acute organ dysfunction: Secondary | ICD-10-CM | POA: Diagnosis not present

## 2022-02-04 DIAGNOSIS — N281 Cyst of kidney, acquired: Secondary | ICD-10-CM | POA: Diagnosis not present

## 2022-02-04 DIAGNOSIS — I517 Cardiomegaly: Secondary | ICD-10-CM | POA: Diagnosis not present

## 2022-02-04 DIAGNOSIS — N183 Chronic kidney disease, stage 3 unspecified: Secondary | ICD-10-CM | POA: Diagnosis not present

## 2022-02-04 DIAGNOSIS — F32A Depression, unspecified: Secondary | ICD-10-CM | POA: Diagnosis not present

## 2022-02-04 DIAGNOSIS — N209 Urinary calculus, unspecified: Secondary | ICD-10-CM | POA: Insufficient documentation

## 2022-02-04 DIAGNOSIS — N201 Calculus of ureter: Secondary | ICD-10-CM | POA: Insufficient documentation

## 2022-02-04 DIAGNOSIS — R9431 Abnormal electrocardiogram [ECG] [EKG]: Secondary | ICD-10-CM | POA: Diagnosis not present

## 2022-02-04 DIAGNOSIS — Z79899 Other long term (current) drug therapy: Secondary | ICD-10-CM | POA: Diagnosis not present

## 2022-02-04 DIAGNOSIS — R918 Other nonspecific abnormal finding of lung field: Secondary | ICD-10-CM | POA: Diagnosis not present

## 2022-02-04 DIAGNOSIS — R509 Fever, unspecified: Secondary | ICD-10-CM | POA: Diagnosis not present

## 2022-02-04 DIAGNOSIS — R0902 Hypoxemia: Secondary | ICD-10-CM | POA: Diagnosis not present

## 2022-02-04 DIAGNOSIS — D72829 Elevated white blood cell count, unspecified: Secondary | ICD-10-CM | POA: Diagnosis not present

## 2022-02-04 DIAGNOSIS — K8020 Calculus of gallbladder without cholecystitis without obstruction: Secondary | ICD-10-CM | POA: Diagnosis not present

## 2022-02-04 DIAGNOSIS — N179 Acute kidney failure, unspecified: Secondary | ICD-10-CM | POA: Diagnosis not present

## 2022-02-04 DIAGNOSIS — Z888 Allergy status to other drugs, medicaments and biological substances status: Secondary | ICD-10-CM | POA: Diagnosis not present

## 2022-02-04 DIAGNOSIS — Z7982 Long term (current) use of aspirin: Secondary | ICD-10-CM | POA: Diagnosis not present

## 2022-02-04 DIAGNOSIS — I129 Hypertensive chronic kidney disease with stage 1 through stage 4 chronic kidney disease, or unspecified chronic kidney disease: Secondary | ICD-10-CM | POA: Diagnosis not present

## 2022-02-04 DIAGNOSIS — I5032 Chronic diastolic (congestive) heart failure: Secondary | ICD-10-CM | POA: Diagnosis not present

## 2022-02-04 DIAGNOSIS — N1330 Unspecified hydronephrosis: Secondary | ICD-10-CM | POA: Diagnosis not present

## 2022-02-04 DIAGNOSIS — Z952 Presence of prosthetic heart valve: Secondary | ICD-10-CM | POA: Diagnosis not present

## 2022-02-04 DIAGNOSIS — Z7984 Long term (current) use of oral hypoglycemic drugs: Secondary | ICD-10-CM | POA: Diagnosis not present

## 2022-02-04 DIAGNOSIS — N390 Urinary tract infection, site not specified: Secondary | ICD-10-CM | POA: Diagnosis not present

## 2022-02-04 DIAGNOSIS — N10 Acute tubulo-interstitial nephritis: Secondary | ICD-10-CM | POA: Diagnosis not present

## 2022-02-08 ENCOUNTER — Other Ambulatory Visit: Payer: Self-pay | Admitting: Cardiology

## 2022-02-08 DIAGNOSIS — I350 Nonrheumatic aortic (valve) stenosis: Secondary | ICD-10-CM

## 2022-02-09 ENCOUNTER — Ambulatory Visit: Payer: Medicare Other | Admitting: Podiatry

## 2022-02-19 ENCOUNTER — Other Ambulatory Visit (INDEPENDENT_AMBULATORY_CARE_PROVIDER_SITE_OTHER): Payer: Medicare Other

## 2022-02-19 DIAGNOSIS — E1165 Type 2 diabetes mellitus with hyperglycemia: Secondary | ICD-10-CM | POA: Diagnosis not present

## 2022-02-19 LAB — HEMOGLOBIN A1C: Hgb A1c MFr Bld: 7.2 % — ABNORMAL HIGH (ref 4.6–6.5)

## 2022-02-20 DIAGNOSIS — N201 Calculus of ureter: Secondary | ICD-10-CM | POA: Diagnosis not present

## 2022-02-20 DIAGNOSIS — R8290 Unspecified abnormal findings in urine: Secondary | ICD-10-CM | POA: Diagnosis not present

## 2022-02-20 DIAGNOSIS — N200 Calculus of kidney: Secondary | ICD-10-CM | POA: Diagnosis not present

## 2022-02-20 LAB — BASIC METABOLIC PANEL WITH GFR
BUN: 27 mg/dL — ABNORMAL HIGH (ref 6–23)
CO2: 21 meq/L (ref 19–32)
Calcium: 9.2 mg/dL (ref 8.4–10.5)
Chloride: 100 meq/L (ref 96–112)
Creatinine, Ser: 1.53 mg/dL — ABNORMAL HIGH (ref 0.40–1.20)
GFR: 33.03 mL/min — ABNORMAL LOW
Glucose, Bld: 79 mg/dL (ref 70–99)
Potassium: 5.5 meq/L — ABNORMAL HIGH (ref 3.5–5.1)
Sodium: 130 meq/L — ABNORMAL LOW (ref 135–145)

## 2022-02-26 ENCOUNTER — Ambulatory Visit (INDEPENDENT_AMBULATORY_CARE_PROVIDER_SITE_OTHER): Payer: Medicare Other | Admitting: Endocrinology

## 2022-02-26 ENCOUNTER — Encounter: Payer: Self-pay | Admitting: Endocrinology

## 2022-02-26 VITALS — BP 130/70 | HR 56 | Ht 60.0 in | Wt 194.0 lb

## 2022-02-26 DIAGNOSIS — E875 Hyperkalemia: Secondary | ICD-10-CM | POA: Diagnosis not present

## 2022-02-26 DIAGNOSIS — E1165 Type 2 diabetes mellitus with hyperglycemia: Secondary | ICD-10-CM

## 2022-02-26 DIAGNOSIS — I10 Essential (primary) hypertension: Secondary | ICD-10-CM | POA: Diagnosis not present

## 2022-02-26 NOTE — Progress Notes (Signed)
Patient ID: Shannon Kidd, female   DOB: 1946/05/30, 76 y.o.   MRN: [id number]           Reason for Appointment: Followup    History of Present Illness:          Diagnosis: Type 2 diabetes mellitus, date of diagnosis: 2000        Past history: She used Metformin for her diabetes for several years until early 2015 and was taking 1g bid before it was stopped. She thinks her blood sugars are well controlled with this. Also at some point she was given Actos but this was stopped because of edema. Metformin was stopped in early 2015 on the suggestion of a nephrologist because of her renal dysfunction Since early 2015 she had been taking Januvia 50 mg daily When her blood sugars were not well controlled with A1c of 8% in 2015 she was started on low dose metformin to control fasting hyperglycemia  Recent history:   Oral hypoglycemic drugs the patient is taking are: Amaryl 1 mg at dinnertime, 1000 mg metformin ER at bedtime     Current management, blood sugar patterns and problems identified: Her A1c is 7.2 and slightly higher Her blood sugars recently however look fairly close to normal fasting She thinks that previously her blood sugars have been higher from going off her diet  However has not gained any weight from this  She is not using a One Touch monitor which could not be downloaded No hypoglycemic symptoms even with her blood sugar being 65 this morning.  Not able to do much exercise as before   Side effects from medications have been: Diarrhea from high-dose or regular metformin  Glucose monitoring:  done <1 times a day         Glucometer: Verio      Blood Glucose readings by download:   PRE-MEAL Fasting Lunch Dinner Bedtime Overall  Glucose range: 65-112  82    Mean/median:        POST-MEAL PC Breakfast PC Lunch PC Dinner  Glucose range:   171  Mean/median:      Previously  PRE-MEAL Fasting Lunch Dinner Bedtime Overall  Glucose range:     77-168  Mean/median: 128     133   POST-MEAL PC Breakfast PC Lunch PC Dinner  Glucose range:     Mean/median:   143     Self-care:      Meals:  2-3 meals per day.  Breakfast is a egg/toast      Dietician visit: Most recent: 8/15.               Weight history: 195-245 previously  Wt Readings from Last 3 Encounters:  02/26/22 194 lb (88 kg)  11/02/21 193 lb (87.5 kg)  08/22/21 190 lb (86.2 kg)    Lab Results  Component Value Date   HGBA1C 7.2 (H) 02/19/2022   HGBA1C 7.0 (H) 10/30/2021   HGBA1C 6.6 (H) 06/28/2021   Lab Results  Component Value Date   MICROALBUR 28.6 (H) 06/28/2021   LDLCALC 32 02/16/2021   CREATININE 1.53 (H) 02/19/2022    Allergies as of 02/26/2022       Reactions   Actos [pioglitazone] Swelling        Medication List        Accurate as of Feb 26, 2022 11:59 PM. If you have any questions, ask your nurse or doctor.          albuterol 108 (90 Base)  MCG/ACT inhaler Commonly known as: VENTOLIN HFA Inhale 2 puffs into the lungs every 6 (six) hours as needed for wheezing or shortness of breath.   amLODipine 5 MG tablet Commonly known as: NORVASC TAKE ONE TABLET BY MOUTH EVERY MORNING   aspirin 81 MG tablet Take 81 mg by mouth daily.   glimepiride 1 MG tablet Commonly known as: AMARYL TAKE ONE TABLET BY MOUTH EVERYDAY AT BEDTIME   losartan 100 MG tablet Commonly known as: COZAAR Take 1 tablet (100 mg total) by mouth daily.   metFORMIN 500 MG 24 hr tablet Commonly known as: GLUCOPHAGE-XR TAKE TWO TABLETS BY MOUTH every evening   OneTouch Delica Lancets 58P Misc USE TO CHECK BLOOD SUGAR ONCE DAILY Dx code E11.9   OneTouch Verio IQ System w/Device Kit USE TO CHECK BLOOD SUGAR ONCE DAILY Dx code E11.9   OneTouch Verio test strip Generic drug: glucose blood USE 1 STRIP TO CHECK GLUCOSE ONCE DAILY   pramipexole 0.25 MG tablet Commonly known as: MIRAPEX Take 0.25 mg by mouth at bedtime.   rosuvastatin 20 MG tablet Commonly known as: CRESTOR TAKE ONE  TABLET BY MOUTH EVERYDAY AT BEDTIME   sertraline 50 MG tablet Commonly known as: ZOLOFT Take 50 mg by mouth daily.   sodium bicarbonate 650 MG tablet Take 650 mg by mouth 3 (three) times daily.   vitamin B-12 1000 MCG tablet Commonly known as: CYANOCOBALAMIN Take 1,000 mcg by mouth daily.   vitamin C 1000 MG tablet Take 1,000 mg by mouth daily.   Vitamin D 50 MCG (2000 UT) tablet Take 2,000 Units by mouth daily.        Allergies:  Allergies  Allergen Reactions   Actos [Pioglitazone] Swelling    Past Medical History:  Diagnosis Date   Anxiety    Cervical myelopathy (HCC)    s/p cervical fusion @ [facility]   Chronic diastolic congestive heart failure (HCC)    CKD (chronic kidney disease), stage III (HCC)    Diabetes mellitus ([place])    Glaucoma    HTN (hypertension)    Hyperlipemia    Morbid obesity (HCC)    Rheumatic fever    S/P TAVR (transcatheter aortic valve replacement) 07/29/2018   26 mm Edwards Sapien 3 transcatheter heart valve placed via percutaneous right transfemoral approach    Severe aortic stenosis     Past Surgical History:  Procedure Laterality Date   APPENDECTOMY     CARDIAC CATHETERIZATION     EYE SURGERY Bilateral    cataract extraction bilateral with lens   INTRAOPERATIVE TRANSTHORACIC ECHOCARDIOGRAM  07/29/2018   Procedure: INTRAOPERATIVE TRANSTHORACIC ECHOCARDIOGRAM;  Surgeon: Sherren Mocha, MD;  Location: [facility];  Service: Open Heart Surgery;;   NECK SURGERY     RIGHT/LEFT HEART CATH AND CORONARY ANGIOGRAPHY N/A 06/04/2018   Procedure: RIGHT/LEFT HEART CATH AND CORONARY ANGIOGRAPHY;  Surgeon: Sherren Mocha, MD;  Location: [facility];  Service: Cardiovascular;  Laterality: N/A;   TONSILLECTOMY     TRANSCATHETER AORTIC VALVE REPLACEMENT, TRANSFEMORAL N/A 07/29/2018   Procedure: TRANSCATHETER AORTIC VALVE REPLACEMENT, TRANSFEMORAL;  Surgeon: Sherren Mocha, MD;  Location: [facility];  Service: Open Heart Surgery;  Laterality: N/A;    TUBAL LIGATION     VAGINAL HYSTERECTOMY      Family History  Problem Relation Age of Onset   Diabetes Mother    Cancer Mother        Colon   Diabetes Sister    Diabetes Paternal Grandmother    Breast cancer  Maternal Grandmother 75    Social History:  reports that she has never smoked. She has never used smokeless tobacco. She reports that she does not drink alcohol and does not use drugs.    Review of Systems       Lipids: She has been treated with Crestor 20 mg by cardiologist HDL tends to be low No recent labs available       Lab Results  Component Value Date   CHOL 82 02/16/2021   HDL 33.60 (L) 02/16/2021   LDLCALC 32 02/16/2021   TRIG 85.0 02/16/2021   CHOLHDL 2 02/16/2021                  The blood pressure has been treated with Norvasc 5 mg daily prescribed by her cardiologist Also taking 100 mg of losartan for blood pressure control  Blood pressure readings not unusually high or low  Recent range 130s   BP Readings from Last 3 Encounters:  02/26/22 130/70  11/02/21 140/70  08/22/21 124/77        Diabetic foot exam done in 5/22 with normal findings   RENAL dysfunction: Although her creatinine had gone up to 1.53 again Blood pressure has not been low She also has had issues with hyperkalemia preoperatively and now potassium is high again Also has had mild microalbuminuria, last microalbumin ratio 45  Lab Results  Component Value Date   K 5.5 (H) 02/19/2022     Lab Results  Component Value Date   CREATININE 1.53 (H) 02/19/2022   CREATININE 1.19 10/30/2021   CREATININE 1.22 (H) 06/28/2021    She is on vitamin D supplements which is followed by PCP   LABS:  No visits with results within 1 Week(s) from this visit.  Latest known visit with results is:  Lab on 02/19/2022  Component Date Value Ref Range Status   Sodium 02/19/2022 130 (L)  135 - 145 mEq/L Final   Potassium 02/19/2022 5.5 (H)  3.5 - 5.1 mEq/L Final   Chloride 02/19/2022 100   96 - 112 mEq/L Final   CO2 02/19/2022 21  19 - 32 mEq/L Final   Glucose, Bld 02/19/2022 79  70 - 99 mg/dL Final   BUN 02/19/2022 27 (H)  6 - 23 mg/dL Final   Creatinine, Ser 02/19/2022 1.53 (H)  0.40 - 1.20 mg/dL Final   GFR 02/19/2022 33.03 (L)  >60.00 mL/min Final   Calculated using the CKD-EPI Creatinine Equation (2021)   Calcium 02/19/2022 9.2  8.4 - 10.5 mg/dL Final   Hgb A1c MFr Bld 02/19/2022 7.2 (H)  4.6 - 6.5 % Final   Glycemic Control Guidelines for People with Diabetes:Non Diabetic:  <6%Goal of Therapy: <7%Additional Action Suggested:  >8%     Physical Examination:  BP 130/70   Pulse (!) 56   Ht 5' (1.524 m)   Wt 194 lb (88 kg)   SpO2 96%   BMI 37.89 kg/m    ASSESSMENT:  Diabetes type 2 with obesity  See history of present illness for discussion of current diabetes management, blood sugar patterns and problems identified  Her A1c is 7.2 She is on Amaryl and metformin 1000 mg a day  Fasting readings may be relatively lower especially recently and this may be from increased effect of metformin with renal dysfunction Not checking readings after meals except once Again has not lost any weight However considering her age and comorbid conditions her level of control is still adequate  RENAL dysfunction: Has higher  creatinine and potassium and likely has hyporeninemic hypoaldosteronism We will also need follow-up microalbumin on the next visit  HYPERTENSION: Blood pressure is controlled   PLAN:  Reduce metformin to 1 tablet daily, may increase again when renal function is restored Will forward her labs to her cardiologist for follow-up, meanwhile can reduce losartan to half tablet   Patient Instructions  Take 1 metformin onl9    Losartan 1/2 daily  Call Dr Stanford Breed re: Losartan  Check blood sugars on waking up 3 days a week  Also check blood sugars about 2 hours after meals and do this after different meals by rotation  Recommended blood sugar levels  on waking up are 90-130 and about 2 hours after meal is 130-160  Please bring your blood sugar monitor to each visit, thank you    Elayne Snare 02/27/2022, 3:41 PM   Note: This office note was prepared with Dragon voice recognition system technology. Any transcriptional errors that result from this process are unintentional.

## 2022-02-26 NOTE — Patient Instructions (Addendum)
Take 1 metformin only    Losartan 1/2 daily  Call Dr Stanford Breed re: Losartan  Check blood sugars on waking up 3 days a week  Also check blood sugars about 2 hours after meals and do this after different meals by rotation  Recommended blood sugar levels on waking up are 90-130 and about 2 hours after meal is 130-160  Please bring your blood sugar monitor to each visit, thank you

## 2022-03-01 DIAGNOSIS — N201 Calculus of ureter: Secondary | ICD-10-CM | POA: Diagnosis not present

## 2022-03-01 DIAGNOSIS — N200 Calculus of kidney: Secondary | ICD-10-CM | POA: Diagnosis not present

## 2022-03-01 DIAGNOSIS — R8290 Unspecified abnormal findings in urine: Secondary | ICD-10-CM | POA: Diagnosis not present

## 2022-03-16 DIAGNOSIS — I878 Other specified disorders of veins: Secondary | ICD-10-CM | POA: Diagnosis not present

## 2022-03-16 DIAGNOSIS — E1165 Type 2 diabetes mellitus with hyperglycemia: Secondary | ICD-10-CM | POA: Diagnosis not present

## 2022-03-26 ENCOUNTER — Telehealth: Payer: Self-pay

## 2022-03-26 DIAGNOSIS — I272 Pulmonary hypertension, unspecified: Secondary | ICD-10-CM | POA: Diagnosis not present

## 2022-03-26 DIAGNOSIS — E7800 Pure hypercholesterolemia, unspecified: Secondary | ICD-10-CM | POA: Diagnosis not present

## 2022-03-26 DIAGNOSIS — E1165 Type 2 diabetes mellitus with hyperglycemia: Secondary | ICD-10-CM | POA: Diagnosis not present

## 2022-03-26 DIAGNOSIS — N183 Chronic kidney disease, stage 3 unspecified: Secondary | ICD-10-CM | POA: Diagnosis not present

## 2022-03-26 DIAGNOSIS — I10 Essential (primary) hypertension: Secondary | ICD-10-CM | POA: Diagnosis not present

## 2022-03-26 NOTE — Telephone Encounter (Signed)
Dorinda Hill PharmD from [facility] called wants to know if patient can stop glimepiride ER and go to glipizide XL 2.5mg  daily to stop hypoglycemia. States there are other options but because of cost and insurance feels this will work better for patient.

## 2022-04-02 DIAGNOSIS — N201 Calculus of ureter: Secondary | ICD-10-CM | POA: Diagnosis not present

## 2022-04-02 DIAGNOSIS — N200 Calculus of kidney: Secondary | ICD-10-CM | POA: Diagnosis not present

## 2022-04-02 DIAGNOSIS — N281 Cyst of kidney, acquired: Secondary | ICD-10-CM | POA: Diagnosis not present

## 2022-04-03 DIAGNOSIS — I890 Lymphedema, not elsewhere classified: Secondary | ICD-10-CM | POA: Diagnosis not present

## 2022-04-03 DIAGNOSIS — N183 Chronic kidney disease, stage 3 unspecified: Secondary | ICD-10-CM | POA: Diagnosis not present

## 2022-04-03 DIAGNOSIS — E119 Type 2 diabetes mellitus without complications: Secondary | ICD-10-CM | POA: Diagnosis not present

## 2022-04-03 DIAGNOSIS — I5032 Chronic diastolic (congestive) heart failure: Secondary | ICD-10-CM | POA: Diagnosis not present

## 2022-05-07 DIAGNOSIS — E1165 Type 2 diabetes mellitus with hyperglycemia: Secondary | ICD-10-CM | POA: Diagnosis not present

## 2022-05-07 DIAGNOSIS — I10 Essential (primary) hypertension: Secondary | ICD-10-CM | POA: Diagnosis not present

## 2022-05-07 DIAGNOSIS — N183 Chronic kidney disease, stage 3 unspecified: Secondary | ICD-10-CM | POA: Diagnosis not present

## 2022-05-07 DIAGNOSIS — E7800 Pure hypercholesterolemia, unspecified: Secondary | ICD-10-CM | POA: Diagnosis not present

## 2022-05-10 DIAGNOSIS — H4923 Sixth [abducent] nerve palsy, bilateral: Secondary | ICD-10-CM | POA: Diagnosis not present

## 2022-05-10 DIAGNOSIS — Z961 Presence of intraocular lens: Secondary | ICD-10-CM | POA: Diagnosis not present

## 2022-05-10 DIAGNOSIS — E119 Type 2 diabetes mellitus without complications: Secondary | ICD-10-CM | POA: Diagnosis not present

## 2022-05-10 DIAGNOSIS — H532 Diplopia: Secondary | ICD-10-CM | POA: Diagnosis not present

## 2022-05-10 DIAGNOSIS — H5051 Esophoria: Secondary | ICD-10-CM | POA: Diagnosis not present

## 2022-05-13 ENCOUNTER — Other Ambulatory Visit: Payer: Self-pay | Admitting: Endocrinology

## 2022-05-13 DIAGNOSIS — E1165 Type 2 diabetes mellitus with hyperglycemia: Secondary | ICD-10-CM

## 2022-05-17 DIAGNOSIS — I10 Essential (primary) hypertension: Secondary | ICD-10-CM | POA: Diagnosis not present

## 2022-05-17 DIAGNOSIS — E7800 Pure hypercholesterolemia, unspecified: Secondary | ICD-10-CM | POA: Diagnosis not present

## 2022-05-17 DIAGNOSIS — N183 Chronic kidney disease, stage 3 unspecified: Secondary | ICD-10-CM | POA: Diagnosis not present

## 2022-05-17 DIAGNOSIS — E1122 Type 2 diabetes mellitus with diabetic chronic kidney disease: Secondary | ICD-10-CM | POA: Diagnosis not present

## 2022-05-17 DIAGNOSIS — E1165 Type 2 diabetes mellitus with hyperglycemia: Secondary | ICD-10-CM | POA: Diagnosis not present

## 2022-05-28 ENCOUNTER — Other Ambulatory Visit: Payer: Self-pay | Admitting: Cardiology

## 2022-05-29 ENCOUNTER — Other Ambulatory Visit: Payer: Medicare Other

## 2022-05-30 ENCOUNTER — Other Ambulatory Visit: Payer: Self-pay

## 2022-05-30 DIAGNOSIS — E1165 Type 2 diabetes mellitus with hyperglycemia: Secondary | ICD-10-CM

## 2022-05-30 DIAGNOSIS — I5032 Chronic diastolic (congestive) heart failure: Secondary | ICD-10-CM | POA: Diagnosis not present

## 2022-05-30 DIAGNOSIS — I890 Lymphedema, not elsewhere classified: Secondary | ICD-10-CM | POA: Diagnosis not present

## 2022-05-30 DIAGNOSIS — N183 Chronic kidney disease, stage 3 unspecified: Secondary | ICD-10-CM | POA: Diagnosis not present

## 2022-05-30 MED ORDER — METFORMIN HCL ER 500 MG PO TB24: ORAL_TABLET | ORAL | 3 refills | Status: DC

## 2022-06-01 ENCOUNTER — Ambulatory Visit: Payer: Medicare Other | Admitting: Endocrinology

## 2022-06-04 ENCOUNTER — Other Ambulatory Visit: Payer: Self-pay | Admitting: Endocrinology

## 2022-06-13 DIAGNOSIS — M542 Cervicalgia: Secondary | ICD-10-CM | POA: Diagnosis not present

## 2022-06-13 DIAGNOSIS — M9902 Segmental and somatic dysfunction of thoracic region: Secondary | ICD-10-CM | POA: Diagnosis not present

## 2022-06-13 DIAGNOSIS — M546 Pain in thoracic spine: Secondary | ICD-10-CM | POA: Diagnosis not present

## 2022-06-13 DIAGNOSIS — M9903 Segmental and somatic dysfunction of lumbar region: Secondary | ICD-10-CM | POA: Diagnosis not present

## 2022-06-14 ENCOUNTER — Telehealth: Payer: Self-pay

## 2022-06-14 NOTE — Patient Outreach (Signed)
  Care Coordination   06/14/2022 Name: Shannon Kidd MRN: [id number] DOB: 22-Oct-1945   Care Coordination Outreach Attempts:  An unsuccessful telephone outreach was attempted today to offer the patient information about available care coordination services as a benefit of their health plan.   Follow Up Plan:  Additional outreach attempts will be made to offer the patient care coordination information and services.   Encounter Outcome:  No Answer  Care Coordination Interventions Activated:  No   Care Coordination Interventions:  No, not indicated    Ms [facility] Care Management [phone]

## 2022-06-18 DIAGNOSIS — M546 Pain in thoracic spine: Secondary | ICD-10-CM | POA: Diagnosis not present

## 2022-06-18 DIAGNOSIS — M542 Cervicalgia: Secondary | ICD-10-CM | POA: Diagnosis not present

## 2022-06-18 DIAGNOSIS — M9902 Segmental and somatic dysfunction of thoracic region: Secondary | ICD-10-CM | POA: Diagnosis not present

## 2022-06-18 DIAGNOSIS — M9903 Segmental and somatic dysfunction of lumbar region: Secondary | ICD-10-CM | POA: Diagnosis not present

## 2022-06-20 ENCOUNTER — Ambulatory Visit: Payer: Self-pay

## 2022-06-20 DIAGNOSIS — M546 Pain in thoracic spine: Secondary | ICD-10-CM | POA: Diagnosis not present

## 2022-06-20 DIAGNOSIS — M9902 Segmental and somatic dysfunction of thoracic region: Secondary | ICD-10-CM | POA: Diagnosis not present

## 2022-06-20 DIAGNOSIS — M542 Cervicalgia: Secondary | ICD-10-CM | POA: Diagnosis not present

## 2022-06-20 DIAGNOSIS — M9903 Segmental and somatic dysfunction of lumbar region: Secondary | ICD-10-CM | POA: Diagnosis not present

## 2022-06-25 DIAGNOSIS — M542 Cervicalgia: Secondary | ICD-10-CM | POA: Diagnosis not present

## 2022-06-25 DIAGNOSIS — M9903 Segmental and somatic dysfunction of lumbar region: Secondary | ICD-10-CM | POA: Diagnosis not present

## 2022-06-25 DIAGNOSIS — M546 Pain in thoracic spine: Secondary | ICD-10-CM | POA: Diagnosis not present

## 2022-06-25 DIAGNOSIS — M9902 Segmental and somatic dysfunction of thoracic region: Secondary | ICD-10-CM | POA: Diagnosis not present

## 2022-06-27 DIAGNOSIS — M9902 Segmental and somatic dysfunction of thoracic region: Secondary | ICD-10-CM | POA: Diagnosis not present

## 2022-06-27 DIAGNOSIS — M542 Cervicalgia: Secondary | ICD-10-CM | POA: Diagnosis not present

## 2022-06-27 DIAGNOSIS — M9903 Segmental and somatic dysfunction of lumbar region: Secondary | ICD-10-CM | POA: Diagnosis not present

## 2022-06-27 DIAGNOSIS — M546 Pain in thoracic spine: Secondary | ICD-10-CM | POA: Diagnosis not present

## 2022-06-28 DIAGNOSIS — I83813 Varicose veins of bilateral lower extremities with pain: Secondary | ICD-10-CM | POA: Diagnosis not present

## 2022-06-28 DIAGNOSIS — I890 Lymphedema, not elsewhere classified: Secondary | ICD-10-CM | POA: Diagnosis not present

## 2022-06-28 DIAGNOSIS — I5032 Chronic diastolic (congestive) heart failure: Secondary | ICD-10-CM | POA: Diagnosis not present

## 2022-06-28 DIAGNOSIS — E119 Type 2 diabetes mellitus without complications: Secondary | ICD-10-CM | POA: Diagnosis not present

## 2022-06-28 DIAGNOSIS — L600 Ingrowing nail: Secondary | ICD-10-CM | POA: Diagnosis not present

## 2022-06-28 DIAGNOSIS — N183 Chronic kidney disease, stage 3 unspecified: Secondary | ICD-10-CM | POA: Diagnosis not present

## 2022-07-03 DIAGNOSIS — M542 Cervicalgia: Secondary | ICD-10-CM | POA: Diagnosis not present

## 2022-07-03 DIAGNOSIS — M9903 Segmental and somatic dysfunction of lumbar region: Secondary | ICD-10-CM | POA: Diagnosis not present

## 2022-07-03 DIAGNOSIS — M9902 Segmental and somatic dysfunction of thoracic region: Secondary | ICD-10-CM | POA: Diagnosis not present

## 2022-07-03 DIAGNOSIS — M546 Pain in thoracic spine: Secondary | ICD-10-CM | POA: Diagnosis not present

## 2022-07-04 DIAGNOSIS — M542 Cervicalgia: Secondary | ICD-10-CM | POA: Diagnosis not present

## 2022-07-04 DIAGNOSIS — M9902 Segmental and somatic dysfunction of thoracic region: Secondary | ICD-10-CM | POA: Diagnosis not present

## 2022-07-04 DIAGNOSIS — M7989 Other specified soft tissue disorders: Secondary | ICD-10-CM | POA: Diagnosis not present

## 2022-07-04 DIAGNOSIS — M546 Pain in thoracic spine: Secondary | ICD-10-CM | POA: Diagnosis not present

## 2022-07-04 DIAGNOSIS — M9903 Segmental and somatic dysfunction of lumbar region: Secondary | ICD-10-CM | POA: Diagnosis not present

## 2022-07-05 ENCOUNTER — Ambulatory Visit: Payer: Medicare Other | Admitting: Podiatry

## 2022-07-06 ENCOUNTER — Encounter: Payer: Self-pay | Admitting: Podiatry

## 2022-07-06 ENCOUNTER — Ambulatory Visit: Payer: Medicare Other | Admitting: Podiatry

## 2022-07-06 DIAGNOSIS — L03032 Cellulitis of left toe: Secondary | ICD-10-CM

## 2022-07-06 DIAGNOSIS — M79609 Pain in unspecified limb: Secondary | ICD-10-CM

## 2022-07-06 DIAGNOSIS — B351 Tinea unguium: Secondary | ICD-10-CM | POA: Diagnosis not present

## 2022-07-06 DIAGNOSIS — I999 Unspecified disorder of circulatory system: Secondary | ICD-10-CM

## 2022-07-06 NOTE — Progress Notes (Signed)
Subjective:   Patient ID: Shannon Kidd, female   DOB: 76 y.o.   MRN: [id number]   HPI Patient presents stating that she has had a lot of soreness with some redness and drainage in the left big toenail and its not draining as much but still sore and she has thick yellow brittle nailbeds 1-5 both feet and does not have good circulatory status   ROS      Objective:  Physical Exam  Vascular status reveals not intact DP PT pulses and she is watched for this with patient having history of stents with a incurvated left hallux lateral border painful when pressed with redness low-grade localized drainage all other nails are very thickened and dystrophic can become painful     Assessment:  Paronychia infection localized left hallux lateral border with thick yellow brittle nailbeds 1-5 right 2-5 left     Plan:  H&P reviewed both conditions do not want to do anything aggressive but I did go ahead I anesthetized the left hallux sterile prep done I remove the lateral border removed proud flesh abscess tissue I flushed out the area applied sterile dressing and then I debrided nails 1-5 right 2 through 5 left that were painful no iatrogenic bleeding patient will be seen back as needed

## 2022-07-06 NOTE — Patient Instructions (Signed)

## 2022-07-17 DIAGNOSIS — M546 Pain in thoracic spine: Secondary | ICD-10-CM | POA: Diagnosis not present

## 2022-07-17 DIAGNOSIS — M9902 Segmental and somatic dysfunction of thoracic region: Secondary | ICD-10-CM | POA: Diagnosis not present

## 2022-07-17 DIAGNOSIS — M9903 Segmental and somatic dysfunction of lumbar region: Secondary | ICD-10-CM | POA: Diagnosis not present

## 2022-07-17 DIAGNOSIS — M542 Cervicalgia: Secondary | ICD-10-CM | POA: Diagnosis not present

## 2022-07-31 DIAGNOSIS — M9902 Segmental and somatic dysfunction of thoracic region: Secondary | ICD-10-CM | POA: Diagnosis not present

## 2022-07-31 DIAGNOSIS — M9903 Segmental and somatic dysfunction of lumbar region: Secondary | ICD-10-CM | POA: Diagnosis not present

## 2022-07-31 DIAGNOSIS — M546 Pain in thoracic spine: Secondary | ICD-10-CM | POA: Diagnosis not present

## 2022-07-31 DIAGNOSIS — M542 Cervicalgia: Secondary | ICD-10-CM | POA: Diagnosis not present

## 2022-08-03 DIAGNOSIS — Z20822 Contact with and (suspected) exposure to covid-19: Secondary | ICD-10-CM | POA: Diagnosis not present

## 2022-08-03 DIAGNOSIS — R051 Acute cough: Secondary | ICD-10-CM | POA: Diagnosis not present

## 2022-08-07 DIAGNOSIS — M9902 Segmental and somatic dysfunction of thoracic region: Secondary | ICD-10-CM | POA: Diagnosis not present

## 2022-08-07 DIAGNOSIS — M542 Cervicalgia: Secondary | ICD-10-CM | POA: Diagnosis not present

## 2022-08-07 DIAGNOSIS — M546 Pain in thoracic spine: Secondary | ICD-10-CM | POA: Diagnosis not present

## 2022-08-07 DIAGNOSIS — M9903 Segmental and somatic dysfunction of lumbar region: Secondary | ICD-10-CM | POA: Diagnosis not present

## 2022-08-08 DIAGNOSIS — Z23 Encounter for immunization: Secondary | ICD-10-CM | POA: Diagnosis not present

## 2022-08-08 DIAGNOSIS — G2581 Restless legs syndrome: Secondary | ICD-10-CM | POA: Diagnosis not present

## 2022-08-08 DIAGNOSIS — E7800 Pure hypercholesterolemia, unspecified: Secondary | ICD-10-CM | POA: Diagnosis not present

## 2022-08-08 DIAGNOSIS — M8580 Other specified disorders of bone density and structure, unspecified site: Secondary | ICD-10-CM | POA: Diagnosis not present

## 2022-08-08 DIAGNOSIS — Z0000 Encounter for general adult medical examination without abnormal findings: Secondary | ICD-10-CM | POA: Diagnosis not present

## 2022-08-08 DIAGNOSIS — I10 Essential (primary) hypertension: Secondary | ICD-10-CM | POA: Diagnosis not present

## 2022-08-13 DIAGNOSIS — L97811 Non-pressure chronic ulcer of other part of right lower leg limited to breakdown of skin: Secondary | ICD-10-CM | POA: Diagnosis not present

## 2022-08-13 DIAGNOSIS — I83813 Varicose veins of bilateral lower extremities with pain: Secondary | ICD-10-CM | POA: Diagnosis not present

## 2022-08-13 DIAGNOSIS — E119 Type 2 diabetes mellitus without complications: Secondary | ICD-10-CM | POA: Diagnosis not present

## 2022-08-13 DIAGNOSIS — N183 Chronic kidney disease, stage 3 unspecified: Secondary | ICD-10-CM | POA: Diagnosis not present

## 2022-08-13 DIAGNOSIS — I890 Lymphedema, not elsewhere classified: Secondary | ICD-10-CM | POA: Diagnosis not present

## 2022-08-13 DIAGNOSIS — I5032 Chronic diastolic (congestive) heart failure: Secondary | ICD-10-CM | POA: Diagnosis not present

## 2022-08-14 DIAGNOSIS — M9903 Segmental and somatic dysfunction of lumbar region: Secondary | ICD-10-CM | POA: Diagnosis not present

## 2022-08-14 DIAGNOSIS — M542 Cervicalgia: Secondary | ICD-10-CM | POA: Diagnosis not present

## 2022-08-14 DIAGNOSIS — M9902 Segmental and somatic dysfunction of thoracic region: Secondary | ICD-10-CM | POA: Diagnosis not present

## 2022-08-14 DIAGNOSIS — M546 Pain in thoracic spine: Secondary | ICD-10-CM | POA: Diagnosis not present

## 2022-08-14 DIAGNOSIS — L97811 Non-pressure chronic ulcer of other part of right lower leg limited to breakdown of skin: Secondary | ICD-10-CM | POA: Diagnosis not present

## 2022-08-20 ENCOUNTER — Other Ambulatory Visit: Payer: Self-pay | Admitting: Cardiology

## 2022-08-20 DIAGNOSIS — I350 Nonrheumatic aortic (valve) stenosis: Secondary | ICD-10-CM

## 2022-09-05 DIAGNOSIS — M9903 Segmental and somatic dysfunction of lumbar region: Secondary | ICD-10-CM | POA: Diagnosis not present

## 2022-09-05 DIAGNOSIS — M9902 Segmental and somatic dysfunction of thoracic region: Secondary | ICD-10-CM | POA: Diagnosis not present

## 2022-09-05 DIAGNOSIS — M542 Cervicalgia: Secondary | ICD-10-CM | POA: Diagnosis not present

## 2022-09-05 DIAGNOSIS — M546 Pain in thoracic spine: Secondary | ICD-10-CM | POA: Diagnosis not present

## 2022-09-10 ENCOUNTER — Other Ambulatory Visit: Payer: Medicare Other

## 2022-09-12 ENCOUNTER — Ambulatory Visit: Payer: Medicare Other | Admitting: Endocrinology

## 2022-09-14 DIAGNOSIS — I83813 Varicose veins of bilateral lower extremities with pain: Secondary | ICD-10-CM | POA: Diagnosis not present

## 2022-09-14 DIAGNOSIS — I5032 Chronic diastolic (congestive) heart failure: Secondary | ICD-10-CM | POA: Diagnosis not present

## 2022-09-14 DIAGNOSIS — L97811 Non-pressure chronic ulcer of other part of right lower leg limited to breakdown of skin: Secondary | ICD-10-CM | POA: Diagnosis not present

## 2022-09-14 DIAGNOSIS — I890 Lymphedema, not elsewhere classified: Secondary | ICD-10-CM | POA: Diagnosis not present

## 2022-09-14 DIAGNOSIS — E119 Type 2 diabetes mellitus without complications: Secondary | ICD-10-CM | POA: Diagnosis not present

## 2022-09-14 DIAGNOSIS — N183 Chronic kidney disease, stage 3 unspecified: Secondary | ICD-10-CM | POA: Diagnosis not present

## 2022-09-19 DIAGNOSIS — M9903 Segmental and somatic dysfunction of lumbar region: Secondary | ICD-10-CM | POA: Diagnosis not present

## 2022-09-19 DIAGNOSIS — M9902 Segmental and somatic dysfunction of thoracic region: Secondary | ICD-10-CM | POA: Diagnosis not present

## 2022-09-19 DIAGNOSIS — M542 Cervicalgia: Secondary | ICD-10-CM | POA: Diagnosis not present

## 2022-09-19 DIAGNOSIS — M546 Pain in thoracic spine: Secondary | ICD-10-CM | POA: Diagnosis not present

## 2022-09-24 DIAGNOSIS — E7800 Pure hypercholesterolemia, unspecified: Secondary | ICD-10-CM | POA: Diagnosis not present

## 2022-09-24 DIAGNOSIS — I10 Essential (primary) hypertension: Secondary | ICD-10-CM | POA: Diagnosis not present

## 2022-09-24 DIAGNOSIS — N183 Chronic kidney disease, stage 3 unspecified: Secondary | ICD-10-CM | POA: Diagnosis not present

## 2022-09-24 DIAGNOSIS — E1165 Type 2 diabetes mellitus with hyperglycemia: Secondary | ICD-10-CM | POA: Diagnosis not present

## 2022-10-22 NOTE — Progress Notes (Deleted)
HPI: FU AS s/p TAVR. Patient had follow-up echocardiogram August 2019 that showed normal LV function and severe aortic stenosis with mean gradient 45 mmHg. There was moderate tricuspid regurgitation. Cardiac catheterization August 2019 showed calcified coronaries but only mild nonobstructive disease. There was severe aortic stenosis and moderate pulmonary hypertension felt secondary to elevated left heart pressures. Had TAVR October 2019. Carotid Dopplers October 2022 showed 1 to 39% bilateral stenosis.  Echocardiogram December 2022 showed normal LV function, mild left ventricular hypertrophy, grade 1 diastolic dysfunction, mild biatrial enlargement, prior TAVR with mean gradient 14 mmHg and no aortic insufficiency.  Since last seen  Current Outpatient Medications  Medication Sig Dispense Refill   albuterol (PROVENTIL HFA;VENTOLIN HFA) 108 (90 Base) MCG/ACT inhaler Inhale 2 puffs into the lungs every 6 (six) hours as needed for wheezing or shortness of breath. (Patient not taking: Reported on 11/02/2021)     amLODipine (NORVASC) 5 MG tablet TAKE ONE TABLET BY MOUTH EVERY MORNING 180 tablet 3   Ascorbic Acid (VITAMIN C) 1000 MG tablet Take 1,000 mg by mouth daily.     aspirin 81 MG tablet Take 81 mg by mouth daily.     Blood Glucose Monitoring Suppl (ONETOUCH VERIO IQ SYSTEM) w/Device KIT USE TO CHECK BLOOD SUGAR ONCE DAILY Dx code E11.9 1 kit 2   Cholecalciferol (VITAMIN D) 2000 UNITS tablet Take 2,000 Units by mouth daily.     glimepiride (AMARYL) 1 MG tablet TAKE ONE TABLET BY MOUTH EVERYDAY AT BEDTIME 90 tablet 1   losartan (COZAAR) 100 MG tablet Take 1 tablet (100 mg total) by mouth daily. (Patient taking differently: Take 50 mg by mouth daily.) 90 tablet 3   metFORMIN (GLUCOPHAGE-XR) 500 MG 24 hr tablet Take 1 tablet by mouth every evening 90 tablet 3   ONETOUCH DELICA LANCETS 99991111 MISC USE TO CHECK BLOOD SUGAR ONCE DAILY Dx code E11.9 100 each 2   ONETOUCH VERIO test strip USE 1 STRIP TO  CHECK GLUCOSE ONCE DAILY 50 each 0   pramipexole (MIRAPEX) 0.25 MG tablet Take 0.25 mg by mouth at bedtime.      rosuvastatin (CRESTOR) 20 MG tablet TAKE ONE TABLET BY MOUTH EVERYDAY AT BEDTIME 90 tablet 1   sertraline (ZOLOFT) 50 MG tablet Take 50 mg by mouth daily.     sodium bicarbonate 650 MG tablet Take 650 mg by mouth 3 (three) times daily.     vitamin B-12 (CYANOCOBALAMIN) 1000 MCG tablet Take 1,000 mcg by mouth daily.     No current facility-administered medications for this visit.     Past Medical History:  Diagnosis Date   Anxiety    Cervical myelopathy (HCC)    s/p cervical fusion @ [facility]   Chronic diastolic congestive heart failure (HCC)    CKD (chronic kidney disease), stage III (HCC)    Diabetes mellitus ([place])    Glaucoma    HTN (hypertension)    Hyperlipemia    Morbid obesity (HCC)    Rheumatic fever    S/P TAVR (transcatheter aortic valve replacement) 07/29/2018   26 mm Edwards Sapien 3 transcatheter heart valve placed via percutaneous right transfemoral approach    Severe aortic stenosis     Past Surgical History:  Procedure Laterality Date   APPENDECTOMY     CARDIAC CATHETERIZATION     EYE SURGERY Bilateral    cataract extraction bilateral with lens   INTRAOPERATIVE TRANSTHORACIC ECHOCARDIOGRAM  07/29/2018   Procedure: INTRAOPERATIVE TRANSTHORACIC ECHOCARDIOGRAM;  Surgeon: Sherren Mocha,  MD;  Location: [facility];  Service: Open Heart Surgery;;   NECK SURGERY     RIGHT/LEFT HEART CATH AND CORONARY ANGIOGRAPHY N/A 06/04/2018   Procedure: RIGHT/LEFT HEART CATH AND CORONARY ANGIOGRAPHY;  Surgeon: Sherren Mocha, MD;  Location: [place] CV LAB;  Service: Cardiovascular;  Laterality: N/A;   TONSILLECTOMY     TRANSCATHETER AORTIC VALVE REPLACEMENT, TRANSFEMORAL N/A 07/29/2018   Procedure: TRANSCATHETER AORTIC VALVE REPLACEMENT, TRANSFEMORAL;  Surgeon: Sherren Mocha, MD;  Location: [facility];  Service: Open Heart Surgery;  Laterality: N/A;   TUBAL LIGATION      VAGINAL HYSTERECTOMY      Social History   Socioeconomic History   Marital status: Widowed    Spouse name: Not on file   Number of children: 2   Years of education: Not on file   Highest education level: Not on file  Occupational History   Not on file  Tobacco Use   Smoking status: Never   Smokeless tobacco: Never  Vaping Use   Vaping Use: Never used  Substance and Sexual Activity   Alcohol use: No    Alcohol/week: 0.0 standard drinks of alcohol   Drug use: Never   Sexual activity: Not on file  Other Topics Concern   Not on file  Social History Narrative   Not on file   Social Determinants of Health   Financial Resource Strain: Not on file  Food Insecurity: Not on file  Transportation Needs: Not on file  Physical Activity: Not on file  Stress: Not on file  Social Connections: Not on file  Intimate Partner Violence: Not on file    Family History  Problem Relation Age of Onset   Diabetes Mother    Cancer Mother        Colon   Diabetes Sister    Diabetes Paternal Grandmother    Breast cancer Maternal Grandmother 53    ROS: no fevers or chills, productive cough, hemoptysis, dysphasia, odynophagia, melena, hematochezia, dysuria, hematuria, rash, seizure activity, orthopnea, PND, pedal edema, claudication. Remaining systems are negative.  Physical Exam: Well-developed well-nourished in no acute distress.  Skin is warm and dry.  HEENT is normal.  Neck is supple.  Chest is clear to auscultation with normal expansion.  Cardiovascular exam is regular rate and rhythm.  Abdominal exam nontender or distended. No masses palpated. Extremities show no edema. neuro grossly intact  ECG- personally reviewed  A/P  1 previous TAVR-most recent echocardiogram showed normal LV function and normally functioning prosthetic aortic valve.  Continue SBE prophylaxis.  2 hypertension-blood pressure controlled.  Continue present medical regimen.  Check potassium and renal  function.  3 hyperlipidemia-continue statin.  Check lipids and liver.  4 carotid artery disease-mild on most recent carotid Dopplers.  5 obesity-we discussed the importance of weight loss.  Kirk Ruths, MD

## 2022-10-25 ENCOUNTER — Other Ambulatory Visit (INDEPENDENT_AMBULATORY_CARE_PROVIDER_SITE_OTHER): Payer: PPO

## 2022-10-25 DIAGNOSIS — E1165 Type 2 diabetes mellitus with hyperglycemia: Secondary | ICD-10-CM

## 2022-10-25 LAB — BASIC METABOLIC PANEL WITH GFR
BUN: 26 mg/dL — ABNORMAL HIGH (ref 6–23)
CO2: 28 meq/L (ref 19–32)
Calcium: 9.6 mg/dL (ref 8.4–10.5)
Chloride: 103 meq/L (ref 96–112)
Creatinine, Ser: 1.18 mg/dL (ref 0.40–1.20)
GFR: 44.9 mL/min — ABNORMAL LOW
Glucose, Bld: 155 mg/dL — ABNORMAL HIGH (ref 70–99)
Potassium: 4.9 meq/L (ref 3.5–5.1)
Sodium: 138 meq/L (ref 135–145)

## 2022-10-25 LAB — LIPID PANEL
Cholesterol: 73 mg/dL (ref 0–200)
HDL: 35.2 mg/dL — ABNORMAL LOW
LDL Cholesterol: 19 mg/dL (ref 0–99)
NonHDL: 37.38
Total CHOL/HDL Ratio: 2
Triglycerides: 90 mg/dL (ref 0.0–149.0)
VLDL: 18 mg/dL (ref 0.0–40.0)

## 2022-10-25 LAB — HEMOGLOBIN A1C: Hgb A1c MFr Bld: 7.9 % — ABNORMAL HIGH (ref 4.6–6.5)

## 2022-10-26 ENCOUNTER — Ambulatory Visit (INDEPENDENT_AMBULATORY_CARE_PROVIDER_SITE_OTHER): Payer: PPO | Admitting: Podiatry

## 2022-10-26 ENCOUNTER — Ambulatory Visit: Payer: Medicare Other | Admitting: Podiatry

## 2022-10-26 VITALS — BP 132/55

## 2022-10-26 DIAGNOSIS — E119 Type 2 diabetes mellitus without complications: Secondary | ICD-10-CM | POA: Diagnosis not present

## 2022-10-26 DIAGNOSIS — B351 Tinea unguium: Secondary | ICD-10-CM | POA: Diagnosis not present

## 2022-10-26 DIAGNOSIS — M79609 Pain in unspecified limb: Secondary | ICD-10-CM | POA: Diagnosis not present

## 2022-10-26 DIAGNOSIS — Z0189 Encounter for other specified special examinations: Secondary | ICD-10-CM

## 2022-10-26 NOTE — Progress Notes (Signed)
ANNUAL DIABETIC FOOT EXAM  Subjective: Shannon Kidd presents today for annual diabetic foot examination.  Chief Complaint  Patient presents with   Nail Problem    [facility] BS-106 yesterday A1C-7.5 PCP-Courtney Wharton PCP VST-Fall 2023    Patient confirms h/o diabetes.  Patient relates 22 year h/o diabetes.  Patient denies any h/o foot wounds.  She has h/o ingrown toenail infection.  Patient denies any numbness, tingling, burning, or pins/needle sensation in feet.  Risk factors: diabetes, HTN, CHF, hyperlipidemia, diabetic renal disease.  Jarrett Soho, PA-C is patient's PCP.   Past Medical History:  Diagnosis Date   Anxiety    Cervical myelopathy (HCC)    s/p cervical fusion @ [facility]   Chronic diastolic congestive heart failure (HCC)    CKD (chronic kidney disease), stage III (HCC)    Diabetes mellitus (HCC)    Glaucoma    HTN (hypertension)    Hyperlipemia    Morbid obesity (HCC)    Rheumatic fever    S/P TAVR (transcatheter aortic valve replacement) 07/29/2018   26 mm Edwards Sapien 3 transcatheter heart valve placed via percutaneous right transfemoral approach    Severe aortic stenosis    Patient Active Problem List   Diagnosis Date Noted   Non-pressure chronic ulcer of other part of right lower leg limited to breakdown of skin (HCC) 08/13/2022   Lymphedema of both lower extremities 04/03/2022   Hydronephrosis with urinary obstruction due to ureteral calculus 02/04/2022   Abnormal MRI, cervical spine 01/05/2021   Absolute anemia 01/05/2021   Anxiety 01/05/2021   Aortic valve disorder 01/05/2021   Bone/cartilage disorder 01/05/2021   Cervical disc disease 01/05/2021   Congenital velopharyngeal incompetence 01/05/2021   Diabetic renal disease (HCC) 01/05/2021   Hyperglycemia due to type 2 diabetes mellitus (HCC) 01/05/2021   Osteopenia 01/05/2021   Peripheral neuralgia 01/05/2021   Pulmonary hypertension (HCC) 01/05/2021   Restless legs 01/05/2021    Sixth nerve palsy 01/05/2021   Unspecified condition associated with female genital organs and menstrual cycle 01/05/2021   S/P TAVR (transcatheter aortic valve replacement) 07/29/2018   Presence of prosthetic heart valve 07/29/2018   Diabetes mellitus (HCC)    Severe aortic stenosis    Chronic diastolic congestive heart failure (HCC)    Kidney disease, chronic, stage III (moderate, EGFR 30-59 ml/min) (HCC) 05/28/2014   Essential hypertension, benign 05/28/2014   HLD (hyperlipidemia) 05/28/2014   Severe obesity (BMI >= 40) (HCC) 05/28/2014   S/P cervical spinal fusion 10/16/2012   Past Surgical History:  Procedure Laterality Date   APPENDECTOMY     CARDIAC CATHETERIZATION     EYE SURGERY Bilateral    cataract extraction bilateral with lens   INTRAOPERATIVE TRANSTHORACIC ECHOCARDIOGRAM  07/29/2018   Procedure: INTRAOPERATIVE TRANSTHORACIC ECHOCARDIOGRAM;  Surgeon: Tonny Bollman, MD;  Location: [facility] OR;  Service: Open Heart Surgery;;   NECK SURGERY     RIGHT/LEFT HEART CATH AND CORONARY ANGIOGRAPHY N/A 06/04/2018   Procedure: RIGHT/LEFT HEART CATH AND CORONARY ANGIOGRAPHY;  Surgeon: Tonny Bollman, MD;  Location: [facility] INVASIVE CV LAB;  Service: Cardiovascular;  Laterality: N/A;   TONSILLECTOMY     TRANSCATHETER AORTIC VALVE REPLACEMENT, TRANSFEMORAL N/A 07/29/2018   Procedure: TRANSCATHETER AORTIC VALVE REPLACEMENT, TRANSFEMORAL;  Surgeon: Tonny Bollman, MD;  Location: [facility] OR;  Service: Open Heart Surgery;  Laterality: N/A;   TUBAL LIGATION     VAGINAL HYSTERECTOMY     Current Outpatient Medications on File Prior to Visit  Medication Sig Dispense Refill   albuterol (PROVENTIL HFA;VENTOLIN  HFA) 108 (90 Base) MCG/ACT inhaler Inhale 2 puffs into the lungs every 6 (six) hours as needed for wheezing or shortness of breath. (Patient not taking: Reported on 11/02/2021)     amLODipine (NORVASC) 5 MG tablet TAKE ONE TABLET BY MOUTH EVERY MORNING 180 tablet 3   Ascorbic Acid (VITAMIN C) 1000  MG tablet Take 1,000 mg by mouth daily.     aspirin 81 MG tablet Take 81 mg by mouth daily.     Blood Glucose Monitoring Suppl (ONETOUCH VERIO IQ SYSTEM) w/Device KIT USE TO CHECK BLOOD SUGAR ONCE DAILY Dx code E11.9 1 kit 2   Cholecalciferol (VITAMIN D) 2000 UNITS tablet Take 2,000 Units by mouth daily.     glimepiride (AMARYL) 1 MG tablet TAKE ONE TABLET BY MOUTH EVERYDAY AT BEDTIME 90 tablet 1   losartan (COZAAR) 100 MG tablet Take 1 tablet (100 mg total) by mouth daily. (Patient taking differently: Take 50 mg by mouth daily.) 90 tablet 3   metFORMIN (GLUCOPHAGE-XR) 500 MG 24 hr tablet Take 1 tablet by mouth every evening 90 tablet 3   ONETOUCH DELICA LANCETS 03K MISC USE TO CHECK BLOOD SUGAR ONCE DAILY Dx code E11.9 100 each 2   ONETOUCH VERIO test strip USE 1 STRIP TO CHECK GLUCOSE ONCE DAILY 50 each 0   pramipexole (MIRAPEX) 0.25 MG tablet Take 0.25 mg by mouth at bedtime.      rosuvastatin (CRESTOR) 20 MG tablet TAKE ONE TABLET BY MOUTH EVERYDAY AT BEDTIME 90 tablet 1   sertraline (ZOLOFT) 50 MG tablet Take 50 mg by mouth daily.     sodium bicarbonate 650 MG tablet Take 650 mg by mouth 3 (three) times daily.     vitamin B-12 (CYANOCOBALAMIN) 1000 MCG tablet Take 1,000 mcg by mouth daily.     No current facility-administered medications on file prior to visit.    Allergies  Allergen Reactions   Actos [Pioglitazone] Swelling   Social History   Occupational History   Not on file  Tobacco Use   Smoking status: Never   Smokeless tobacco: Never  Vaping Use   Vaping Use: Never used  Substance and Sexual Activity   Alcohol use: No    Alcohol/week: 0.0 standard drinks of alcohol   Drug use: Never   Sexual activity: Not on file   Family History  Problem Relation Age of Onset   Diabetes Mother    Cancer Mother        Colon   Diabetes Sister    Diabetes Paternal Grandmother    Breast cancer Maternal Grandmother 6   Immunization History  Administered Date(s) Administered    Fluad Quad(high Dose 65+) 06/29/2021   Influenza Split 07/25/2012, 11/08/2017   Influenza, High Dose Seasonal PF 07/09/2019   Influenza,inj,Quad PF,6+ Mos 08/21/2011, 07/07/2015   Influenza,inj,Quad PF,6-35 Mos 07/18/2018   Influenza,inj,quad, With Preservative 07/14/2014   Influenza-Unspecified 07/14/2014, 07/17/2016   PFIZER(Purple Top)SARS-COV-2 Vaccination 11/22/2019, 12/22/2019, 08/20/2020   Pneumococcal Conjugate-13 03/04/2014   Pneumococcal Polysaccharide-23 10/21/2012   Tdap 10/25/2009     Review of Systems: Negative except as noted in the HPI.   Objective: Vitals:   10/26/22 0859  BP: (!) 132/55    LEATHA Kidd is a pleasant 77 y.o. female in NAD. AAO X 3.  Vascular Examination: Capillary refill time immediate b/l. Vascular status intact b/l with palpable pedal pulses. Pedal hair present b/l. No edema. No pain with calf compression b/l. Skin temperature gradient WNL b/l.   Neurological Examination: Sensation grossly  intact b/l with 10 gram monofilament. Vibratory sensation intact b/l.   Dermatological Examination: Pedal skin with normal turgor, texture and tone b/l.  No open wounds. No interdigital macerations.   Toenails 1-5 b/l thick, discolored, elongated with subungual debris and pain on dorsal palpation.   No hyperkeratotic nor porokeratotic lesions present on today's visit.  Musculoskeletal Examination: Muscle strength 5/5 to all lower extremity muscle groups bilaterally. No pain, crepitus or joint limitation noted with ROM bilateral LE. Utilizes cane for ambulation assistance.  Radiographs: None  Last A1c:      Latest Ref Rng & Units 10/25/2022    8:41 AM 02/19/2022    9:16 AM 10/30/2021   10:16 AM  Hemoglobin A1C  Hemoglobin-A1c 4.6 - 6.5 % 7.9  7.2  7.0    Footwear Assessment: Does the patient wear appropriate shoes? Yes. Does the patient need inserts/orthotics? No.  ADA Risk Categorization: Low Risk :  Patient has all of the following: Intact  protective sensation No prior foot ulcer  No severe deformity Pedal pulses present  Assessment: 1. Pain due to onychomycosis of nail   2. Type 2 diabetes mellitus without complication, without long-term current use of insulin ([place])   3. Encounter for diabetic foot exam [facility])     Plan: -Patient was evaluated and treated. All patient's and/or POA's questions/concerns answered on today's visit. -Diabetic foot examination performed today. -Continue foot and shoe inspections daily. Monitor blood glucose per PCP/Endocrinologist's recommendations. -Continue supportive shoe gear daily. -Toenails 1-5 b/l were debrided in length and girth with sterile nail nippers and dremel without iatrogenic bleeding.  -Patient/POA to call should there be question/concern in the interim. No follow-ups on file.  Marzetta Board, DPM

## 2022-10-29 ENCOUNTER — Encounter: Payer: Self-pay | Admitting: Podiatry

## 2022-10-30 ENCOUNTER — Encounter: Payer: Self-pay | Admitting: Endocrinology

## 2022-10-30 ENCOUNTER — Ambulatory Visit (INDEPENDENT_AMBULATORY_CARE_PROVIDER_SITE_OTHER): Payer: PPO | Admitting: Endocrinology

## 2022-10-30 VITALS — BP 138/62 | HR 62 | Ht 60.0 in | Wt 191.0 lb

## 2022-10-30 DIAGNOSIS — E782 Mixed hyperlipidemia: Secondary | ICD-10-CM | POA: Diagnosis not present

## 2022-10-30 DIAGNOSIS — E1165 Type 2 diabetes mellitus with hyperglycemia: Secondary | ICD-10-CM

## 2022-10-30 DIAGNOSIS — I10 Essential (primary) hypertension: Secondary | ICD-10-CM | POA: Diagnosis not present

## 2022-10-30 MED ORDER — METFORMIN HCL ER 500 MG PO TB24: ORAL_TABLET | ORAL | 1 refills | Status: DC

## 2022-10-30 NOTE — Patient Instructions (Addendum)
Check blood sugars on waking up 2-3 days a week  Also check blood sugars about 2 hours after meals and do this after different meals by rotation  Recommended blood sugar levels on waking up are 90-130 and about 2 hours after meal is 130-160  Please bring your blood sugar monitor to each visit, thank you  Take 2 Metformin

## 2022-10-30 NOTE — Progress Notes (Signed)
Patient ID: Shannon Kidd, female   DOB: 07-27-1946, 77 y.o.   MRN: [id number]           Reason for Appointment: Followup    History of Present Illness:          Diagnosis: Type 2 diabetes mellitus, date of diagnosis: 2000        Past history: She used Metformin for her diabetes for several years until early 2015 and was taking 1g bid before it was stopped. She thinks her blood sugars are well controlled with this. Also at some point she was given Actos but this was stopped because of edema. Metformin was stopped in early 2015 on the suggestion of a nephrologist because of her renal dysfunction Since early 2015 she had been taking Januvia 50 mg daily When her blood sugars were not well controlled with A1c of 8% in 2015 she was started on low dose metformin to control fasting hyperglycemia  Recent history:   Oral hypoglycemic drugs the patient is taking are: Amaryl 1 mg at dinnertime, 500 mg metformin ER at bedtime     Current management, blood sugar patterns and problems identified: Her A1c is 7.9 She has not been seen in follow-up since 5/23  She says her diet has been somewhat variable and occasionally has readings over 200 based on snacks or other carbohydrate intake in the evenings Fasting readings done around 6 AM are fairly good Lab glucose was after breakfast at 155 She has been taking her metformin only once a day which was reduced on her last visit .  Weight is about the same as before She is doing some walking at times No hypoglycemia from Amaryl    Side effects from medications have been: Diarrhea from high-dose or regular metformin  Glucose monitoring:  done <1 times a day         Glucometer: Verio      Blood Glucose readings by direct review of monitor:   PRE-MEAL Fasting Lunch Dinner Bedtime Overall  Glucose range: 106-130   93-203   Mean/median:     ?   POST-MEAL PC Breakfast PC Lunch PC Dinner  Glucose range:   ?  Mean/median:      Prior   PRE-MEAL  Fasting Lunch Dinner Bedtime Overall  Glucose range: 65-112  82    Mean/median:        POST-MEAL PC Breakfast PC Lunch PC Dinner  Glucose range:   171  Mean/median:         Self-care:      Meals:  2-3 meals per day.  Breakfast is a egg/toast      Dietician visit: Most recent: 8/15.               Weight history: 195-245 previously  Wt Readings from Last 3 Encounters:  10/30/22 191 lb (86.6 kg)  02/26/22 194 lb (88 kg)  11/02/21 193 lb (87.5 kg)    Lab Results  Component Value Date   HGBA1C 7.9 (H) 10/25/2022   HGBA1C 7.2 (H) 02/19/2022   HGBA1C 7.0 (H) 10/30/2021   Lab Results  Component Value Date   MICROALBUR 28.6 (H) 06/28/2021   LDLCALC 19 10/25/2022   CREATININE 1.18 10/25/2022    Allergies as of 10/30/2022       Reactions   Actos [pioglitazone] Swelling        Medication List        Accurate as of October 30, 2022  8:34 PM. If you have  any questions, ask your nurse or doctor.          STOP taking these medications    sodium bicarbonate 650 MG tablet       TAKE these medications    albuterol 108 (90 Base) MCG/ACT inhaler Commonly known as: VENTOLIN HFA Inhale 2 puffs into the lungs every 6 (six) hours as needed for wheezing or shortness of breath.   amLODipine 5 MG tablet Commonly known as: NORVASC TAKE ONE TABLET BY MOUTH EVERY MORNING   aspirin 81 MG tablet Take 81 mg by mouth daily.   cyanocobalamin 1000 MCG tablet Commonly known as: VITAMIN B12 Take 1,000 mcg by mouth daily.   glimepiride 1 MG tablet Commonly known as: AMARYL TAKE ONE TABLET BY MOUTH EVERYDAY AT BEDTIME   losartan 100 MG tablet Commonly known as: COZAAR Take 1 tablet (100 mg total) by mouth daily. What changed: how much to take   metFORMIN 500 MG 24 hr tablet Commonly known as: GLUCOPHAGE-XR Take 2 tablet by mouth every evening What changed: additional instructions   OneTouch Delica Lancets 74Q Misc USE TO CHECK BLOOD SUGAR ONCE DAILY Dx code E11.9    OneTouch Verio IQ System w/Device Kit USE TO CHECK BLOOD SUGAR ONCE DAILY Dx code E11.9   OneTouch Verio test strip Generic drug: glucose blood USE 1 STRIP TO CHECK GLUCOSE ONCE DAILY   pramipexole 0.25 MG tablet Commonly known as: MIRAPEX Take 0.25 mg by mouth at bedtime.   rosuvastatin 20 MG tablet Commonly known as: CRESTOR TAKE ONE TABLET BY MOUTH EVERYDAY AT BEDTIME   sertraline 50 MG tablet Commonly known as: ZOLOFT Take 50 mg by mouth daily.   vitamin C 1000 MG tablet Take 1,000 mg by mouth daily.   Vitamin D 50 MCG (2000 UT) tablet Take 2,000 Units by mouth daily.        Allergies:  Allergies  Allergen Reactions   Actos [Pioglitazone] Swelling    Past Medical History:  Diagnosis Date   Anxiety    Cervical myelopathy (HCC)    s/p cervical fusion @ [facility]   Chronic diastolic congestive heart failure (HCC)    CKD (chronic kidney disease), stage III (HCC)    Diabetes mellitus ([place])    Glaucoma    HTN (hypertension)    Hyperlipemia    Morbid obesity (HCC)    Rheumatic fever    S/P TAVR (transcatheter aortic valve replacement) 07/29/2018   26 mm Edwards Sapien 3 transcatheter heart valve placed via percutaneous right transfemoral approach    Severe aortic stenosis     Past Surgical History:  Procedure Laterality Date   APPENDECTOMY     CARDIAC CATHETERIZATION     EYE SURGERY Bilateral    cataract extraction bilateral with lens   INTRAOPERATIVE TRANSTHORACIC ECHOCARDIOGRAM  07/29/2018   Procedure: INTRAOPERATIVE TRANSTHORACIC ECHOCARDIOGRAM;  Surgeon: Sherren Mocha, MD;  Location: [place];  Service: Open Heart Surgery;;   NECK SURGERY     RIGHT/LEFT HEART CATH AND CORONARY ANGIOGRAPHY N/A 06/04/2018   Procedure: RIGHT/LEFT HEART CATH AND CORONARY ANGIOGRAPHY;  Surgeon: Sherren Mocha, MD;  Location: [place] [facility];  Service: Cardiovascular;  Laterality: N/A;   TONSILLECTOMY     TRANSCATHETER AORTIC VALVE REPLACEMENT, TRANSFEMORAL N/A  07/29/2018   Procedure: TRANSCATHETER AORTIC VALVE REPLACEMENT, TRANSFEMORAL;  Surgeon: Sherren Mocha, MD;  Location: [place];  Service: Open Heart Surgery;  Laterality: N/A;   TUBAL LIGATION     VAGINAL HYSTERECTOMY      Family History  Problem Relation Age of Onset   Diabetes Mother    Cancer Mother        Colon   Diabetes Sister    Diabetes Paternal Grandmother    Breast cancer Maternal Grandmother 72    Social History:  reports that she has never smoked. She has never used smokeless tobacco. She reports that she does not drink alcohol and does not use drugs.    Review of Systems       Lipids: She has been treated with Crestor 20 mg by cardiologist HDL tends to be low       Lab Results  Component Value Date   CHOL 73 10/25/2022   HDL 35.20 (L) 10/25/2022   LDLCALC 19 10/25/2022   TRIG 90.0 10/25/2022   CHOLHDL 2 10/25/2022                  The blood pressure has been treated with Norvasc 5 mg daily prescribed by her cardiologist Also taking 50 mg of losartan for blood pressure control  Blood pressure readings at home:  Recent range 130-140 systolic  BP Readings from Last 3 Encounters:  10/30/22 138/62  10/26/22 (!) 132/55  02/26/22 130/70        Diabetic foot exam done in 5/22 with normal findings   RENAL dysfunction: Although her creatinine had gone up to 1.53 again Blood pressure has not been low She also has had issues with hyperkalemia preoperatively and now potassium is  Also has had mild microalbuminuria, last microalbumin ratio 45  Lab Results  Component Value Date   K 4.9 10/25/2022     Lab Results  Component Value Date   CREATININE 1.18 10/25/2022   CREATININE 1.53 (H) 02/19/2022   CREATININE 1.19 10/30/2021    She is on vitamin D supplements which is followed by PCP   LABS:  Lab on 10/25/2022  Component Date Value Ref Range Status   Cholesterol 10/25/2022 73  0 - 200 mg/dL Final   ATP III Classification       Desirable:  < 200  mg/dL               Borderline High:  200 - 239 mg/dL          High:  > = 026 mg/dL   Triglycerides 37/85/8850 90.0  0.0 - 149.0 mg/dL Final   Normal:  <277 mg/dLBorderline High:  150 - 199 mg/dL   HDL 41/28/7867 67.20 (L)  >94.70 mg/dL Final   VLDL 96/28/3662 18.0  0.0 - 40.0 mg/dL Final   LDL Cholesterol 10/25/2022 19  0 - 99 mg/dL Final   Total CHOL/HDL Ratio 10/25/2022 2   Final                  Men          Women1/2 Average Risk     3.4          3.3Average Risk          5.0          4.42X Average Risk          9.6          7.13X Average Risk          15.0          11.0                       NonHDL 10/25/2022 37.38   Final  NOTE:  Non-HDL goal should be 30 mg/dL higher than patient's LDL goal (i.e. LDL goal of < 70 mg/dL, would have non-HDL goal of < 100 mg/dL)   Sodium 10/25/2022 138  135 - 145 mEq/L Final   Potassium 10/25/2022 4.9  3.5 - 5.1 mEq/L Final   Chloride 10/25/2022 103  96 - 112 mEq/L Final   CO2 10/25/2022 28  19 - 32 mEq/L Final   Glucose, Bld 10/25/2022 155 (H)  70 - 99 mg/dL Final   BUN 10/25/2022 26 (H)  6 - 23 mg/dL Final   Creatinine, Ser 10/25/2022 1.18  0.40 - 1.20 mg/dL Final   GFR 10/25/2022 44.90 (L)  >60.00 mL/min Final   Calculated using the CKD-EPI Creatinine Equation (2021)   Calcium 10/25/2022 9.6  8.4 - 10.5 mg/dL Final   Hgb A1c MFr Bld 10/25/2022 7.9 (H)  4.6 - 6.5 % Final   Glycemic Control Guidelines for People with Diabetes:Non Diabetic:  <6%Goal of Therapy: <7%Additional Action Suggested:  >8%     Physical Examination:  BP 138/62 (Patient Position: Standing)   Pulse 62   Ht 5' (1.524 m)   Wt 191 lb (86.6 kg)   SpO2 98%   BMI 37.30 kg/m    ASSESSMENT:  Diabetes type 2 with obesity  See history of present illness for discussion of current diabetes management, blood sugar patterns and problems identified  Her A1c is 7.9 She is on Amaryl and metformin 500 mg a day  Her blood sugars are not consistently high with occasional high  readings after dinner or evening snacks and likely A1c is higher from variable diet Also may be relatively higher because of reducing her metformin previously She is still not able to lose weight  RENAL dysfunction: Has significantly better creatinine and potassium Likely was having higher numbers with taking 100 mg of losartan  She will need follow-up microalbumin on the next visit, unable to void today  HYPERTENSION: Blood pressure is controlled with high reading initially from whitecoat syndrome  LIPIDS: Excellent control with total cholesterol only 73, currently managed by cardiology with high-dose Crestor  PLAN:   Take 2 metformin ER together at night More blood sugars about 2 hours after meals To call if blood sugars are consistently high Cut back on portions and high carbohydrate meals and snacks Continue checking blood pressure at home regularly  Will defer any adjustment of her rosuvastatin dose to cardiologist  Patient Instructions  Check blood sugars on waking up 2-3 days a week  Also check blood sugars about 2 hours after meals and do this after different meals by rotation  Recommended blood sugar levels on waking up are 90-130 and about 2 hours after meal is 130-160  Please bring your blood sugar monitor to each visit, thank you  Take 2 Metformin   Elayne Snare 10/30/2022, 8:34 PM   Note: This office note was prepared with Dragon voice recognition system technology. Any transcriptional errors that result from this process are unintentional.

## 2022-11-05 ENCOUNTER — Ambulatory Visit: Payer: Medicare Other | Admitting: Cardiology

## 2022-11-12 ENCOUNTER — Other Ambulatory Visit: Payer: Self-pay | Admitting: Endocrinology

## 2022-11-12 DIAGNOSIS — E1165 Type 2 diabetes mellitus with hyperglycemia: Secondary | ICD-10-CM

## 2022-11-21 ENCOUNTER — Other Ambulatory Visit (HOSPITAL_COMMUNITY): Payer: Self-pay

## 2022-11-22 ENCOUNTER — Other Ambulatory Visit (HOSPITAL_COMMUNITY): Payer: Self-pay

## 2022-11-22 MED ORDER — METFORMIN HCL ER 500 MG PO TB24
1000.0000 mg | ORAL_TABLET | Freq: Every evening | ORAL | 1 refills | Status: DC
  Filled 2022-11-22: qty 180, 90d supply, fill #0

## 2022-11-22 MED ORDER — LOSARTAN POTASSIUM 50 MG PO TABS
50.0000 mg | ORAL_TABLET | Freq: Every day | ORAL | 2 refills | Status: DC
  Filled 2022-11-22: qty 90, 90d supply, fill #0
  Filled 2023-02-16: qty 90, 90d supply, fill #1

## 2022-11-22 MED ORDER — GLIMEPIRIDE 1 MG PO TABS
1.0000 mg | ORAL_TABLET | Freq: Every day | ORAL | 2 refills | Status: DC
  Filled 2022-11-22: qty 90, 90d supply, fill #0

## 2022-11-22 MED ORDER — ROSUVASTATIN CALCIUM 20 MG PO TABS
20.0000 mg | ORAL_TABLET | Freq: Every day | ORAL | 1 refills | Status: DC
  Filled 2022-11-22: qty 90, 90d supply, fill #0

## 2022-11-22 MED ORDER — SERTRALINE HCL 50 MG PO TABS
75.0000 mg | ORAL_TABLET | Freq: Every day | ORAL | 2 refills | Status: DC
  Filled 2022-11-22: qty 135, 90d supply, fill #0
  Filled 2023-02-16: qty 135, 90d supply, fill #1
  Filled 2023-05-19: qty 135, 90d supply, fill #2

## 2022-11-22 MED ORDER — AMLODIPINE BESYLATE 5 MG PO TABS
5.0000 mg | ORAL_TABLET | Freq: Every morning | ORAL | 3 refills | Status: DC
  Filled 2022-11-22: qty 90, 90d supply, fill #0

## 2022-11-22 MED ORDER — PRAMIPEXOLE DIHYDROCHLORIDE 0.25 MG PO TABS
0.2500 mg | ORAL_TABLET | Freq: Every day | ORAL | 2 refills | Status: DC
  Filled 2022-11-22: qty 90, 90d supply, fill #0

## 2022-11-23 ENCOUNTER — Other Ambulatory Visit (HOSPITAL_COMMUNITY): Payer: Self-pay

## 2022-11-23 ENCOUNTER — Other Ambulatory Visit: Payer: Self-pay

## 2022-11-23 ENCOUNTER — Encounter (HOSPITAL_COMMUNITY): Payer: Self-pay

## 2022-11-27 DIAGNOSIS — G63 Polyneuropathy in diseases classified elsewhere: Secondary | ICD-10-CM | POA: Diagnosis not present

## 2022-11-27 DIAGNOSIS — E669 Obesity, unspecified: Secondary | ICD-10-CM | POA: Diagnosis not present

## 2022-11-27 DIAGNOSIS — E038 Other specified hypothyroidism: Secondary | ICD-10-CM | POA: Diagnosis not present

## 2022-11-27 DIAGNOSIS — E039 Hypothyroidism, unspecified: Secondary | ICD-10-CM | POA: Diagnosis not present

## 2022-11-27 DIAGNOSIS — F3341 Major depressive disorder, recurrent, in partial remission: Secondary | ICD-10-CM | POA: Diagnosis not present

## 2022-11-27 DIAGNOSIS — E538 Deficiency of other specified B group vitamins: Secondary | ICD-10-CM | POA: Diagnosis not present

## 2022-11-27 DIAGNOSIS — N183 Chronic kidney disease, stage 3 unspecified: Secondary | ICD-10-CM | POA: Diagnosis not present

## 2022-11-27 DIAGNOSIS — E1169 Type 2 diabetes mellitus with other specified complication: Secondary | ICD-10-CM | POA: Diagnosis not present

## 2022-11-27 DIAGNOSIS — E1122 Type 2 diabetes mellitus with diabetic chronic kidney disease: Secondary | ICD-10-CM | POA: Diagnosis not present

## 2022-11-27 DIAGNOSIS — E1142 Type 2 diabetes mellitus with diabetic polyneuropathy: Secondary | ICD-10-CM | POA: Diagnosis not present

## 2022-11-27 DIAGNOSIS — E559 Vitamin D deficiency, unspecified: Secondary | ICD-10-CM | POA: Diagnosis not present

## 2022-11-27 DIAGNOSIS — E785 Hyperlipidemia, unspecified: Secondary | ICD-10-CM | POA: Diagnosis not present

## 2022-11-28 ENCOUNTER — Other Ambulatory Visit (HOSPITAL_COMMUNITY): Payer: Self-pay

## 2022-11-28 DIAGNOSIS — R6889 Other general symptoms and signs: Secondary | ICD-10-CM | POA: Diagnosis not present

## 2022-11-28 DIAGNOSIS — Z7722 Contact with and (suspected) exposure to environmental tobacco smoke (acute) (chronic): Secondary | ICD-10-CM | POA: Diagnosis not present

## 2022-11-28 DIAGNOSIS — W07XXXA Fall from chair, initial encounter: Secondary | ICD-10-CM | POA: Diagnosis not present

## 2022-11-28 DIAGNOSIS — W19XXXA Unspecified fall, initial encounter: Secondary | ICD-10-CM | POA: Diagnosis not present

## 2022-11-28 DIAGNOSIS — F329 Major depressive disorder, single episode, unspecified: Secondary | ICD-10-CM | POA: Diagnosis not present

## 2022-11-28 DIAGNOSIS — G4489 Other headache syndrome: Secondary | ICD-10-CM | POA: Diagnosis not present

## 2022-11-28 DIAGNOSIS — I10 Essential (primary) hypertension: Secondary | ICD-10-CM | POA: Diagnosis not present

## 2022-11-28 DIAGNOSIS — S0093XA Contusion of unspecified part of head, initial encounter: Secondary | ICD-10-CM | POA: Diagnosis not present

## 2022-11-28 DIAGNOSIS — E785 Hyperlipidemia, unspecified: Secondary | ICD-10-CM | POA: Diagnosis not present

## 2022-11-28 DIAGNOSIS — E119 Type 2 diabetes mellitus without complications: Secondary | ICD-10-CM | POA: Diagnosis not present

## 2022-11-28 DIAGNOSIS — Z8673 Personal history of transient ischemic attack (TIA), and cerebral infarction without residual deficits: Secondary | ICD-10-CM | POA: Diagnosis not present

## 2022-11-28 DIAGNOSIS — Z743 Need for continuous supervision: Secondary | ICD-10-CM | POA: Diagnosis not present

## 2022-11-28 DIAGNOSIS — Z7982 Long term (current) use of aspirin: Secondary | ICD-10-CM | POA: Diagnosis not present

## 2022-11-28 DIAGNOSIS — R404 Transient alteration of awareness: Secondary | ICD-10-CM | POA: Diagnosis not present

## 2022-11-28 DIAGNOSIS — Z79899 Other long term (current) drug therapy: Secondary | ICD-10-CM | POA: Diagnosis not present

## 2022-11-28 DIAGNOSIS — Z888 Allergy status to other drugs, medicaments and biological substances status: Secondary | ICD-10-CM | POA: Diagnosis not present

## 2022-11-28 DIAGNOSIS — R55 Syncope and collapse: Secondary | ICD-10-CM | POA: Diagnosis not present

## 2022-11-28 DIAGNOSIS — S0083XA Contusion of other part of head, initial encounter: Secondary | ICD-10-CM | POA: Diagnosis not present

## 2022-11-28 DIAGNOSIS — Z7984 Long term (current) use of oral hypoglycemic drugs: Secondary | ICD-10-CM | POA: Diagnosis not present

## 2022-11-28 DIAGNOSIS — F419 Anxiety disorder, unspecified: Secondary | ICD-10-CM | POA: Diagnosis not present

## 2022-12-03 DIAGNOSIS — R2689 Other abnormalities of gait and mobility: Secondary | ICD-10-CM | POA: Diagnosis not present

## 2022-12-03 DIAGNOSIS — S0001XS Abrasion of scalp, sequela: Secondary | ICD-10-CM | POA: Diagnosis not present

## 2022-12-03 DIAGNOSIS — Z6837 Body mass index (BMI) 37.0-37.9, adult: Secondary | ICD-10-CM | POA: Diagnosis not present

## 2022-12-03 DIAGNOSIS — R55 Syncope and collapse: Secondary | ICD-10-CM | POA: Diagnosis not present

## 2022-12-03 DIAGNOSIS — Z9181 History of falling: Secondary | ICD-10-CM | POA: Diagnosis not present

## 2022-12-14 NOTE — Progress Notes (Signed)
HPI:FU AS s/p TAVR. Patient had follow-up echocardiogram August 2019 that showed normal LV function and severe aortic stenosis with mean gradient 45 mmHg. There was moderate tricuspid regurgitation. Cardiac catheterization August 2019 showed calcified coronaries but only mild nonobstructive disease. There was severe aortic stenosis and moderate pulmonary hypertension felt secondary to elevated left heart pressures. Had TAVR October 2019. Carotid Dopplers October 2022 showed 1 to 39% bilateral stenosis.  Echocardiogram December 2022 showed normal LV function, mild left ventricular hypertrophy, grade 1 diastolic dysfunction, mild biatrial enlargement, status post TAVR with mean gradient 14 mmHg and no aortic insufficiency.  Since last seen patient denies increased dyspnea, chest pain but does have occasional mild pedal edema.  She has fallen several times recently.  She states it is a problem with her balance.  However after the third fall she did not strike her head and was seen in the emergency room.  She went to sleep and then found herself on the floor later that evening.  Unclear whether she had syncope.  Current Outpatient Medications  Medication Sig Dispense Refill   amLODipine (NORVASC) 5 MG tablet TAKE ONE TABLET BY MOUTH EVERY MORNING 180 tablet 3   Ascorbic Acid (VITAMIN C) 1000 MG tablet Take 1,000 mg by mouth daily.     aspirin 81 MG tablet Take 81 mg by mouth daily.     Blood Glucose Monitoring Suppl (ONETOUCH VERIO IQ SYSTEM) w/Device KIT USE TO CHECK BLOOD SUGAR ONCE DAILY Dx code E11.9 1 kit 2   Cholecalciferol (VITAMIN D) 2000 UNITS tablet Take 2,000 Units by mouth daily.     glimepiride (AMARYL) 1 MG tablet TAKE ONE TABLET BY MOUTH EVERYDAY AT BEDTIME 90 tablet 2   losartan (COZAAR) 50 MG tablet Take 1 tablet (50 mg total) by mouth daily. 90 tablet 2   metFORMIN (GLUCOPHAGE-XR) 500 MG 24 hr tablet Take 2 tablet by mouth every evening 180 tablet 1   metFORMIN (GLUCOPHAGE-XR) 500  MG 24 hr tablet Take 2 tablets (1,000 mg total) by mouth every evening. 180 tablet 1   ONETOUCH DELICA LANCETS 99991111 MISC USE TO CHECK BLOOD SUGAR ONCE DAILY Dx code E11.9 100 each 2   ONETOUCH VERIO test strip USE 1 STRIP TO CHECK GLUCOSE ONCE DAILY 50 each 0   pramipexole (MIRAPEX) 0.25 MG tablet Take 0.25 mg by mouth at bedtime.      pramipexole (MIRAPEX) 0.25 MG tablet Take 1 tablet (0.25 mg total) by mouth at bedtime. 90 tablet 2   rosuvastatin (CRESTOR) 20 MG tablet TAKE ONE TABLET BY MOUTH EVERYDAY AT BEDTIME 90 tablet 1   rosuvastatin (CRESTOR) 20 MG tablet Take 1 tablet (20 mg total) by mouth at bedtime. 90 tablet 1   sertraline (ZOLOFT) 50 MG tablet Take 1.5 tablets (75 mg total) by mouth at bedtime. 135 tablet 2   vitamin B-12 (CYANOCOBALAMIN) 1000 MCG tablet Take 1,000 mcg by mouth daily.     No current facility-administered medications for this visit.     Past Medical History:  Diagnosis Date   Anxiety    Cervical myelopathy (HCC)    s/p cervical fusion @ [facility]   Chronic diastolic congestive heart failure (HCC)    CKD (chronic kidney disease), stage III (HCC)    Diabetes mellitus ([place])    Glaucoma    HTN (hypertension)    Hyperlipemia    Morbid obesity (HCC)    Rheumatic fever    S/P TAVR (transcatheter aortic valve replacement) 07/29/2018  26 mm Edwards Sapien 3 transcatheter heart valve placed via percutaneous right transfemoral approach    Severe aortic stenosis     Past Surgical History:  Procedure Laterality Date   APPENDECTOMY     CARDIAC CATHETERIZATION     EYE SURGERY Bilateral    cataract extraction bilateral with lens   INTRAOPERATIVE TRANSTHORACIC ECHOCARDIOGRAM  07/29/2018   Procedure: INTRAOPERATIVE TRANSTHORACIC ECHOCARDIOGRAM;  Surgeon: Sherren Mocha, MD;  Location: [place];  Service: Open Heart Surgery;;   NECK SURGERY     RIGHT/LEFT HEART CATH AND CORONARY ANGIOGRAPHY N/A 06/04/2018   Procedure: RIGHT/LEFT HEART CATH AND CORONARY  ANGIOGRAPHY;  Surgeon: Sherren Mocha, MD;  Location: [place] [facility];  Service: Cardiovascular;  Laterality: N/A;   TONSILLECTOMY     TRANSCATHETER AORTIC VALVE REPLACEMENT, TRANSFEMORAL N/A 07/29/2018   Procedure: TRANSCATHETER AORTIC VALVE REPLACEMENT, TRANSFEMORAL;  Surgeon: Sherren Mocha, MD;  Location: [place];  Service: Open Heart Surgery;  Laterality: N/A;   TUBAL LIGATION     VAGINAL HYSTERECTOMY      Social History   Socioeconomic History   Marital status: Widowed    Spouse name: Not on file   Number of children: 2   Years of education: Not on file   Highest education level: Not on file  Occupational History   Not on file  Tobacco Use   Smoking status: Never   Smokeless tobacco: Never  Vaping Use   Vaping Use: Never used  Substance and Sexual Activity   Alcohol use: No    Alcohol/week: 0.0 standard drinks of alcohol   Drug use: Never   Sexual activity: Not on file  Other Topics Concern   Not on file  Social History Narrative   Not on file   Social Determinants of Health   Financial Resource Strain: Not on file  Food Insecurity: Not on file  Transportation Needs: Not on file  Physical Activity: Not on file  Stress: Not on file  Social Connections: Not on file  Intimate Partner Violence: Not on file    Family History  Problem Relation Age of Onset   Diabetes Mother    Cancer Mother        Colon   Diabetes Sister    Diabetes Paternal Grandmother    Breast cancer Maternal Grandmother 97    ROS: no fevers or chills, productive cough, hemoptysis, dysphasia, odynophagia, melena, hematochezia, dysuria, hematuria, rash, seizure activity, orthopnea, PND, pedal edema, claudication. Remaining systems are negative.  Physical Exam: Well-developed well-nourished in no acute distress.  Skin is warm and dry.  HEENT is normal.  Neck is supple.  Chest is clear to auscultation with normal expansion.  Cardiovascular exam is regular rate and rhythm.  2/6 systolic  murmur left sternal border.  No diastolic murmur. Abdominal exam nontender or distended. No masses palpated. Extremities show trace edema. neuro grossly intact  ECG-sinus bradycardia at a rate of 56, cannot rule out anterior infarct, no ST changes.  Personally reviewed  A/P  1 status post TAVR-continue SBE prophylaxis.  Most recent echocardiogram showed normally functioning valve.  2 hypertension-blood pressure controlled.  Continue present medications.  3 hyperlipidemia-continue statin.  Recent laboratory showed total cholesterol 73 with LDL 19.  Check liver functions.  4 carotid artery disease-mild on most recent Dopplers.  5 recent falls-this appears to be predominantly a balance issue.  However 1 time she did find herself on the floor for unclear reasons after she was asleep.  Not clear that she has had syncope.  However we will plan echocardiogram to reassess LV function and aortic valve replacement.  Can consider a monitor in the future if necessary.  Kirk Ruths, MD

## 2022-12-24 ENCOUNTER — Ambulatory Visit (INDEPENDENT_AMBULATORY_CARE_PROVIDER_SITE_OTHER): Payer: HMO | Admitting: Cardiology

## 2022-12-24 ENCOUNTER — Encounter: Payer: Self-pay | Admitting: Cardiology

## 2022-12-24 VITALS — BP 150/77 | HR 56 | Ht 60.0 in | Wt 195.0 lb

## 2022-12-24 DIAGNOSIS — Z952 Presence of prosthetic heart valve: Secondary | ICD-10-CM | POA: Diagnosis not present

## 2022-12-24 DIAGNOSIS — I6523 Occlusion and stenosis of bilateral carotid arteries: Secondary | ICD-10-CM

## 2022-12-24 DIAGNOSIS — E7800 Pure hypercholesterolemia, unspecified: Secondary | ICD-10-CM

## 2022-12-24 DIAGNOSIS — I10 Essential (primary) hypertension: Secondary | ICD-10-CM

## 2022-12-24 NOTE — Patient Instructions (Signed)
  Testing/Procedures:  Your physician has requested that you have an echocardiogram. Echocardiography is a painless test that uses sound waves to create images of your heart. It provides your doctor with information about the size and shape of your heart and how well your heart's chambers and valves are working. This procedure takes approximately one hour. There are no restrictions for this procedure. Please do NOT wear cologne, perfume, aftershave, or lotions (deodorant is allowed). Please arrive 15 minutes prior to your appointment time. [address]   Follow-Up: At [facility], you and your health needs are our priority.  As part of our continuing mission to provide you with exceptional heart care, we have created designated Provider Care Teams.  These Care Teams include your primary Cardiologist (physician) and Advanced Practice Providers (APPs -  Physician Assistants and Nurse Practitioners) who all work together to provide you with the care you need, when you need it.  We recommend signing up for the patient portal called "MyChart".  Sign up information is provided on this After Visit Summary.  MyChart is used to connect with patients for Virtual Visits (Telemedicine).  Patients are able to view lab/test results, encounter notes, upcoming appointments, etc.  Non-urgent messages can be sent to your provider as well.   To learn more about what you can do with MyChart, go to [email or web address].    Your next appointment:   6 month(s)  Provider:   Brian Crenshaw, MD     

## 2022-12-25 LAB — HEPATIC FUNCTION PANEL
ALT: 12 [IU]/L (ref 0–32)
AST: 16 [IU]/L (ref 0–40)
Albumin: 4.1 g/dL (ref 3.8–4.8)
Alkaline Phosphatase: 110 [IU]/L (ref 44–121)
Bilirubin Total: 0.2 mg/dL (ref 0.0–1.2)
Bilirubin, Direct: <0.1 mg/dL (ref 0.00–0.40)
Total Protein: 7.3 g/dL (ref 6.0–8.5)

## 2022-12-27 ENCOUNTER — Encounter: Payer: Self-pay | Admitting: *Deleted

## 2022-12-28 DIAGNOSIS — R262 Difficulty in walking, not elsewhere classified: Secondary | ICD-10-CM | POA: Diagnosis not present

## 2022-12-28 DIAGNOSIS — R2689 Other abnormalities of gait and mobility: Secondary | ICD-10-CM | POA: Diagnosis not present

## 2023-01-07 DIAGNOSIS — R262 Difficulty in walking, not elsewhere classified: Secondary | ICD-10-CM | POA: Diagnosis not present

## 2023-01-17 ENCOUNTER — Ambulatory Visit (HOSPITAL_COMMUNITY): Payer: HMO | Attending: Cardiology

## 2023-01-17 DIAGNOSIS — Z952 Presence of prosthetic heart valve: Secondary | ICD-10-CM | POA: Diagnosis present

## 2023-01-17 LAB — ECHOCARDIOGRAM COMPLETE
AV Mean grad: 13.5 mmHg
AV Peak grad: 26 mmHg
Ao pk vel: 2.55 m/s
Area-P 1/2: 2.62 cm2
S' Lateral: 2.9 cm

## 2023-01-28 ENCOUNTER — Other Ambulatory Visit (INDEPENDENT_AMBULATORY_CARE_PROVIDER_SITE_OTHER): Payer: HMO

## 2023-01-28 DIAGNOSIS — E1165 Type 2 diabetes mellitus with hyperglycemia: Secondary | ICD-10-CM

## 2023-01-28 LAB — BASIC METABOLIC PANEL WITH GFR
BUN: 26 mg/dL — ABNORMAL HIGH (ref 6–23)
CO2: 26 meq/L (ref 19–32)
Calcium: 9.6 mg/dL (ref 8.4–10.5)
Chloride: 104 meq/L (ref 96–112)
Creatinine, Ser: 1.26 mg/dL — ABNORMAL HIGH (ref 0.40–1.20)
GFR: 41.43 mL/min — ABNORMAL LOW
Glucose, Bld: 152 mg/dL — ABNORMAL HIGH (ref 70–99)
Potassium: 5 meq/L (ref 3.5–5.1)
Sodium: 138 meq/L (ref 135–145)

## 2023-01-28 LAB — MICROALBUMIN / CREATININE URINE RATIO
Creatinine,U: 37.4 mg/dL
Microalb Creat Ratio: 64.6 mg/g — ABNORMAL HIGH (ref 0.0–30.0)
Microalb, Ur: 24.2 mg/dL — ABNORMAL HIGH (ref 0.0–1.9)

## 2023-01-28 LAB — HEMOGLOBIN A1C: Hgb A1c MFr Bld: 7.5 % — ABNORMAL HIGH (ref 4.6–6.5)

## 2023-01-31 ENCOUNTER — Other Ambulatory Visit: Payer: Self-pay

## 2023-01-31 ENCOUNTER — Encounter: Payer: Self-pay | Admitting: Endocrinology

## 2023-01-31 ENCOUNTER — Other Ambulatory Visit (HOSPITAL_COMMUNITY): Payer: Self-pay

## 2023-01-31 ENCOUNTER — Ambulatory Visit (INDEPENDENT_AMBULATORY_CARE_PROVIDER_SITE_OTHER): Payer: HMO | Admitting: Endocrinology

## 2023-01-31 VITALS — BP 130/80 | HR 95 | Ht 60.0 in | Wt 192.0 lb

## 2023-01-31 DIAGNOSIS — Z7984 Long term (current) use of oral hypoglycemic drugs: Secondary | ICD-10-CM

## 2023-01-31 DIAGNOSIS — E1165 Type 2 diabetes mellitus with hyperglycemia: Secondary | ICD-10-CM | POA: Diagnosis not present

## 2023-01-31 DIAGNOSIS — E1129 Type 2 diabetes mellitus with other diabetic kidney complication: Secondary | ICD-10-CM

## 2023-01-31 DIAGNOSIS — I10 Essential (primary) hypertension: Secondary | ICD-10-CM

## 2023-01-31 DIAGNOSIS — R809 Proteinuria, unspecified: Secondary | ICD-10-CM | POA: Diagnosis not present

## 2023-01-31 MED ORDER — EMPAGLIFLOZIN 10 MG PO TABS
10.0000 mg | ORAL_TABLET | Freq: Every day | ORAL | 3 refills | Status: DC
  Filled 2023-01-31: qty 30, 30d supply, fill #0

## 2023-01-31 NOTE — Progress Notes (Signed)
Patient ID: Shannon Kidd, female   DOB: 06-03-1946, 77 y.o.   MRN: [id number]           Reason for Appointment: Followup    History of Present Illness:          Diagnosis: Type 2 diabetes mellitus, date of diagnosis: 2000        Past history: She used Metformin for her diabetes for several years until early 2015 and was taking 1g bid before it was stopped. She thinks her blood sugars are well controlled with this. Also at some point she was given Actos but this was stopped because of edema. Metformin was stopped in early 2015 on the suggestion of a nephrologist because of her renal dysfunction Since early 2015 she had been taking Januvia 50 mg daily When her blood sugars were not well controlled with A1c of 8% in 2015 she was started on low dose metformin to control fasting hyperglycemia  Recent history:   Oral hypoglycemic drugs the patient is taking are: Amaryl 1 mg at dinnertime, 1000 mg metformin ER at bedtime     Current management, blood sugar patterns and problems identified: Her A1c is 7.5 now, previously 7.9  She has no side effects with increasing metformin to 2 tablets at night However blood sugars are still not consistently in the morning and as high as 165 She has some readings done after dinner also and these do not appear to be more than 150 Also has a couple of low sugars mid afternoon, not always symptomatic and lowest reading 55 She is generally trying to watch her diet and weight is down 3 pounds She is doing some walking at times Renal function stable, metformin previously reduced because of higher creatinine    Side effects from medications have been: Diarrhea from high-dose or regular metformin  Glucose monitoring:  done <1 times a day         Glucometer: Verio      Blood Glucose readings by meter download   PRE-MEAL Fasting Lunch Dinner Bedtime Overall  Glucose range: 104-165  55-150    Mean/median: 127  102  120   POST-MEAL PC Breakfast PC Lunch  PC Dinner  Glucose range:   80-150  Mean/median:   101   Previously:  PRE-MEAL Fasting Lunch Dinner Bedtime Overall  Glucose range: 106-130   93-203   Mean/median:     ?   POST-MEAL PC Breakfast PC Lunch PC Dinner  Glucose range:   ?  Mean/median:       Self-care:      Meals:  2-3 meals per day.  Breakfast is a egg/toast      Dietician visit: Most recent: 8/15.               Weight history: 195-245 previously  Wt Readings from Last 3 Encounters:  01/31/23 192 lb (87.1 kg)  12/24/22 195 lb (88.5 kg)  10/30/22 191 lb (86.6 kg)    Lab Results  Component Value Date   HGBA1C 7.5 (H) 01/28/2023   HGBA1C 7.9 (H) 10/25/2022   HGBA1C 7.2 (H) 02/19/2022   Lab Results  Component Value Date   MICROALBUR 24.2 (H) 01/28/2023   LDLCALC 19 10/25/2022   CREATININE 1.26 (H) 01/28/2023    Allergies as of 01/31/2023       Reactions   Pioglitazone Swelling        Medication List        Accurate as of January 31, 2023 11:55  AM. If you have any questions, ask your nurse or doctor.          amLODipine 5 MG tablet Commonly known as: NORVASC TAKE ONE TABLET BY MOUTH EVERY MORNING   aspirin 81 MG tablet Take 81 mg by mouth daily.   cyanocobalamin 1000 MCG tablet Commonly known as: VITAMIN B12 Take 1,000 mcg by mouth daily.   empagliflozin 10 MG Tabs tablet Commonly known as: Jardiance Take 1 tablet (10 mg total) by mouth daily with breakfast. Started by: Reather Littler, MD   glimepiride 1 MG tablet Commonly known as: AMARYL TAKE ONE TABLET BY MOUTH EVERYDAY AT BEDTIME   losartan 50 MG tablet Commonly known as: COZAAR Take 1 tablet (50 mg total) by mouth daily.   metFORMIN 500 MG 24 hr tablet Commonly known as: GLUCOPHAGE-XR Take 2 tablet by mouth every evening   OneTouch Delica Lancets 33G Misc USE TO CHECK BLOOD SUGAR ONCE DAILY Dx code E11.9   OneTouch Verio IQ System w/Device Kit USE TO CHECK BLOOD SUGAR ONCE DAILY Dx code E11.9   OneTouch Verio test  strip Generic drug: glucose blood USE 1 STRIP TO CHECK GLUCOSE ONCE DAILY   pramipexole 0.25 MG tablet Commonly known as: MIRAPEX Take 0.25 mg by mouth at bedtime.   rosuvastatin 20 MG tablet Commonly known as: CRESTOR TAKE ONE TABLET BY MOUTH EVERYDAY AT BEDTIME   sertraline 50 MG tablet Commonly known as: ZOLOFT Take 1.5 tablets (75 mg total) by mouth at bedtime.   vitamin C 1000 MG tablet Take 1,000 mg by mouth daily.   Vitamin D 50 MCG (2000 UT) tablet Take 2,000 Units by mouth daily.        Allergies:  Allergies  Allergen Reactions   Pioglitazone Swelling    Past Medical History:  Diagnosis Date   Anxiety    Cervical myelopathy    s/p cervical fusion @ [facility]   Chronic diastolic congestive heart failure    CKD (chronic kidney disease), stage III    Diabetes mellitus    Glaucoma    HTN (hypertension)    Hyperlipemia    Morbid obesity    Rheumatic fever    S/P TAVR (transcatheter aortic valve replacement) 07/29/2018   26 mm Edwards Sapien 3 transcatheter heart valve placed via percutaneous right transfemoral approach    Severe aortic stenosis     Past Surgical History:  Procedure Laterality Date   APPENDECTOMY     CARDIAC CATHETERIZATION     EYE SURGERY Bilateral    cataract extraction bilateral with lens   INTRAOPERATIVE TRANSTHORACIC ECHOCARDIOGRAM  07/29/2018   Procedure: INTRAOPERATIVE TRANSTHORACIC ECHOCARDIOGRAM;  Surgeon: Tonny Bollman, MD;  Location: [facility] OR;  Service: Open Heart Surgery;;   NECK SURGERY     RIGHT/LEFT HEART CATH AND CORONARY ANGIOGRAPHY N/A 06/04/2018   Procedure: RIGHT/LEFT HEART CATH AND CORONARY ANGIOGRAPHY;  Surgeon: Tonny Bollman, MD;  Location: [facility] INVASIVE CV LAB;  Service: Cardiovascular;  Laterality: N/A;   TONSILLECTOMY     TRANSCATHETER AORTIC VALVE REPLACEMENT, TRANSFEMORAL N/A 07/29/2018   Procedure: TRANSCATHETER AORTIC VALVE REPLACEMENT, TRANSFEMORAL;  Surgeon: Tonny Bollman, MD;  Location: [facility] OR;   Service: Open Heart Surgery;  Laterality: N/A;   TUBAL LIGATION     VAGINAL HYSTERECTOMY      Family History  Problem Relation Age of Onset   Diabetes Mother    Cancer Mother        Colon   Diabetes Sister    Diabetes Paternal Grandmother  Breast cancer Maternal Grandmother 70    Social History:  reports that she has never smoked. She has never used smokeless tobacco. She reports that she does not drink alcohol and does not use drugs.    Review of Systems       Lipids: She has been treated with Crestor 20 mg by cardiologist HDL tends to be low       Lab Results  Component Value Date   CHOL 73 10/25/2022   HDL 35.20 (L) 10/25/2022   LDLCALC 19 10/25/2022   TRIG 90.0 10/25/2022   CHOLHDL 2 10/25/2022                  The blood pressure has been treated with Norvasc 5 mg daily prescribed by her cardiologist Also taking 50 mg of losartan for blood pressure control  Blood pressure readings at home: Recent range 120-130 systolic  BP Readings from Last 3 Encounters:  01/31/23 130/80  12/24/22 (!) 150/77  10/30/22 138/62        Diabetic foot exam done in 4/25 with normal findings   RENAL dysfunction: Relatively stable compared to 5/23  She also has had issues with hyperkalemia previously, better with reducing losartan   Also has had mild microalbuminuria, last microalbumin ratio 45  Lab Results  Component Value Date   K 5.0 01/28/2023     Lab Results  Component Value Date   CREATININE 1.26 (H) 01/28/2023   CREATININE 1.18 10/25/2022   CREATININE 1.53 (H) 02/19/2022    She is on vitamin D supplements which is followed by PCP   LABS:  Lab on 01/28/2023  Component Date Value Ref Range Status   Sodium 01/28/2023 138  135 - 145 mEq/L Final   Potassium 01/28/2023 5.0  3.5 - 5.1 mEq/L Final   Chloride 01/28/2023 104  96 - 112 mEq/L Final   CO2 01/28/2023 26  19 - 32 mEq/L Final   Glucose, Bld 01/28/2023 152 (H)  70 - 99 mg/dL Final   BUN 16/07/9603 26  (H)  6 - 23 mg/dL Final   Creatinine, Ser 01/28/2023 1.26 (H)  0.40 - 1.20 mg/dL Final   GFR 54/06/8118 41.43 (L)  >60.00 mL/min Final   Calculated using the CKD-EPI Creatinine Equation (2021)   Calcium 01/28/2023 9.6  8.4 - 10.5 mg/dL Final   Hgb J4N MFr Bld 01/28/2023 7.5 (H)  4.6 - 6.5 % Final   Glycemic Control Guidelines for People with Diabetes:Non Diabetic:  <6%Goal of Therapy: <7%Additional Action Suggested:  >8%    Microalb, Ur 01/28/2023 24.2 (H)  0.0 - 1.9 mg/dL Final   Creatinine,U 82/95/6213 37.4  mg/dL Final   Microalb Creat Ratio 01/28/2023 64.6 (H)  0.0 - 30.0 mg/g Final    Physical Examination:  BP 130/80 (BP Location: Left Arm, Patient Position: Sitting, Cuff Size: Large)   Pulse 95   Ht 5' (1.524 m)   Wt 192 lb (87.1 kg)   SpO2 96%   BMI 37.50 kg/m   Diabetic Foot Exam - Simple   Simple Foot Form Diabetic Foot exam was performed with the following findings: Yes   Visual Inspection No deformities, no ulcerations, no other skin breakdown bilaterally: Yes See comments: Yes Sensation Testing Intact to touch and monofilament testing bilaterally: Yes Pulse Check Posterior Tibialis and Dorsalis pulse intact bilaterally: Yes See comments: Yes Comments Some flattening of the arch of the feet, trace ankle edema Posterior tibialis pulses not felt as well     ASSESSMENT:  Diabetes type 2 with obesity  See history of present illness for discussion of current diabetes management, blood sugar patterns and problems identified  Her A1c is 7.5  She is on Amaryl and metformin 500 mg twice a day  Her blood sugars are periodically high but again checking blood sugars before breakfast only Although her A1c is slightly better her blood sugars may be low normal or low before dinner while still somewhat high fasting  RENAL dysfunction: This is stable and relatively better with reducing losartan and also potassium is not high However microalbumin appears to be going  up  HYPERTENSION: Blood pressure is controlled and slightly lower at home by her history   PLAN:   Switch Amaryl to JARDIANCE 10 mg daily in the morning Recommended that this should be the preferred treatment to help her long-term with renal dysfunction as well as potential cardiovascular benefit This will also avoid hypoglycemia Recommended reducing amlodipine to 2.5 mg while starting this Increase fluid intake Discussed action of SGLT 2 drugs on lowering glucose by decreasing kidney absorption of glucose, benefits of weight loss and lower blood pressure, possible side effects including candidiasis and dosage regimen   Continue checking blood pressure at home regularly and let us know if it is unusually high or low  Patient Instructions  No Glimeperide   Jardiance in daily  Cut amlodipine to 1/2  Check blood sugars on waking up 3 days a week  Also check blood sugars about 2 hours after meals and do this after different meals by rotation  Recommended blood sugar levels on waking up are 90-130 and about 2 hours after meal is 130-160  Please bring your blood sugar monitor to each visit, thank you    Reather Littler 01/31/2023, 11:55 AM   Note: This office note was prepared with Dragon voice recognition system technology. Any transcriptional errors that result from this process are unintentional.

## 2023-01-31 NOTE — Patient Instructions (Addendum)
No Glimeperide   Jardiance in daily  Cut amlodipine to 1/2  Check blood sugars on waking up 3 days a week  Also check blood sugars about 2 hours after meals and do this after different meals by rotation  Recommended blood sugar levels on waking up are 90-130 and about 2 hours after meal is 130-160  Please bring your blood sugar monitor to each visit, thank you

## 2023-02-04 ENCOUNTER — Encounter: Payer: Self-pay | Admitting: Podiatry

## 2023-02-04 ENCOUNTER — Ambulatory Visit (INDEPENDENT_AMBULATORY_CARE_PROVIDER_SITE_OTHER): Payer: HMO | Admitting: Podiatry

## 2023-02-04 VITALS — BP 142/58

## 2023-02-04 DIAGNOSIS — B351 Tinea unguium: Secondary | ICD-10-CM

## 2023-02-04 DIAGNOSIS — E119 Type 2 diabetes mellitus without complications: Secondary | ICD-10-CM

## 2023-02-04 DIAGNOSIS — M79609 Pain in unspecified limb: Secondary | ICD-10-CM

## 2023-02-04 NOTE — Progress Notes (Unsigned)
  Subjective:  Patient ID: Shannon Kidd, female    DOB: 24-Dec-1945,  MRN: [id number]  Shannon Kidd presents to clinic today for {jgcomplaint:23593}  Chief Complaint  Patient presents with   Nail Problem    [facility] BS-did not check today A1C-7.5 PCP-Courtney Wharton PCP VST-10/2022   New problem(s): None. {jgcomplaint:23593}  PCP is Jarrett Soho, PA-C.  Allergies  Allergen Reactions   Pioglitazone Swelling    Review of Systems: Negative except as noted in the HPI.  Objective: No changes noted in today's physical examination. Vitals:   02/04/23 0850  BP: (!) 142/58   Shannon Kidd is a pleasant 77 y.o. female {jgbodyhabitus:24098} AAO x 3.  Vascular Examination: Capillary refill time immediate b/l. Vascular status intact b/l with palpable pedal pulses. Pedal hair present b/l. No edema. No pain with calf compression b/l. Skin temperature gradient WNL b/l.   Neurological Examination: Sensation grossly intact b/l with 10 gram monofilament. Vibratory sensation intact b/l.   Dermatological Examination: Pedal skin with normal turgor, texture and tone b/l.  No open wounds. No interdigital macerations.   Toenails 1-5 b/l thick, discolored, elongated with subungual debris and pain on dorsal palpation.   No hyperkeratotic nor porokeratotic lesions present on today's visit.  Musculoskeletal Examination: Muscle strength 5/5 to all lower extremity muscle groups bilaterally. No pain, crepitus or joint limitation noted with ROM bilateral LE. Utilizes cane for ambulation assistance.  Radiographs: None  Assessment/Plan: 1. Pain due to onychomycosis of nail   2. Type 2 diabetes mellitus without complication, without long-term current use of insulin (HCC)     No orders of the defined types were placed in this encounter.   None {Jgplan:23602::"-Patient/POA to call should there be question/concern in the interim."}   Return in about 3 months (around 05/06/2023).  Freddie Breech, DPM

## 2023-02-05 DIAGNOSIS — N906 Unspecified hypertrophy of vulva: Secondary | ICD-10-CM | POA: Diagnosis not present

## 2023-02-07 DIAGNOSIS — R262 Difficulty in walking, not elsewhere classified: Secondary | ICD-10-CM | POA: Diagnosis not present

## 2023-02-11 DIAGNOSIS — R262 Difficulty in walking, not elsewhere classified: Secondary | ICD-10-CM | POA: Diagnosis not present

## 2023-02-16 ENCOUNTER — Other Ambulatory Visit (HOSPITAL_COMMUNITY): Payer: Self-pay

## 2023-02-18 ENCOUNTER — Other Ambulatory Visit (HOSPITAL_COMMUNITY): Payer: Self-pay

## 2023-02-18 ENCOUNTER — Other Ambulatory Visit: Payer: Self-pay

## 2023-02-18 DIAGNOSIS — R112 Nausea with vomiting, unspecified: Secondary | ICD-10-CM | POA: Diagnosis not present

## 2023-02-18 DIAGNOSIS — T50905A Adverse effect of unspecified drugs, medicaments and biological substances, initial encounter: Secondary | ICD-10-CM | POA: Diagnosis not present

## 2023-02-18 DIAGNOSIS — G2581 Restless legs syndrome: Secondary | ICD-10-CM | POA: Diagnosis not present

## 2023-02-18 MED ORDER — ONDANSETRON 4 MG PO TBDP
4.0000 mg | ORAL_TABLET | Freq: Three times a day (TID) | ORAL | 0 refills | Status: AC | PRN
  Filled 2023-02-18: qty 30, 10d supply, fill #0

## 2023-02-18 MED ORDER — PRAMIPEXOLE DIHYDROCHLORIDE 0.25 MG PO TABS
0.2500 mg | ORAL_TABLET | Freq: Every day | ORAL | 1 refills | Status: DC
  Filled 2023-02-18: qty 90, 90d supply, fill #0
  Filled 2023-05-20: qty 90, 90d supply, fill #1

## 2023-02-25 ENCOUNTER — Other Ambulatory Visit (HOSPITAL_COMMUNITY): Payer: Self-pay

## 2023-03-06 ENCOUNTER — Other Ambulatory Visit: Payer: Self-pay

## 2023-03-06 ENCOUNTER — Telehealth: Payer: Self-pay | Admitting: Cardiology

## 2023-03-06 DIAGNOSIS — I350 Nonrheumatic aortic (valve) stenosis: Secondary | ICD-10-CM

## 2023-03-06 MED ORDER — ROSUVASTATIN CALCIUM 20 MG PO TABS
20.0000 mg | ORAL_TABLET | Freq: Every evening | ORAL | 1 refills | Status: DC
  Filled 2023-03-06: qty 90, 90d supply, fill #0
  Filled 2023-05-19 – 2023-05-31 (×2): qty 90, 90d supply, fill #1

## 2023-03-06 NOTE — Telephone Encounter (Signed)
*  STAT* If patient is at the pharmacy, call can be transferred to refill team.   1. Which medications need to be refilled? (please list name of each medication and dose if known) rosuvastatin (CRESTOR) 20 MG tablet   2. Which pharmacy/location (including street and city if local pharmacy) is medication to be sent to? [place] [facility]   3. Do they need a 30 day or 90 day supply? 90 day   Patient is completely out of medication.

## 2023-03-06 NOTE — Telephone Encounter (Signed)
Refills has been sent to the pharmacy. 

## 2023-03-07 DIAGNOSIS — R262 Difficulty in walking, not elsewhere classified: Secondary | ICD-10-CM | POA: Diagnosis not present

## 2023-03-11 DIAGNOSIS — R262 Difficulty in walking, not elsewhere classified: Secondary | ICD-10-CM | POA: Diagnosis not present

## 2023-03-12 ENCOUNTER — Other Ambulatory Visit: Payer: Self-pay | Admitting: Cardiology

## 2023-03-12 ENCOUNTER — Other Ambulatory Visit (HOSPITAL_COMMUNITY): Payer: Self-pay

## 2023-03-13 ENCOUNTER — Other Ambulatory Visit: Payer: Self-pay | Admitting: Cardiology

## 2023-03-13 ENCOUNTER — Other Ambulatory Visit (HOSPITAL_COMMUNITY): Payer: Self-pay

## 2023-03-13 MED ORDER — AMLODIPINE BESYLATE 5 MG PO TABS
5.0000 mg | ORAL_TABLET | Freq: Every morning | ORAL | 3 refills | Status: DC
  Filled 2023-03-13: qty 90, 90d supply, fill #0
  Filled 2023-05-19 – 2023-06-07 (×2): qty 90, 90d supply, fill #1
  Filled 2023-09-08: qty 90, 90d supply, fill #2

## 2023-03-14 ENCOUNTER — Other Ambulatory Visit: Payer: Self-pay

## 2023-03-18 ENCOUNTER — Other Ambulatory Visit (HOSPITAL_COMMUNITY): Payer: Self-pay

## 2023-03-18 DIAGNOSIS — R262 Difficulty in walking, not elsewhere classified: Secondary | ICD-10-CM | POA: Diagnosis not present

## 2023-03-27 ENCOUNTER — Telehealth: Payer: Self-pay

## 2023-03-27 DIAGNOSIS — E1165 Type 2 diabetes mellitus with hyperglycemia: Secondary | ICD-10-CM

## 2023-03-27 MED ORDER — DAPAGLIFLOZIN PROPANEDIOL 5 MG PO TABS
5.0000 mg | ORAL_TABLET | Freq: Every day | ORAL | 0 refills | Status: DC
  Filled 2023-03-27 – 2023-03-29 (×3): qty 30, 30d supply, fill #0

## 2023-03-27 NOTE — Telephone Encounter (Signed)
Shannon Kidd was sent to her pharmacy and she is aware of the change

## 2023-03-27 NOTE — Telephone Encounter (Signed)
Shannon Kidd is making her sick she is throwing up can't eat and her PCP told her to just stop taking it and now her sugar is running high in the 200's in the mornings, please advise?

## 2023-03-28 ENCOUNTER — Other Ambulatory Visit (HOSPITAL_COMMUNITY): Payer: Self-pay

## 2023-03-28 ENCOUNTER — Other Ambulatory Visit: Payer: Self-pay

## 2023-03-29 ENCOUNTER — Other Ambulatory Visit: Payer: Self-pay

## 2023-03-29 ENCOUNTER — Other Ambulatory Visit (HOSPITAL_COMMUNITY): Payer: Self-pay

## 2023-04-03 ENCOUNTER — Encounter (HOSPITAL_COMMUNITY): Payer: Self-pay

## 2023-04-03 ENCOUNTER — Other Ambulatory Visit (HOSPITAL_COMMUNITY): Payer: Self-pay

## 2023-04-05 DIAGNOSIS — R262 Difficulty in walking, not elsewhere classified: Secondary | ICD-10-CM | POA: Diagnosis not present

## 2023-04-08 ENCOUNTER — Other Ambulatory Visit (HOSPITAL_COMMUNITY): Payer: Self-pay

## 2023-04-08 ENCOUNTER — Other Ambulatory Visit: Payer: Self-pay | Admitting: Endocrinology

## 2023-04-08 MED ORDER — METFORMIN HCL ER 500 MG PO TB24
1000.0000 mg | ORAL_TABLET | Freq: Every evening | ORAL | 1 refills | Status: DC
  Filled 2023-04-08: qty 180, 90d supply, fill #0
  Filled 2023-07-04: qty 180, 90d supply, fill #1

## 2023-04-09 ENCOUNTER — Other Ambulatory Visit: Payer: Self-pay

## 2023-04-25 ENCOUNTER — Other Ambulatory Visit: Payer: Self-pay | Admitting: Endocrinology

## 2023-04-25 ENCOUNTER — Other Ambulatory Visit: Payer: Self-pay

## 2023-04-25 ENCOUNTER — Encounter: Payer: Self-pay | Admitting: Pharmacist

## 2023-04-25 MED ORDER — DAPAGLIFLOZIN PROPANEDIOL 5 MG PO TABS
5.0000 mg | ORAL_TABLET | Freq: Every day | ORAL | 0 refills | Status: DC
  Filled 2023-04-25 – 2023-04-26 (×2): qty 30, 30d supply, fill #0

## 2023-04-26 ENCOUNTER — Other Ambulatory Visit: Payer: Self-pay

## 2023-04-26 ENCOUNTER — Other Ambulatory Visit (HOSPITAL_COMMUNITY): Payer: Self-pay

## 2023-04-29 ENCOUNTER — Other Ambulatory Visit: Payer: HMO

## 2023-04-30 ENCOUNTER — Other Ambulatory Visit: Payer: HMO

## 2023-04-30 DIAGNOSIS — E1165 Type 2 diabetes mellitus with hyperglycemia: Secondary | ICD-10-CM

## 2023-04-30 LAB — BASIC METABOLIC PANEL WITH GFR
BUN: 35 mg/dL — ABNORMAL HIGH (ref 6–23)
CO2: 25 meq/L (ref 19–32)
Calcium: 10.1 mg/dL (ref 8.4–10.5)
Chloride: 103 meq/L (ref 96–112)
Creatinine, Ser: 1.42 mg/dL — ABNORMAL HIGH (ref 0.40–1.20)
GFR: 35.83 mL/min — ABNORMAL LOW
Glucose, Bld: 146 mg/dL — ABNORMAL HIGH (ref 70–99)
Potassium: 4.9 meq/L (ref 3.5–5.1)
Sodium: 137 meq/L (ref 135–145)

## 2023-04-30 LAB — HEMOGLOBIN A1C: Hgb A1c MFr Bld: 8.5 % — ABNORMAL HIGH (ref 4.6–6.5)

## 2023-05-02 ENCOUNTER — Other Ambulatory Visit (HOSPITAL_COMMUNITY): Payer: Self-pay

## 2023-05-02 ENCOUNTER — Encounter: Payer: Self-pay | Admitting: Endocrinology

## 2023-05-02 ENCOUNTER — Ambulatory Visit (INDEPENDENT_AMBULATORY_CARE_PROVIDER_SITE_OTHER): Payer: HMO | Admitting: Endocrinology

## 2023-05-02 VITALS — BP 142/66 | HR 61 | Ht 60.0 in | Wt 183.0 lb

## 2023-05-02 DIAGNOSIS — I10 Essential (primary) hypertension: Secondary | ICD-10-CM

## 2023-05-02 DIAGNOSIS — E1165 Type 2 diabetes mellitus with hyperglycemia: Secondary | ICD-10-CM

## 2023-05-02 DIAGNOSIS — E1129 Type 2 diabetes mellitus with other diabetic kidney complication: Secondary | ICD-10-CM

## 2023-05-02 DIAGNOSIS — R809 Proteinuria, unspecified: Secondary | ICD-10-CM

## 2023-05-02 DIAGNOSIS — N1831 Chronic kidney disease, stage 3a: Secondary | ICD-10-CM

## 2023-05-02 MED ORDER — GLIPIZIDE ER 2.5 MG PO TB24
2.5000 mg | ORAL_TABLET | Freq: Every day | ORAL | 2 refills | Status: DC
  Filled 2023-05-02: qty 30, 30d supply, fill #0
  Filled 2023-05-27: qty 30, 30d supply, fill #1
  Filled 2023-06-23: qty 30, 30d supply, fill #2

## 2023-05-02 NOTE — Patient Instructions (Signed)
Check blood sugars on waking up 3 days a week ° °Also check blood sugars about 2 hours after meals and do this after different meals by rotation ° °Recommended blood sugar levels on waking up are 90-130 and about 2 hours after meal is 130-180 ° °Please bring your blood sugar monitor to each visit, thank you ° °

## 2023-05-02 NOTE — Progress Notes (Signed)
Patient ID: Shannon Kidd, female   DOB: 1946-02-21, 77 y.o.   MRN: [id number]           Reason for Appointment: Followup    History of Present Illness:          Diagnosis: Type 2 diabetes mellitus, date of diagnosis: 2000        Past history: She used Metformin for her diabetes for several years until early 2015 and was taking 1g bid before it was stopped. She thinks her blood sugars are well controlled with this. Also at some point she was given Actos but this was stopped because of edema. Metformin was stopped in early 2015 on the suggestion of a nephrologist because of her renal dysfunction Since early 2015 she had been taking Januvia 50 mg daily When her blood sugars were not well controlled with A1c of 8% in 2015 she was started on low dose metformin to control fasting hyperglycemia  Recent history:   Oral hypoglycemic drugs the patient is taking are: Farxiga 5 mg  1000 mg metformin ER at bedtime     Current management, blood sugar patterns and problems identified: Her A1c is 8.5, was 7.5  She was having tendency to some hypoglycemia with Amaryl on the last visit in April and was changed to Erskine However about a week after starting this she had vomiting and diarrhea and her PCP stopped the Jardiance even though she was tolerating it well She is now taking [place] 5 mg daily without side effects She appears to have overall somewhat higher blood sugars compared to the last visit including fasting with highest reading 202 This is despite not changing her diet However her renal function is slightly worse She is losing weight but she also reports significant improvement in her edema Lab glucose 146 fasting She is doing some walking   She is taking metformin 1000 mg a day, dose limited by renal function   Side effects from medications have been: Diarrhea from high-dose or regular metformin  Glucose monitoring:  done <1 times a day         Glucometer: Verio      Blood  Glucose readings by meter download   PRE-MEAL Fasting Lunch Dinner Bedtime Overall  Glucose range: 154-177 127-177 149 143   Mean/median:     161   POST-MEAL PC Breakfast PC Lunch PC Dinner  Glucose range:   203  Mean/median:      Previously:  PRE-MEAL Fasting Lunch Dinner Bedtime Overall  Glucose range: 104-165  55-150    Mean/median: 127  102  120   POST-MEAL PC Breakfast PC Lunch PC Dinner  Glucose range:   80-150  Mean/median:   101    Self-care:      Meals:  2-3 meals per day.  Breakfast is a egg/toast      Dietician visit: Most recent: 8/15.               Weight history: 195-245 previously  Wt Readings from Last 3 Encounters:  05/02/23 183 lb (83 kg)  01/31/23 192 lb (87.1 kg)  12/24/22 195 lb (88.5 kg)    Lab Results  Component Value Date   HGBA1C 8.5 (H) 04/30/2023   HGBA1C 7.5 (H) 01/28/2023   HGBA1C 7.9 (H) 10/25/2022   Lab Results  Component Value Date   MICROALBUR 24.2 (H) 01/28/2023   LDLCALC 19 10/25/2022   CREATININE 1.42 (H) 04/30/2023    Allergies as of 05/02/2023  Reactions   Pioglitazone Swelling        Medication List        Accurate as of May 02, 2023 11:02 AM. If you have any questions, ask your nurse or doctor.          STOP taking these medications    glimepiride 1 MG tablet Commonly known as: AMARYL Stopped by: Reather Littler   Jardiance 10 MG Tabs tablet Generic drug: empagliflozin Stopped by: Reather Littler       TAKE these medications    amLODipine 5 MG tablet Commonly known as: NORVASC Take 1 tablet (5 mg total) by mouth every morning.   aspirin 81 MG tablet Take 81 mg by mouth daily.   cyanocobalamin 1000 MCG tablet Commonly known as: VITAMIN B12 Take 1,000 mcg by mouth daily.   Farxiga 5 MG Tabs tablet Generic drug: dapagliflozin propanediol Take 1 tablet (5 mg) by mouth daily before breakfast.   losartan 50 MG tablet Commonly known as: COZAAR Take 1 tablet (50 mg total) by mouth daily.    metFORMIN 500 MG 24 hr tablet Commonly known as: GLUCOPHAGE-XR Take 2 tablets (1,000 mg total) by mouth every evening.   ondansetron 4 MG disintegrating tablet Commonly known as: ZOFRAN-ODT Dissolve 1 tablet (4 mg total) by mouth every 8 (eight) hours as needed.   OneTouch Delica Lancets 33G Misc USE TO CHECK BLOOD SUGAR ONCE DAILY Dx code E11.9   OneTouch Verio IQ System w/Device Kit USE TO CHECK BLOOD SUGAR ONCE DAILY Dx code E11.9   OneTouch Verio test strip Generic drug: glucose blood USE 1 STRIP TO CHECK GLUCOSE ONCE DAILY   pramipexole 0.25 MG tablet Commonly known as: MIRAPEX Take 0.25 mg by mouth at bedtime.   rosuvastatin 20 MG tablet Commonly known as: CRESTOR Take 1 tablet (20 mg total) by mouth at bedtime.   sertraline 50 MG tablet Commonly known as: ZOLOFT Take 1.5 tablets (75 mg total) by mouth at bedtime.   vitamin C 1000 MG tablet Take 1,000 mg by mouth daily.   Vitamin D 50 MCG (2000 UT) tablet Take 2,000 Units by mouth daily.        Allergies:  Allergies  Allergen Reactions   Pioglitazone Swelling    Past Medical History:  Diagnosis Date   Anxiety    Cervical myelopathy (HCC)    s/p cervical fusion @ [facility]   Chronic diastolic congestive heart failure (HCC)    CKD (chronic kidney disease), stage III (HCC)    Diabetes mellitus (HCC)    Glaucoma    HTN (hypertension)    Hyperlipemia    Morbid obesity (HCC)    Rheumatic fever    S/P TAVR (transcatheter aortic valve replacement) 07/29/2018   26 mm Edwards Sapien 3 transcatheter heart valve placed via percutaneous right transfemoral approach    Severe aortic stenosis     Past Surgical History:  Procedure Laterality Date   APPENDECTOMY     CARDIAC CATHETERIZATION     EYE SURGERY Bilateral    cataract extraction bilateral with lens   INTRAOPERATIVE TRANSTHORACIC ECHOCARDIOGRAM  07/29/2018   Procedure: INTRAOPERATIVE TRANSTHORACIC ECHOCARDIOGRAM;  Surgeon: Tonny Bollman, MD;   Location: [facility] OR;  Service: Open Heart Surgery;;   NECK SURGERY     RIGHT/LEFT HEART CATH AND CORONARY ANGIOGRAPHY N/A 06/04/2018   Procedure: RIGHT/LEFT HEART CATH AND CORONARY ANGIOGRAPHY;  Surgeon: Tonny Bollman, MD;  Location: [facility] INVASIVE CV LAB;  Service: Cardiovascular;  Laterality: N/A;   TONSILLECTOMY  TRANSCATHETER AORTIC VALVE REPLACEMENT, TRANSFEMORAL N/A 07/29/2018   Procedure: TRANSCATHETER AORTIC VALVE REPLACEMENT, TRANSFEMORAL;  Surgeon: Tonny Bollman, MD;  Location: [facility] OR;  Service: Open Heart Surgery;  Laterality: N/A;   TUBAL LIGATION     VAGINAL HYSTERECTOMY      Family History  Problem Relation Age of Onset   Diabetes Mother    Cancer Mother        Colon   Diabetes Sister    Diabetes Paternal Grandmother    Breast cancer Maternal Grandmother 90    Social History:  reports that she has never smoked. She has never used smokeless tobacco. She reports that she does not drink alcohol and does not use drugs.    Review of Systems       Lipids: She has been treated with Crestor 20 mg by cardiologist HDL tends to be low       Lab Results  Component Value Date   CHOL 73 10/25/2022   HDL 35.20 (L) 10/25/2022   LDLCALC 19 10/25/2022   TRIG 90.0 10/25/2022   CHOLHDL 2 10/25/2022                  The blood pressure has been treated with Norvasc 5 mg daily prescribed by her cardiologist Also taking 50 mg of losartan for blood pressure control  Blood pressure readings at home: Recent range 130-140 systolic  BP Readings from Last 3 Encounters:  05/02/23 (!) 142/66  02/04/23 (!) 142/58  01/31/23 130/80        Diabetic foot exam done in 4/25 with normal findings  RENAL dysfunction: Somewhat worse compared to earlier this year   Also has had mild microalbuminuria, last microalbumin ratio 45   Lab Results  Component Value Date   CREATININE 1.42 (H) 04/30/2023   CREATININE 1.26 (H) 01/28/2023   CREATININE 1.18 10/25/2022    She is on vitamin D  supplements which is followed by PCP   LABS:  Lab on 04/30/2023  Component Date Value Ref Range Status   Sodium 04/30/2023 137  135 - 145 mEq/L Final   Potassium 04/30/2023 4.9  3.5 - 5.1 mEq/L Final   Chloride 04/30/2023 103  96 - 112 mEq/L Final   CO2 04/30/2023 25  19 - 32 mEq/L Final   Glucose, Bld 04/30/2023 146 (H)  70 - 99 mg/dL Final   BUN 16/07/9603 35 (H)  6 - 23 mg/dL Final   Creatinine, Ser 04/30/2023 1.42 (H)  0.40 - 1.20 mg/dL Final   GFR 54/06/8118 35.83 (L)  >60.00 mL/min Final   Calculated using the CKD-EPI Creatinine Equation (2021)   Calcium 04/30/2023 10.1  8.4 - 10.5 mg/dL Final   Hgb J4N MFr Bld 04/30/2023 8.5 (H)  4.6 - 6.5 % Final   Glycemic Control Guidelines for People with Diabetes:Non Diabetic:  <6%Goal of Therapy: <7%Additional Action Suggested:  >8%     Physical Examination:  BP (!) 142/66   Pulse 61   Ht 5' (1.524 m)   Wt 183 lb (83 kg)   SpO2 96%   BMI 35.74 kg/m    , Edema present  ASSESSMENT:  Diabetes type 2 with obesity  See history of present illness for discussion of current diabetes management, blood sugar patterns and problems identified  Her A1c is 8.5  She is on Farxiga of 5 and metformin 500 mg twice a day  Her blood sugars are generally higher with stopping Amaryl but previously was having tendency to low sugars also  She has not responded to well with Farxiga 5 mg daily and this may be partly because of her GFR of about 36  She is generally doing well with her diet and trying to walk a little Difficult to assess weight loss because she has less edema now   RENAL dysfunction: This is relatively worse without drop in blood pressure and may be from combination of Farxiga and losartan  HYPERTENSION: Blood pressure is fairly good considering her age   PLAN:   For now she can continue 5 mg Farxiga and may consider increasing it if renal function improves Trial of GLIPIZIDE ER 2.5 mg daily She would let us know if she  started getting any blood sugars below 80  To start checking blood sugars after meals more often Consistent diet Increase walking as tolerated Follow-up in early October Continue checking blood pressure at home regularly   There are no Patient Instructions on file for this visit.   Reather Littler 05/02/2023, 11:02 AM   Note: This office note was prepared with Dragon voice recognition system technology. Any transcriptional errors that result from this process are unintentional.

## 2023-05-06 ENCOUNTER — Ambulatory Visit: Payer: HMO | Admitting: Podiatry

## 2023-05-13 ENCOUNTER — Ambulatory Visit (INDEPENDENT_AMBULATORY_CARE_PROVIDER_SITE_OTHER): Payer: HMO | Admitting: Podiatry

## 2023-05-13 DIAGNOSIS — B351 Tinea unguium: Secondary | ICD-10-CM

## 2023-05-13 DIAGNOSIS — M79609 Pain in unspecified limb: Secondary | ICD-10-CM

## 2023-05-13 NOTE — Progress Notes (Signed)
       Subjective:  Patient ID: Shannon Kidd, female    DOB: 04/23/1946,  MRN: [id number]  Shannon Kidd presents to clinic today for: No chief complaint on file. . Patient notes nails are thick, discolored, elongated and painful in shoegear when trying to ambulate.  Patient had Bright disease of the kidneys as a child.  PCP is Shannon Soho, PA-C.  Date last seen 04/30/2023.  Allergies  Allergen Reactions   Pioglitazone Swelling   Review of Systems: Negative except as noted in the HPI.  Objective:  There were no vitals filed for this visit.  Shannon Kidd is a pleasant 77 y.o. female in NAD. AAO x 3.  Vascular Examination: Capillary refill time is 3-5 seconds to toes bilateral. Palpable pedal pulses b/l LE. Digital hair present b/l. No pedal edema b/l. Skin temperature gradient WNL b/l. No varicosities b/l. No cyanosis or clubbing noted b/l.   Dermatological Examination: Pedal skin with normal turgor, texture and tone b/l. No open wounds. No interdigital macerations b/l. Toenails x10 are 3mm thick, discolored, dystrophic with subungual debris. There is pain with compression of the nail plates.  They are elongated x10     Latest Ref Rng & Units 04/30/2023   10:59 AM 01/28/2023    9:03 AM 10/25/2022    8:41 AM  Hemoglobin A1C  Hemoglobin-A1c 4.6 - 6.5 % 8.5  7.5  7.9    Assessment/Plan: 1. Pain due to onychomycosis of nail     The mycotic toenails were sharply debrided x10 with sterile nail nippers and a power debriding burr to decrease bulk/thickness and length.    Return in about 3 months (around 08/13/2023) for [facility].   Clerance Lav, DPM, FACFAS [facility]     [address] [place], [place] [address]                Office [phone]  Fax [phone]

## 2023-05-19 ENCOUNTER — Other Ambulatory Visit (HOSPITAL_COMMUNITY): Payer: Self-pay

## 2023-05-19 ENCOUNTER — Other Ambulatory Visit: Payer: Self-pay

## 2023-05-19 ENCOUNTER — Other Ambulatory Visit: Payer: Self-pay | Admitting: Endocrinology

## 2023-05-20 ENCOUNTER — Other Ambulatory Visit: Payer: Self-pay

## 2023-05-20 ENCOUNTER — Other Ambulatory Visit (HOSPITAL_COMMUNITY): Payer: Self-pay

## 2023-05-20 MED ORDER — ONETOUCH VERIO VI STRP
ORAL_STRIP | 0 refills | Status: DC
  Filled 2023-05-20: qty 50, 50d supply, fill #0

## 2023-05-20 MED ORDER — LOSARTAN POTASSIUM 50 MG PO TABS
50.0000 mg | ORAL_TABLET | Freq: Every day | ORAL | 0 refills | Status: DC
  Filled 2023-05-20: qty 90, 90d supply, fill #0

## 2023-05-27 ENCOUNTER — Other Ambulatory Visit: Payer: Self-pay

## 2023-05-27 ENCOUNTER — Other Ambulatory Visit: Payer: Self-pay | Admitting: Endocrinology

## 2023-05-27 ENCOUNTER — Other Ambulatory Visit (HOSPITAL_COMMUNITY): Payer: Self-pay

## 2023-05-27 MED ORDER — DAPAGLIFLOZIN PROPANEDIOL 5 MG PO TABS
5.0000 mg | ORAL_TABLET | Freq: Every day | ORAL | 3 refills | Status: DC
  Filled 2023-05-27: qty 30, 30d supply, fill #0
  Filled 2023-06-23: qty 30, 30d supply, fill #1
  Filled 2023-07-23: qty 30, 30d supply, fill #2

## 2023-05-29 ENCOUNTER — Other Ambulatory Visit (HOSPITAL_COMMUNITY): Payer: Self-pay

## 2023-05-29 DIAGNOSIS — G629 Polyneuropathy, unspecified: Secondary | ICD-10-CM | POA: Diagnosis not present

## 2023-05-29 DIAGNOSIS — E7800 Pure hypercholesterolemia, unspecified: Secondary | ICD-10-CM | POA: Diagnosis not present

## 2023-05-29 DIAGNOSIS — N1831 Chronic kidney disease, stage 3a: Secondary | ICD-10-CM | POA: Diagnosis not present

## 2023-05-29 DIAGNOSIS — E538 Deficiency of other specified B group vitamins: Secondary | ICD-10-CM | POA: Diagnosis not present

## 2023-05-29 DIAGNOSIS — F419 Anxiety disorder, unspecified: Secondary | ICD-10-CM | POA: Diagnosis not present

## 2023-05-29 DIAGNOSIS — I10 Essential (primary) hypertension: Secondary | ICD-10-CM | POA: Diagnosis not present

## 2023-05-29 DIAGNOSIS — E1122 Type 2 diabetes mellitus with diabetic chronic kidney disease: Secondary | ICD-10-CM | POA: Diagnosis not present

## 2023-05-29 DIAGNOSIS — I272 Pulmonary hypertension, unspecified: Secondary | ICD-10-CM | POA: Diagnosis not present

## 2023-05-29 DIAGNOSIS — E1165 Type 2 diabetes mellitus with hyperglycemia: Secondary | ICD-10-CM | POA: Diagnosis not present

## 2023-05-29 DIAGNOSIS — Z6836 Body mass index (BMI) 36.0-36.9, adult: Secondary | ICD-10-CM | POA: Diagnosis not present

## 2023-05-29 DIAGNOSIS — E1142 Type 2 diabetes mellitus with diabetic polyneuropathy: Secondary | ICD-10-CM | POA: Diagnosis not present

## 2023-05-29 DIAGNOSIS — I350 Nonrheumatic aortic (valve) stenosis: Secondary | ICD-10-CM | POA: Diagnosis not present

## 2023-05-29 MED ORDER — SERTRALINE HCL 50 MG PO TABS
75.0000 mg | ORAL_TABLET | Freq: Every day | ORAL | 2 refills | Status: DC
  Filled 2023-05-29 – 2023-08-07 (×2): qty 135, 90d supply, fill #0
  Filled 2023-12-23 – 2024-01-06 (×3): qty 135, 90d supply, fill #1
  Filled 2024-05-01: qty 135, 90d supply, fill #2

## 2023-05-30 ENCOUNTER — Other Ambulatory Visit (HOSPITAL_COMMUNITY): Payer: Self-pay

## 2023-06-01 ENCOUNTER — Other Ambulatory Visit (HOSPITAL_COMMUNITY): Payer: Self-pay

## 2023-06-05 NOTE — Progress Notes (Signed)
HPI: FU AS s/p TAVR. Patient had follow-up echocardiogram August 2019 that showed normal LV function and severe aortic stenosis with mean gradient 45 mmHg. There was moderate tricuspid regurgitation. Cardiac catheterization August 2019 showed calcified coronaries but only mild nonobstructive disease. There was severe aortic stenosis and moderate pulmonary hypertension felt secondary to elevated left heart pressures. Had TAVR October 2019. Carotid Dopplers October 2022 showed 1 to 39% bilateral stenosis. Echocardiogram April 2024 showed normal LV function, mild left ventricular hypertrophy, grade 1 diastolic dysfunction, status post TAVR with no aortic insufficiency and max velocity 2.5 m/s.  Since last seen she denies dyspnea, chest pain, palpitations or syncope.  She has chronic mild pedal edema.  She has not fallen recently.  Current Outpatient Medications  Medication Sig Dispense Refill   amLODipine (NORVASC) 5 MG tablet Take 1 tablet (5 mg total) by mouth every morning. 90 tablet 3   Ascorbic Acid (VITAMIN C) 1000 MG tablet Take 1,000 mg by mouth daily.     aspirin 81 MG tablet Take 81 mg by mouth daily.     Blood Glucose Monitoring Suppl (ONETOUCH VERIO IQ SYSTEM) w/Device KIT USE TO CHECK BLOOD SUGAR ONCE DAILY Dx code E11.9 1 kit 2   Cholecalciferol (VITAMIN D) 2000 UNITS tablet Take 2,000 Units by mouth daily.     dapagliflozin propanediol (FARXIGA) 5 MG TABS tablet Take 1 tablet (5 mg) by mouth daily before breakfast. 30 tablet 3   glipiZIDE (GLUCOTROL XL) 2.5 MG 24 hr tablet Take 1 tablet (2.5 mg total) by mouth daily with breakfast. 30 tablet 2   glucose blood (ONETOUCH VERIO) test strip Use as instructed to check blood sugar once a day. 50 each 0   losartan (COZAAR) 50 MG tablet Take 1 tablet (50 mg total) by mouth daily. 90 tablet 0   metFORMIN (GLUCOPHAGE-XR) 500 MG 24 hr tablet Take 2 tablets (1,000 mg total) by mouth every evening. 180 tablet 1   ondansetron (ZOFRAN-ODT) 4 MG  disintegrating tablet Dissolve 1 tablet (4 mg total) by mouth every 8 (eight) hours as needed. 30 tablet 0   ONETOUCH DELICA LANCETS 33G MISC USE TO CHECK BLOOD SUGAR ONCE DAILY Dx code E11.9 100 each 2   pramipexole (MIRAPEX) 0.25 MG tablet Take 0.25 mg by mouth at bedtime.      pramipexole (MIRAPEX) 0.25 MG tablet Take 1 tablet (0.25 mg total) by mouth daily. 90 tablet 1   rosuvastatin (CRESTOR) 20 MG tablet Take 1 tablet (20 mg total) by mouth at bedtime. 90 tablet 1   sertraline (ZOLOFT) 50 MG tablet Take 1.5 tablets (75 mg total) by mouth daily. 135 tablet 2   vitamin B-12 (CYANOCOBALAMIN) 1000 MCG tablet Take 1,000 mcg by mouth daily.     No current facility-administered medications for this visit.     Past Medical History:  Diagnosis Date   Anxiety    Cervical myelopathy (HCC)    s/p cervical fusion @ [facility]   Chronic diastolic congestive heart failure (HCC)    CKD (chronic kidney disease), stage III (HCC)    Diabetes mellitus (HCC)    Glaucoma    HTN (hypertension)    Hyperlipemia    Morbid obesity (HCC)    Rheumatic fever    S/P TAVR (transcatheter aortic valve replacement) 07/29/2018   26 mm Edwards Sapien 3 transcatheter heart valve placed via percutaneous right transfemoral approach    Severe aortic stenosis     Past Surgical History:  Procedure Laterality  Date   APPENDECTOMY     CARDIAC CATHETERIZATION     EYE SURGERY Bilateral    cataract extraction bilateral with lens   INTRAOPERATIVE TRANSTHORACIC ECHOCARDIOGRAM  07/29/2018   Procedure: INTRAOPERATIVE TRANSTHORACIC ECHOCARDIOGRAM;  Surgeon: Tonny Bollman, MD;  Location: [facility] OR;  Service: Open Heart Surgery;;   NECK SURGERY     RIGHT/LEFT HEART CATH AND CORONARY ANGIOGRAPHY N/A 06/04/2018   Procedure: RIGHT/LEFT HEART CATH AND CORONARY ANGIOGRAPHY;  Surgeon: Tonny Bollman, MD;  Location: [facility] INVASIVE CV LAB;  Service: Cardiovascular;  Laterality: N/A;   TONSILLECTOMY     TRANSCATHETER AORTIC VALVE  REPLACEMENT, TRANSFEMORAL N/A 07/29/2018   Procedure: TRANSCATHETER AORTIC VALVE REPLACEMENT, TRANSFEMORAL;  Surgeon: Tonny Bollman, MD;  Location: [facility] OR;  Service: Open Heart Surgery;  Laterality: N/A;   TUBAL LIGATION     VAGINAL HYSTERECTOMY      Social History   Socioeconomic History   Marital status: Widowed    Spouse name: Not on file   Number of children: 2   Years of education: Not on file   Highest education level: Not on file  Occupational History   Not on file  Tobacco Use   Smoking status: Never   Smokeless tobacco: Never  Vaping Use   Vaping status: Never Used  Substance and Sexual Activity   Alcohol use: No    Alcohol/week: 0.0 standard drinks of alcohol   Drug use: Never   Sexual activity: Not on file  Other Topics Concern   Not on file  Social History Narrative   Not on file   Social Determinants of Health   Financial Resource Strain: Low Risk  (02/04/2022)   Received from [facility], [facility]   Overall Financial Resource Strain (CARDIA)    Difficulty of Paying Living Expenses: Not hard at all  Food Insecurity: No Food Insecurity (02/20/2022)   Received from [facility], [facility]   Hunger Vital Sign    Worried About Running Out of Food in the Last Year: Never true    Ran Out of Food in the Last Year: Never true  Transportation Needs: Not on file  Physical Activity: Not on file  Stress: No Stress Concern Present (02/04/2022)   Received from [facility], [facility]   [employer] of Occupational Health - Occupational Stress Questionnaire    Feeling of Stress : Not at all  Social Connections: Unknown (02/04/2022)   Received from [facility], [facility]   Social Network    Social Network: Not on file  Intimate Partner Violence: Unknown (01/11/2022)   Received from [facility], [facility]   HITS    Physically Hurt: Not on file    Insult or Talk Down To: Not on file    Threaten Physical Harm: Not on file    Scream or  Curse: Not on file    Family History  Problem Relation Age of Onset   Diabetes Mother    Cancer Mother        Colon   Diabetes Sister    Diabetes Paternal Grandmother    Breast cancer Maternal Grandmother 75    ROS: no fevers or chills, productive cough, hemoptysis, dysphasia, odynophagia, melena, hematochezia, dysuria, hematuria, rash, seizure activity, orthopnea, PND, pedal edema, claudication. Remaining systems are negative.  Physical Exam: Well-developed well-nourished in no acute distress.  Skin is warm and dry.  HEENT is normal.  Neck is supple.  Chest is clear to auscultation with normal expansion.  Cardiovascular exam is regular rate  and rhythm.  2/6 systolic murmur left sternal border.  No diastolic murmur. Abdominal exam nontender or distended. No masses palpated. Extremities show no edema. neuro grossly intact   A/P  1 history of TAVR-continue SBE prophylaxis.  Echocardiogram in April showed normally functioning valve.  2 hypertension-patient's blood pressure is controlled.  Continue present medical regimen.  3 hyperlipidemia-continue statin.  4 history of carotid artery disease-mild on most recent Dopplers.  Olga Millers, MD

## 2023-06-07 ENCOUNTER — Other Ambulatory Visit (HOSPITAL_COMMUNITY): Payer: Self-pay

## 2023-06-17 ENCOUNTER — Ambulatory Visit (INDEPENDENT_AMBULATORY_CARE_PROVIDER_SITE_OTHER): Payer: HMO | Admitting: Cardiology

## 2023-06-17 ENCOUNTER — Encounter: Payer: Self-pay | Admitting: Cardiology

## 2023-06-17 VITALS — BP 146/64 | HR 59 | Ht 60.0 in | Wt 188.1 lb

## 2023-06-17 DIAGNOSIS — Z952 Presence of prosthetic heart valve: Secondary | ICD-10-CM | POA: Diagnosis not present

## 2023-06-17 DIAGNOSIS — I10 Essential (primary) hypertension: Secondary | ICD-10-CM | POA: Diagnosis not present

## 2023-06-17 DIAGNOSIS — E7800 Pure hypercholesterolemia, unspecified: Secondary | ICD-10-CM | POA: Diagnosis not present

## 2023-06-17 NOTE — Patient Instructions (Signed)

## 2023-06-23 ENCOUNTER — Other Ambulatory Visit (HOSPITAL_COMMUNITY): Payer: Self-pay

## 2023-07-02 DIAGNOSIS — M542 Cervicalgia: Secondary | ICD-10-CM | POA: Diagnosis not present

## 2023-07-02 DIAGNOSIS — M546 Pain in thoracic spine: Secondary | ICD-10-CM | POA: Diagnosis not present

## 2023-07-02 DIAGNOSIS — M9903 Segmental and somatic dysfunction of lumbar region: Secondary | ICD-10-CM | POA: Diagnosis not present

## 2023-07-02 DIAGNOSIS — M9902 Segmental and somatic dysfunction of thoracic region: Secondary | ICD-10-CM | POA: Diagnosis not present

## 2023-07-03 DIAGNOSIS — M9903 Segmental and somatic dysfunction of lumbar region: Secondary | ICD-10-CM | POA: Diagnosis not present

## 2023-07-03 DIAGNOSIS — M542 Cervicalgia: Secondary | ICD-10-CM | POA: Diagnosis not present

## 2023-07-03 DIAGNOSIS — M9902 Segmental and somatic dysfunction of thoracic region: Secondary | ICD-10-CM | POA: Diagnosis not present

## 2023-07-03 DIAGNOSIS — M546 Pain in thoracic spine: Secondary | ICD-10-CM | POA: Diagnosis not present

## 2023-07-04 ENCOUNTER — Other Ambulatory Visit: Payer: Self-pay

## 2023-07-16 DIAGNOSIS — H5051 Esophoria: Secondary | ICD-10-CM | POA: Diagnosis not present

## 2023-07-16 DIAGNOSIS — E119 Type 2 diabetes mellitus without complications: Secondary | ICD-10-CM | POA: Diagnosis not present

## 2023-07-16 DIAGNOSIS — H532 Diplopia: Secondary | ICD-10-CM | POA: Diagnosis not present

## 2023-07-16 DIAGNOSIS — H4923 Sixth [abducent] nerve palsy, bilateral: Secondary | ICD-10-CM | POA: Diagnosis not present

## 2023-07-16 DIAGNOSIS — Z961 Presence of intraocular lens: Secondary | ICD-10-CM | POA: Diagnosis not present

## 2023-07-23 ENCOUNTER — Other Ambulatory Visit: Payer: Self-pay

## 2023-07-24 ENCOUNTER — Other Ambulatory Visit (HOSPITAL_COMMUNITY): Payer: Self-pay

## 2023-07-31 DIAGNOSIS — M9903 Segmental and somatic dysfunction of lumbar region: Secondary | ICD-10-CM | POA: Diagnosis not present

## 2023-07-31 DIAGNOSIS — M542 Cervicalgia: Secondary | ICD-10-CM | POA: Diagnosis not present

## 2023-07-31 DIAGNOSIS — M546 Pain in thoracic spine: Secondary | ICD-10-CM | POA: Diagnosis not present

## 2023-07-31 DIAGNOSIS — M9902 Segmental and somatic dysfunction of thoracic region: Secondary | ICD-10-CM | POA: Diagnosis not present

## 2023-08-05 ENCOUNTER — Other Ambulatory Visit: Payer: Self-pay

## 2023-08-05 DIAGNOSIS — E1129 Type 2 diabetes mellitus with other diabetic kidney complication: Secondary | ICD-10-CM

## 2023-08-05 DIAGNOSIS — E875 Hyperkalemia: Secondary | ICD-10-CM

## 2023-08-05 DIAGNOSIS — E1165 Type 2 diabetes mellitus with hyperglycemia: Secondary | ICD-10-CM

## 2023-08-05 DIAGNOSIS — N1831 Chronic kidney disease, stage 3a: Secondary | ICD-10-CM

## 2023-08-06 ENCOUNTER — Ambulatory Visit: Payer: HMO | Admitting: Podiatry

## 2023-08-06 DIAGNOSIS — N906 Unspecified hypertrophy of vulva: Secondary | ICD-10-CM | POA: Diagnosis not present

## 2023-08-07 ENCOUNTER — Telehealth: Payer: Self-pay | Admitting: Endocrinology

## 2023-08-07 ENCOUNTER — Other Ambulatory Visit (INDEPENDENT_AMBULATORY_CARE_PROVIDER_SITE_OTHER): Payer: HMO

## 2023-08-07 ENCOUNTER — Other Ambulatory Visit: Payer: Self-pay

## 2023-08-07 ENCOUNTER — Other Ambulatory Visit (HOSPITAL_COMMUNITY): Payer: Self-pay

## 2023-08-07 DIAGNOSIS — E1165 Type 2 diabetes mellitus with hyperglycemia: Secondary | ICD-10-CM | POA: Diagnosis not present

## 2023-08-07 DIAGNOSIS — E875 Hyperkalemia: Secondary | ICD-10-CM | POA: Diagnosis not present

## 2023-08-07 DIAGNOSIS — N1831 Chronic kidney disease, stage 3a: Secondary | ICD-10-CM

## 2023-08-07 DIAGNOSIS — E1129 Type 2 diabetes mellitus with other diabetic kidney complication: Secondary | ICD-10-CM | POA: Diagnosis not present

## 2023-08-07 DIAGNOSIS — R809 Proteinuria, unspecified: Secondary | ICD-10-CM

## 2023-08-07 LAB — BASIC METABOLIC PANEL WITH GFR
BUN: 27 mg/dL — ABNORMAL HIGH (ref 6–23)
CO2: 26 meq/L (ref 19–32)
Calcium: 9.8 mg/dL (ref 8.4–10.5)
Chloride: 105 meq/L (ref 96–112)
Creatinine, Ser: 1.34 mg/dL — ABNORMAL HIGH (ref 0.40–1.20)
GFR: 38.34 mL/min — ABNORMAL LOW
Glucose, Bld: 173 mg/dL — ABNORMAL HIGH (ref 70–99)
Potassium: 4.8 meq/L (ref 3.5–5.1)
Sodium: 139 meq/L (ref 135–145)

## 2023-08-07 MED ORDER — GLIPIZIDE ER 2.5 MG PO TB24
2.5000 mg | ORAL_TABLET | Freq: Every day | ORAL | 2 refills | Status: DC
  Filled 2023-08-07: qty 30, 30d supply, fill #0
  Filled 2023-09-01: qty 30, 30d supply, fill #1
  Filled 2023-11-25: qty 30, 30d supply, fill #2

## 2023-08-07 NOTE — Telephone Encounter (Signed)
MEDICATION:  glipiZIDE glipiZIDE (GLUCOTROL XL) 2.5 MG 24 hr tablet  PHARMACY:  [place] [facility] (Ph: [phone])   HAS THE PATIENT CONTACTED THEIR PHARMACY?  Yes  IS THIS A 90 DAY SUPPLY : Yes  IS PATIENT OUT OF MEDICATION: No  IF NOT; HOW MUCH IS LEFT: 1 pill  LAST APPOINTMENT DATE: @7 /25/2024  NEXT APPOINTMENT DATE:@11 /03/2023  DO WE HAVE YOUR PERMISSION TO LEAVE A DETAILED MESSAGE?: Yes  OTHER COMMENTS: Patient was here for lab appointment today   **Let patient know to contact pharmacy at the end of the day to make sure medication is ready. **  ** Please notify patient to allow 48-72 hours to process**  **Encourage patient to contact the pharmacy for refills or they can request refills through [facility]**

## 2023-08-08 ENCOUNTER — Other Ambulatory Visit (HOSPITAL_COMMUNITY): Payer: Self-pay

## 2023-08-08 ENCOUNTER — Other Ambulatory Visit: Payer: HMO

## 2023-08-08 ENCOUNTER — Other Ambulatory Visit: Payer: Self-pay

## 2023-08-08 LAB — MICROALBUMIN / CREATININE URINE RATIO
Creatinine,U: 18.5 mg/dL
Microalb Creat Ratio: 41.9 mg/g — ABNORMAL HIGH (ref 0.0–30.0)
Microalb, Ur: 7.7 mg/dL — ABNORMAL HIGH (ref 0.0–1.9)

## 2023-08-10 LAB — HEMOGLOBIN A1C
Hgb A1c MFr Bld: 7.8 %{Hb} — ABNORMAL HIGH
Mean Plasma Glucose: 177 mg/dL
eAG (mmol/L): 9.8 mmol/L

## 2023-08-10 LAB — FRUCTOSAMINE: Fructosamine: 292 umol/L — ABNORMAL HIGH (ref 205–285)

## 2023-08-14 ENCOUNTER — Encounter: Payer: Self-pay | Admitting: Endocrinology

## 2023-08-14 ENCOUNTER — Other Ambulatory Visit (HOSPITAL_COMMUNITY): Payer: Self-pay

## 2023-08-14 ENCOUNTER — Ambulatory Visit (INDEPENDENT_AMBULATORY_CARE_PROVIDER_SITE_OTHER): Payer: HMO | Admitting: Endocrinology

## 2023-08-14 VITALS — BP 130/66 | HR 57 | Resp 20 | Ht 60.0 in | Wt 186.0 lb

## 2023-08-14 DIAGNOSIS — Z7984 Long term (current) use of oral hypoglycemic drugs: Secondary | ICD-10-CM

## 2023-08-14 DIAGNOSIS — E1165 Type 2 diabetes mellitus with hyperglycemia: Secondary | ICD-10-CM

## 2023-08-14 MED ORDER — DAPAGLIFLOZIN PROPANEDIOL 10 MG PO TABS
10.0000 mg | ORAL_TABLET | Freq: Every day | ORAL | 3 refills | Status: DC
  Filled 2023-08-14: qty 30, 30d supply, fill #0

## 2023-08-14 MED ORDER — METFORMIN HCL ER 500 MG PO TB24
500.0000 mg | ORAL_TABLET | Freq: Every evening | ORAL | 3 refills | Status: DC
  Filled 2023-08-14 – 2023-08-17 (×2): qty 90, 90d supply, fill #0

## 2023-08-14 NOTE — Progress Notes (Signed)
Outpatient Endocrinology Note Shannon Gwyndolyn Guilford, MD  08/14/23  Patient's Name: Shannon Kidd    DOB: [birth year]    MRN: [id number]                                                    REASON OF VISIT: Follow up of type 2 diabetes mellitus  PCP: Jarrett Soho, PA-C  HISTORY OF PRESENT ILLNESS:   Shannon Kidd is a 77 y.o. old female with past medical history listed below, is here for follow up for type 2 diabetes mellitus.  Patient was last seen by Dr. Lucianne Muss in July 2024.  Pertinent Diabetes History: Patient was diagnosed with type 2 diabetes mellitus in 2000.  Patient has uncontrolled type 2 diabetes mellitus.  Chronic Diabetes Complications : Retinopathy: no. Last ophthalmology exam was done on annually, following with ophthalmology regularly.  Nephropathy: CKD IIIb, on ACE/ARB /losartan following with nephrology. Peripheral neuropathy: no Coronary artery disease: no Stroke: yes, reports cranial nerve VI palsy.  Relevant comorbidities and cardiovascular risk factors: Obesity: yes Body mass index is 36.33 kg/m.  Hypertension: Yes  Hyperlipidemia : Yes, on statin   Current / Home Diabetic regimen includes: Farxiga 5 mg daily. Metformin extended release 500 mg 2 times a day. Glipizide extended release 2.5 mg daily.  Prior diabetic medications: Diarrhea with higher dose of metformin. Jardiance in the past.  Januvia in the past.  Pioglitazone in the past caused swelling.  Glycemic data:   Glucometer One Touch Verio flex, download from October 23 to August 14, 2023 reviewed.  Average blood sugar 132.  Lowest blood sugar 67.  Highest blood sugar 216.  Some of the blood sugar in the morning 110, 156, 216, 161.  Evening blood sugar 110.  One of the evenings she had blood sugar 67.  Hypoglycemia: Patient has minor hypoglycemic episodes. Patient has hypoglycemia awareness.  Factors modifying glucose control: 1.  Diabetic diet assessment: 3 meals a day.  2.  Staying active or  exercising: No formal exercise.  3.  Medication compliance: compliant all of the time.  Interval history  Patient reports having diarrhea with metformin.  Hemoglobin A1c recently improved to 7.8%.  She has no complaints today.  Patient is accompanied by son in the clinic today.  REVIEW OF SYSTEMS As per history of present illness.   PAST MEDICAL HISTORY: Past Medical History:  Diagnosis Date   Anxiety    Cervical myelopathy (HCC)    s/p cervical fusion @ [facility]   Chronic diastolic congestive heart failure (HCC)    CKD (chronic kidney disease), stage III (HCC)    Diabetes mellitus (HCC)    Glaucoma    HTN (hypertension)    Hyperlipemia    Morbid obesity (HCC)    Rheumatic fever    S/P TAVR (transcatheter aortic valve replacement) 07/29/2018   26 mm Edwards Sapien 3 transcatheter heart valve placed via percutaneous right transfemoral approach    Severe aortic stenosis     PAST SURGICAL HISTORY: Past Surgical History:  Procedure Laterality Date   APPENDECTOMY     CARDIAC CATHETERIZATION     EYE SURGERY Bilateral    cataract extraction bilateral with lens   INTRAOPERATIVE TRANSTHORACIC ECHOCARDIOGRAM  07/29/2018   Procedure: INTRAOPERATIVE TRANSTHORACIC ECHOCARDIOGRAM;  Surgeon: Tonny Bollman, MD;  Location: [facility] OR;  Service: Open Heart Surgery;;  NECK SURGERY     RIGHT/LEFT HEART CATH AND CORONARY ANGIOGRAPHY N/A 06/04/2018   Procedure: RIGHT/LEFT HEART CATH AND CORONARY ANGIOGRAPHY;  Surgeon: Tonny Bollman, MD;  Location: [facility] INVASIVE CV LAB;  Service: Cardiovascular;  Laterality: N/A;   TONSILLECTOMY     TRANSCATHETER AORTIC VALVE REPLACEMENT, TRANSFEMORAL N/A 07/29/2018   Procedure: TRANSCATHETER AORTIC VALVE REPLACEMENT, TRANSFEMORAL;  Surgeon: Tonny Bollman, MD;  Location: [facility] OR;  Service: Open Heart Surgery;  Laterality: N/A;   TUBAL LIGATION     VAGINAL HYSTERECTOMY      ALLERGIES: Allergies  Allergen Reactions   Jardiance [Empagliflozin] Other (See  Comments)    N/V, diarrhea, weakness   Pioglitazone Swelling    FAMILY HISTORY:  Family History  Problem Relation Age of Onset   Diabetes Mother    Cancer Mother        Colon   Diabetes Sister    Diabetes Paternal Grandmother    Breast cancer Maternal Grandmother 78    SOCIAL HISTORY: Social History   Socioeconomic History   Marital status: Widowed    Spouse name: Not on file   Number of children: 2   Years of education: Not on file   Highest education level: Not on file  Occupational History   Not on file  Tobacco Use   Smoking status: Never   Smokeless tobacco: Never  Vaping Use   Vaping status: Never Used  Substance and Sexual Activity   Alcohol use: No    Alcohol/week: 0.0 standard drinks of alcohol   Drug use: Never   Sexual activity: Not on file  Other Topics Concern   Not on file  Social History Narrative   Not on file   Social Determinants of Health   Financial Resource Strain: Low Risk  (02/04/2022)   Received from [facility], [facility]   Overall Financial Resource Strain (CARDIA)    Difficulty of Paying Living Expenses: Not hard at all  Food Insecurity: No Food Insecurity (02/20/2022)   Received from [facility], [facility]   Hunger Vital Sign    Worried About Running Out of Food in the Last Year: Never true    Ran Out of Food in the Last Year: Never true  Transportation Needs: Not on file  Physical Activity: Not on file  Stress: No Stress Concern Present (02/04/2022)   Received from [facility], [facility]   [employer] of Occupational Health - Occupational Stress Questionnaire    Feeling of Stress : Not at all  Social Connections: Unknown (02/04/2022)   Received from [facility], [facility]   Social Network    Social Network: Not on file    MEDICATIONS:  Current Outpatient Medications  Medication Sig Dispense Refill   amLODipine (NORVASC) 5 MG tablet Take 1 tablet (5 mg total) by mouth every morning. 90 tablet 3    Ascorbic Acid (VITAMIN C) 1000 MG tablet Take 1,000 mg by mouth daily.     aspirin 81 MG tablet Take 81 mg by mouth daily.     Blood Glucose Monitoring Suppl (ONETOUCH VERIO IQ SYSTEM) w/Device KIT USE TO CHECK BLOOD SUGAR ONCE DAILY Dx code E11.9 1 kit 2   Cholecalciferol (VITAMIN D) 2000 UNITS tablet Take 2,000 Units by mouth daily.     glipiZIDE (GLUCOTROL XL) 2.5 MG 24 hr tablet Take 1 tablet (2.5 mg total) by mouth daily with breakfast. 30 tablet 2   glucose blood (ONETOUCH VERIO) test strip Use as instructed to check blood sugar once  a day. 50 each 0   losartan (COZAAR) 50 MG tablet Take 1 tablet (50 mg total) by mouth daily. 90 tablet 0   ondansetron (ZOFRAN-ODT) 4 MG disintegrating tablet Dissolve 1 tablet (4 mg total) by mouth every 8 (eight) hours as needed. 30 tablet 0   ONETOUCH DELICA LANCETS 33G MISC USE TO CHECK BLOOD SUGAR ONCE DAILY Dx code E11.9 100 each 2   pramipexole (MIRAPEX) 0.25 MG tablet Take 0.25 mg by mouth at bedtime.      pramipexole (MIRAPEX) 0.25 MG tablet Take 1 tablet (0.25 mg total) by mouth daily. 90 tablet 1   rosuvastatin (CRESTOR) 20 MG tablet Take 1 tablet (20 mg total) by mouth at bedtime. 90 tablet 1   sertraline (ZOLOFT) 50 MG tablet Take 1.5 tablets (75 mg total) by mouth daily. 135 tablet 2   vitamin B-12 (CYANOCOBALAMIN) 1000 MCG tablet Take 1,000 mcg by mouth daily.     dapagliflozin propanediol (FARXIGA) 10 MG TABS tablet Take 1 tablet (10 mg total) by mouth daily before breakfast. 90 tablet 3   metFORMIN (GLUCOPHAGE-XR) 500 MG 24 hr tablet Take 1 tablet (500 mg total) by mouth every evening. 90 tablet 3   No current facility-administered medications for this visit.    PHYSICAL EXAM: Vitals:   08/14/23 1308  BP: 130/66  Pulse: (!) 57  Resp: 20  SpO2: 95%  Weight: 186 lb (84.4 kg)  Height: 5' (1.524 m)   Body mass index is 36.33 kg/m.  Wt Readings from Last 3 Encounters:  08/14/23 186 lb (84.4 kg)  06/17/23 188 lb 1.9 oz (85.3 kg)   05/02/23 183 lb (83 kg)    General: Well developed, well nourished female in no apparent distress.  HEENT: AT/[place], no external lesions.  Eyes: Conjunctiva clear and no icterus. Neck: Neck supple  Lungs: Respirations not labored Neurologic: Alert, oriented, normal speech Extremities / Skin: Dry.  Psychiatric: Does not appear depressed or anxious  Diabetic Foot Exam - Simple   No data filed    LABS Reviewed Lab Results  Component Value Date   HGBA1C 7.8 (H) 08/07/2023   HGBA1C 8.5 (H) 04/30/2023   HGBA1C 7.5 (H) 01/28/2023   Lab Results  Component Value Date   FRUCTOSAMINE 292 (H) 08/07/2023   FRUCTOSAMINE 255 07/27/2019   FRUCTOSAMINE 258 08/25/2014   Lab Results  Component Value Date   CHOL 73 10/25/2022   HDL 35.20 (L) 10/25/2022   LDLCALC 19 10/25/2022   TRIG 90.0 10/25/2022   CHOLHDL 2 10/25/2022   Lab Results  Component Value Date   MICRALBCREAT 41.9 (H) 08/07/2023   MICRALBCREAT 64.6 (H) 01/28/2023   Lab Results  Component Value Date   CREATININE 1.34 (H) 08/07/2023   Lab Results  Component Value Date   GFR 38.34 (L) 08/07/2023    ASSESSMENT / PLAN  1. Uncontrolled type 2 diabetes mellitus with hyperglycemia, without long-term current use of insulin (HCC)     Diabetes Mellitus type 2, complicated by diabetic nephropathy/CKD. - Diabetic status / severity: Uncontrolled.  Lab Results  Component Value Date   HGBA1C 7.8 (H) 08/07/2023    - Hemoglobin A1c goal : <7.5%  - Medications: See below.  I) increase Farxiga from 5 to 10 mg daily. II) decrease metformin extended release 500 mg 1 tablet 2 times a day to 1 tablet daily with breakfast due to renal insufficiency/CKD and diarrhea.  III) continue glipizide extended release 2.5 mg daily.  - Home glucose testing: Check in the  morning fasting daily and at bedtime few times a week. - Discussed/ Gave Hypoglycemia treatment plan.  # Consult : not required at this time.   # Annual urine for  microalbuminuria/ creatinine ratio, no microalbuminuria currently, continue ACE/ARB /losartan, she has CKD 3B, following with nephrology. Last  Lab Results  Component Value Date   MICRALBCREAT 41.9 (H) 08/07/2023    # Foot check nightly.  # Annual dilated diabetic eye exams.   - Diet: Make healthy diabetic food choices  2. Blood pressure  -  BP Readings from Last 1 Encounters:  08/14/23 130/66    - Control is in target.  - No change in current plans.  3. Lipid status / Hyperlipidemia - Last  Lab Results  Component Value Date   LDLCALC 19 10/25/2022   - Continue rosuvastatin 20 mg daily.  Managed by primary care provider.  Diagnoses and all orders for this visit:  Uncontrolled type 2 diabetes mellitus with hyperglycemia, without long-term current use of insulin (HCC) -     Basic metabolic panel; Future -     Hemoglobin A1c; Future  Other orders -     metFORMIN (GLUCOPHAGE-XR) 500 MG 24 hr tablet; Take 1 tablet (500 mg total) by mouth every evening. -     dapagliflozin propanediol (FARXIGA) 10 MG TABS tablet; Take 1 tablet (10 mg total) by mouth daily before breakfast.    DISPOSITION Follow up in clinic in 3 months suggested.   All questions answered and patient verbalized understanding of the plan.  Shannon Ahtziry Saathoff, MD [facility] Endocrinology [facility] [address] [place], [address] [place], [place] [address] Phone # [phone]  At least part of this note was generated using voice recognition software. Inadvertent word errors may have occurred, which were not recognized during the proofreading process.

## 2023-08-14 NOTE — Patient Instructions (Signed)
Diabetes regimen: Decrease metformin ER 500 mg 1 tab daily / take with breakfast. Continue Glipizide XR 2.5mg  daily. Increase Farxiga to 10 mg daily.

## 2023-08-15 DIAGNOSIS — Z0000 Encounter for general adult medical examination without abnormal findings: Secondary | ICD-10-CM | POA: Diagnosis not present

## 2023-08-15 DIAGNOSIS — Z1389 Encounter for screening for other disorder: Secondary | ICD-10-CM | POA: Diagnosis not present

## 2023-08-15 DIAGNOSIS — Z9181 History of falling: Secondary | ICD-10-CM | POA: Diagnosis not present

## 2023-08-15 DIAGNOSIS — D509 Iron deficiency anemia, unspecified: Secondary | ICD-10-CM | POA: Diagnosis not present

## 2023-08-15 DIAGNOSIS — Z23 Encounter for immunization: Secondary | ICD-10-CM | POA: Diagnosis not present

## 2023-08-15 DIAGNOSIS — N289 Disorder of kidney and ureter, unspecified: Secondary | ICD-10-CM | POA: Diagnosis not present

## 2023-08-15 DIAGNOSIS — Z6836 Body mass index (BMI) 36.0-36.9, adult: Secondary | ICD-10-CM | POA: Diagnosis not present

## 2023-08-17 ENCOUNTER — Other Ambulatory Visit (HOSPITAL_COMMUNITY): Payer: Self-pay

## 2023-08-18 ENCOUNTER — Other Ambulatory Visit (HOSPITAL_COMMUNITY): Payer: Self-pay

## 2023-08-19 ENCOUNTER — Other Ambulatory Visit (HOSPITAL_COMMUNITY): Payer: Self-pay

## 2023-08-19 ENCOUNTER — Other Ambulatory Visit: Payer: Self-pay

## 2023-08-19 MED ORDER — PRAMIPEXOLE DIHYDROCHLORIDE 0.25 MG PO TABS
0.2500 mg | ORAL_TABLET | Freq: Every day | ORAL | 1 refills | Status: DC
  Filled 2023-08-19: qty 90, 90d supply, fill #0
  Filled 2023-12-23 – 2024-01-06 (×3): qty 90, 90d supply, fill #1

## 2023-08-20 ENCOUNTER — Other Ambulatory Visit: Payer: Self-pay

## 2023-08-20 ENCOUNTER — Other Ambulatory Visit (HOSPITAL_COMMUNITY): Payer: Self-pay

## 2023-08-21 ENCOUNTER — Other Ambulatory Visit (HOSPITAL_COMMUNITY): Payer: Self-pay

## 2023-08-23 ENCOUNTER — Other Ambulatory Visit: Payer: Self-pay

## 2023-08-26 ENCOUNTER — Other Ambulatory Visit: Payer: Self-pay | Admitting: Cardiology

## 2023-08-26 ENCOUNTER — Other Ambulatory Visit: Payer: Self-pay

## 2023-08-26 ENCOUNTER — Other Ambulatory Visit (HOSPITAL_COMMUNITY): Payer: Self-pay

## 2023-08-26 DIAGNOSIS — I350 Nonrheumatic aortic (valve) stenosis: Secondary | ICD-10-CM

## 2023-08-26 MED ORDER — ROSUVASTATIN CALCIUM 20 MG PO TABS
20.0000 mg | ORAL_TABLET | Freq: Every evening | ORAL | 3 refills | Status: DC
  Filled 2023-08-26: qty 90, 90d supply, fill #0

## 2023-08-29 ENCOUNTER — Telehealth: Payer: Self-pay

## 2023-08-29 ENCOUNTER — Other Ambulatory Visit (HOSPITAL_COMMUNITY): Payer: Self-pay

## 2023-08-29 NOTE — Telephone Encounter (Signed)
Pharmacy Patient Advocate Encounter   Received notification from CoverMyMeds that prior authorization for Shannon Kidd is required/requested.   Insurance verification completed.   The patient is insured through [facility] ADVANTAGE/RX ADVANCE .   Per test claim: PA required; PA submitted to above mentioned insurance via CoverMyMeds Key/confirmation #/EOC [id number] Status is pending

## 2023-09-02 ENCOUNTER — Other Ambulatory Visit (HOSPITAL_COMMUNITY): Payer: Self-pay

## 2023-09-02 ENCOUNTER — Other Ambulatory Visit: Payer: Self-pay

## 2023-09-02 DIAGNOSIS — R051 Acute cough: Secondary | ICD-10-CM | POA: Diagnosis not present

## 2023-09-02 DIAGNOSIS — Z6837 Body mass index (BMI) 37.0-37.9, adult: Secondary | ICD-10-CM | POA: Diagnosis not present

## 2023-09-02 MED ORDER — AMOXICILLIN-POT CLAVULANATE 875-125 MG PO TABS
1.0000 | ORAL_TABLET | Freq: Two times a day (BID) | ORAL | 0 refills | Status: DC
  Filled 2023-09-02: qty 14, 7d supply, fill #0

## 2023-09-02 NOTE — Telephone Encounter (Signed)
Pharmacy Patient Advocate Encounter  Received notification from [facility] ADVANTAGE/RX ADVANCE that Prior Authorization for Marcelline Deist has been APPROVED from 08/30/23 to 08/29/24   PA #/Case ID/Reference #: [id number]

## 2023-09-06 ENCOUNTER — Other Ambulatory Visit (HOSPITAL_COMMUNITY): Payer: Self-pay

## 2023-09-06 ENCOUNTER — Other Ambulatory Visit: Payer: Self-pay

## 2023-09-08 ENCOUNTER — Other Ambulatory Visit (HOSPITAL_COMMUNITY): Payer: Self-pay

## 2023-09-09 ENCOUNTER — Telehealth: Payer: Self-pay

## 2023-09-09 ENCOUNTER — Other Ambulatory Visit: Payer: Self-pay

## 2023-09-09 NOTE — Telephone Encounter (Signed)
If she cannot have Farxiga, I would recommend to stay on the current dose of other diabetes medications.  If blood sugar starts to go higher than usual after not taking Shannon Kidd, call our clinic.  At that time we can adjust the dose of glipizide.

## 2023-09-09 NOTE — Telephone Encounter (Signed)
Patient called wanting to discuss Farxiga prescribed. Patient stated she could not afford the asking cost/copay.

## 2023-09-10 ENCOUNTER — Other Ambulatory Visit (HOSPITAL_COMMUNITY): Payer: Self-pay

## 2023-09-10 MED ORDER — LOSARTAN POTASSIUM 50 MG PO TABS
50.0000 mg | ORAL_TABLET | Freq: Every day | ORAL | 1 refills | Status: DC
  Filled 2023-09-10: qty 90, 90d supply, fill #0
  Filled 2023-12-23 – 2024-01-06 (×3): qty 90, 90d supply, fill #1

## 2023-09-11 ENCOUNTER — Other Ambulatory Visit: Payer: Self-pay

## 2023-09-17 ENCOUNTER — Other Ambulatory Visit (HOSPITAL_COMMUNITY): Payer: Self-pay

## 2023-09-25 DIAGNOSIS — K921 Melena: Secondary | ICD-10-CM | POA: Diagnosis not present

## 2023-09-25 DIAGNOSIS — N139 Obstructive and reflux uropathy, unspecified: Secondary | ICD-10-CM | POA: Diagnosis not present

## 2023-09-25 DIAGNOSIS — Z8673 Personal history of transient ischemic attack (TIA), and cerebral infarction without residual deficits: Secondary | ICD-10-CM | POA: Diagnosis not present

## 2023-09-25 DIAGNOSIS — J45909 Unspecified asthma, uncomplicated: Secondary | ICD-10-CM | POA: Diagnosis not present

## 2023-09-25 DIAGNOSIS — R55 Syncope and collapse: Secondary | ICD-10-CM | POA: Diagnosis not present

## 2023-09-25 DIAGNOSIS — K922 Gastrointestinal hemorrhage, unspecified: Secondary | ICD-10-CM | POA: Diagnosis not present

## 2023-09-25 DIAGNOSIS — N9089 Other specified noninflammatory disorders of vulva and perineum: Secondary | ICD-10-CM | POA: Diagnosis not present

## 2023-09-25 DIAGNOSIS — N183 Chronic kidney disease, stage 3 unspecified: Secondary | ICD-10-CM | POA: Diagnosis not present

## 2023-09-25 DIAGNOSIS — K640 First degree hemorrhoids: Secondary | ICD-10-CM | POA: Diagnosis not present

## 2023-09-25 DIAGNOSIS — F419 Anxiety disorder, unspecified: Secondary | ICD-10-CM | POA: Diagnosis not present

## 2023-09-25 DIAGNOSIS — R1310 Dysphagia, unspecified: Secondary | ICD-10-CM | POA: Diagnosis not present

## 2023-09-25 DIAGNOSIS — R1111 Vomiting without nausea: Secondary | ICD-10-CM | POA: Diagnosis not present

## 2023-09-25 DIAGNOSIS — K644 Residual hemorrhoidal skin tags: Secondary | ICD-10-CM | POA: Diagnosis not present

## 2023-09-25 DIAGNOSIS — K279 Peptic ulcer, site unspecified, unspecified as acute or chronic, without hemorrhage or perforation: Secondary | ICD-10-CM | POA: Diagnosis not present

## 2023-09-25 DIAGNOSIS — E872 Acidosis, unspecified: Secondary | ICD-10-CM | POA: Diagnosis not present

## 2023-09-25 DIAGNOSIS — Z791 Long term (current) use of non-steroidal anti-inflammatories (NSAID): Secondary | ICD-10-CM | POA: Diagnosis not present

## 2023-09-25 DIAGNOSIS — Z79899 Other long term (current) drug therapy: Secondary | ICD-10-CM | POA: Diagnosis not present

## 2023-09-25 DIAGNOSIS — N3001 Acute cystitis with hematuria: Secondary | ICD-10-CM | POA: Diagnosis not present

## 2023-09-25 DIAGNOSIS — Z888 Allergy status to other drugs, medicaments and biological substances status: Secondary | ICD-10-CM | POA: Diagnosis not present

## 2023-09-25 DIAGNOSIS — K8020 Calculus of gallbladder without cholecystitis without obstruction: Secondary | ICD-10-CM | POA: Diagnosis not present

## 2023-09-25 DIAGNOSIS — I10 Essential (primary) hypertension: Secondary | ICD-10-CM | POA: Diagnosis not present

## 2023-09-25 DIAGNOSIS — Z466 Encounter for fitting and adjustment of urinary device: Secondary | ICD-10-CM | POA: Diagnosis not present

## 2023-09-25 DIAGNOSIS — G4733 Obstructive sleep apnea (adult) (pediatric): Secondary | ICD-10-CM | POA: Diagnosis not present

## 2023-09-25 DIAGNOSIS — E785 Hyperlipidemia, unspecified: Secondary | ICD-10-CM | POA: Diagnosis not present

## 2023-09-25 DIAGNOSIS — Z952 Presence of prosthetic heart valve: Secondary | ICD-10-CM | POA: Diagnosis not present

## 2023-09-25 DIAGNOSIS — I5032 Chronic diastolic (congestive) heart failure: Secondary | ICD-10-CM | POA: Diagnosis not present

## 2023-09-25 DIAGNOSIS — R0602 Shortness of breath: Secondary | ICD-10-CM | POA: Diagnosis not present

## 2023-09-25 DIAGNOSIS — R110 Nausea: Secondary | ICD-10-CM | POA: Diagnosis not present

## 2023-09-25 DIAGNOSIS — N170 Acute kidney failure with tubular necrosis: Secondary | ICD-10-CM | POA: Diagnosis not present

## 2023-09-25 DIAGNOSIS — K269 Duodenal ulcer, unspecified as acute or chronic, without hemorrhage or perforation: Secondary | ICD-10-CM | POA: Diagnosis not present

## 2023-09-25 DIAGNOSIS — R531 Weakness: Secondary | ICD-10-CM | POA: Diagnosis not present

## 2023-09-25 DIAGNOSIS — N201 Calculus of ureter: Secondary | ICD-10-CM | POA: Diagnosis not present

## 2023-09-25 DIAGNOSIS — A4189 Other specified sepsis: Secondary | ICD-10-CM | POA: Diagnosis not present

## 2023-09-25 DIAGNOSIS — A4159 Other Gram-negative sepsis: Secondary | ICD-10-CM | POA: Diagnosis not present

## 2023-09-25 DIAGNOSIS — N1832 Chronic kidney disease, stage 3b: Secondary | ICD-10-CM | POA: Diagnosis not present

## 2023-09-25 DIAGNOSIS — E871 Hypo-osmolality and hyponatremia: Secondary | ICD-10-CM | POA: Diagnosis not present

## 2023-09-25 DIAGNOSIS — K449 Diaphragmatic hernia without obstruction or gangrene: Secondary | ICD-10-CM | POA: Diagnosis not present

## 2023-09-25 DIAGNOSIS — I350 Nonrheumatic aortic (valve) stenosis: Secondary | ICD-10-CM | POA: Diagnosis not present

## 2023-09-25 DIAGNOSIS — R7881 Bacteremia: Secondary | ICD-10-CM | POA: Diagnosis not present

## 2023-09-25 DIAGNOSIS — N179 Acute kidney failure, unspecified: Secondary | ICD-10-CM | POA: Diagnosis not present

## 2023-09-25 DIAGNOSIS — Z7409 Other reduced mobility: Secondary | ICD-10-CM | POA: Diagnosis not present

## 2023-09-25 DIAGNOSIS — R112 Nausea with vomiting, unspecified: Secondary | ICD-10-CM | POA: Diagnosis not present

## 2023-09-25 DIAGNOSIS — K6389 Other specified diseases of intestine: Secondary | ICD-10-CM | POA: Diagnosis not present

## 2023-09-25 DIAGNOSIS — F32A Depression, unspecified: Secondary | ICD-10-CM | POA: Diagnosis not present

## 2023-09-25 DIAGNOSIS — D62 Acute posthemorrhagic anemia: Secondary | ICD-10-CM | POA: Diagnosis not present

## 2023-09-25 DIAGNOSIS — E1122 Type 2 diabetes mellitus with diabetic chronic kidney disease: Secondary | ICD-10-CM | POA: Diagnosis not present

## 2023-09-25 DIAGNOSIS — Z7984 Long term (current) use of oral hypoglycemic drugs: Secondary | ICD-10-CM | POA: Diagnosis not present

## 2023-09-25 DIAGNOSIS — Z960 Presence of urogenital implants: Secondary | ICD-10-CM | POA: Diagnosis not present

## 2023-09-25 DIAGNOSIS — I480 Paroxysmal atrial fibrillation: Secondary | ICD-10-CM | POA: Diagnosis not present

## 2023-09-25 DIAGNOSIS — K264 Chronic or unspecified duodenal ulcer with hemorrhage: Secondary | ICD-10-CM | POA: Diagnosis not present

## 2023-09-25 DIAGNOSIS — R197 Diarrhea, unspecified: Secondary | ICD-10-CM | POA: Diagnosis not present

## 2023-09-25 DIAGNOSIS — E870 Hyperosmolality and hypernatremia: Secondary | ICD-10-CM | POA: Diagnosis not present

## 2023-09-25 DIAGNOSIS — N136 Pyonephrosis: Secondary | ICD-10-CM | POA: Diagnosis not present

## 2023-09-25 DIAGNOSIS — R9431 Abnormal electrocardiogram [ECG] [EKG]: Secondary | ICD-10-CM | POA: Diagnosis not present

## 2023-09-25 DIAGNOSIS — T18128A Food in esophagus causing other injury, initial encounter: Secondary | ICD-10-CM | POA: Diagnosis not present

## 2023-09-25 DIAGNOSIS — Z48816 Encounter for surgical aftercare following surgery on the genitourinary system: Secondary | ICD-10-CM | POA: Diagnosis not present

## 2023-09-25 DIAGNOSIS — Z7982 Long term (current) use of aspirin: Secondary | ICD-10-CM | POA: Diagnosis not present

## 2023-09-25 DIAGNOSIS — J9811 Atelectasis: Secondary | ICD-10-CM | POA: Diagnosis not present

## 2023-09-25 DIAGNOSIS — B961 Klebsiella pneumoniae [K. pneumoniae] as the cause of diseases classified elsewhere: Secondary | ICD-10-CM | POA: Diagnosis not present

## 2023-09-25 DIAGNOSIS — N766 Ulceration of vulva: Secondary | ICD-10-CM | POA: Diagnosis not present

## 2023-09-25 DIAGNOSIS — K5900 Constipation, unspecified: Secondary | ICD-10-CM | POA: Diagnosis not present

## 2023-09-25 DIAGNOSIS — N132 Hydronephrosis with renal and ureteral calculous obstruction: Secondary | ICD-10-CM | POA: Diagnosis not present

## 2023-09-25 DIAGNOSIS — I509 Heart failure, unspecified: Secondary | ICD-10-CM | POA: Diagnosis not present

## 2023-09-25 DIAGNOSIS — I130 Hypertensive heart and chronic kidney disease with heart failure and stage 1 through stage 4 chronic kidney disease, or unspecified chronic kidney disease: Secondary | ICD-10-CM | POA: Diagnosis not present

## 2023-09-25 DIAGNOSIS — I4891 Unspecified atrial fibrillation: Secondary | ICD-10-CM | POA: Diagnosis not present

## 2023-09-25 DIAGNOSIS — B3781 Candidal esophagitis: Secondary | ICD-10-CM | POA: Diagnosis not present

## 2023-09-26 DIAGNOSIS — N201 Calculus of ureter: Secondary | ICD-10-CM | POA: Diagnosis not present

## 2023-09-26 DIAGNOSIS — N139 Obstructive and reflux uropathy, unspecified: Secondary | ICD-10-CM | POA: Diagnosis not present

## 2023-09-26 DIAGNOSIS — N179 Acute kidney failure, unspecified: Secondary | ICD-10-CM | POA: Diagnosis not present

## 2023-09-27 DIAGNOSIS — N132 Hydronephrosis with renal and ureteral calculous obstruction: Secondary | ICD-10-CM | POA: Diagnosis not present

## 2023-09-27 DIAGNOSIS — N201 Calculus of ureter: Secondary | ICD-10-CM | POA: Diagnosis not present

## 2023-09-27 DIAGNOSIS — Z791 Long term (current) use of non-steroidal anti-inflammatories (NSAID): Secondary | ICD-10-CM | POA: Diagnosis not present

## 2023-09-27 DIAGNOSIS — K921 Melena: Secondary | ICD-10-CM | POA: Diagnosis not present

## 2023-09-27 DIAGNOSIS — E871 Hypo-osmolality and hyponatremia: Secondary | ICD-10-CM | POA: Diagnosis not present

## 2023-09-27 DIAGNOSIS — R1310 Dysphagia, unspecified: Secondary | ICD-10-CM | POA: Diagnosis not present

## 2023-09-28 DIAGNOSIS — N201 Calculus of ureter: Secondary | ICD-10-CM | POA: Diagnosis not present

## 2023-09-29 DIAGNOSIS — I10 Essential (primary) hypertension: Secondary | ICD-10-CM | POA: Diagnosis not present

## 2023-09-29 DIAGNOSIS — I5032 Chronic diastolic (congestive) heart failure: Secondary | ICD-10-CM | POA: Diagnosis not present

## 2023-09-29 DIAGNOSIS — E871 Hypo-osmolality and hyponatremia: Secondary | ICD-10-CM | POA: Diagnosis not present

## 2023-09-29 DIAGNOSIS — E785 Hyperlipidemia, unspecified: Secondary | ICD-10-CM | POA: Diagnosis not present

## 2023-09-29 DIAGNOSIS — N1832 Chronic kidney disease, stage 3b: Secondary | ICD-10-CM | POA: Diagnosis not present

## 2023-09-29 DIAGNOSIS — Z952 Presence of prosthetic heart valve: Secondary | ICD-10-CM | POA: Diagnosis not present

## 2023-09-29 DIAGNOSIS — N179 Acute kidney failure, unspecified: Secondary | ICD-10-CM | POA: Diagnosis not present

## 2023-09-29 DIAGNOSIS — N201 Calculus of ureter: Secondary | ICD-10-CM | POA: Diagnosis not present

## 2023-09-30 DIAGNOSIS — A4159 Other Gram-negative sepsis: Secondary | ICD-10-CM | POA: Diagnosis not present

## 2023-09-30 DIAGNOSIS — N179 Acute kidney failure, unspecified: Secondary | ICD-10-CM | POA: Diagnosis not present

## 2023-09-30 DIAGNOSIS — D62 Acute posthemorrhagic anemia: Secondary | ICD-10-CM | POA: Diagnosis not present

## 2023-09-30 DIAGNOSIS — R1310 Dysphagia, unspecified: Secondary | ICD-10-CM | POA: Diagnosis not present

## 2023-09-30 DIAGNOSIS — N201 Calculus of ureter: Secondary | ICD-10-CM | POA: Diagnosis not present

## 2023-09-30 DIAGNOSIS — B3781 Candidal esophagitis: Secondary | ICD-10-CM | POA: Diagnosis not present

## 2023-09-30 DIAGNOSIS — K269 Duodenal ulcer, unspecified as acute or chronic, without hemorrhage or perforation: Secondary | ICD-10-CM | POA: Diagnosis not present

## 2023-09-30 DIAGNOSIS — K449 Diaphragmatic hernia without obstruction or gangrene: Secondary | ICD-10-CM | POA: Diagnosis not present

## 2023-09-30 DIAGNOSIS — T18128A Food in esophagus causing other injury, initial encounter: Secondary | ICD-10-CM | POA: Diagnosis not present

## 2023-09-30 DIAGNOSIS — K921 Melena: Secondary | ICD-10-CM | POA: Diagnosis not present

## 2023-10-01 DIAGNOSIS — T18128A Food in esophagus causing other injury, initial encounter: Secondary | ICD-10-CM | POA: Diagnosis not present

## 2023-10-01 DIAGNOSIS — N201 Calculus of ureter: Secondary | ICD-10-CM | POA: Diagnosis not present

## 2023-10-01 DIAGNOSIS — K921 Melena: Secondary | ICD-10-CM | POA: Diagnosis not present

## 2023-10-01 DIAGNOSIS — R1310 Dysphagia, unspecified: Secondary | ICD-10-CM | POA: Diagnosis not present

## 2023-10-01 DIAGNOSIS — B3781 Candidal esophagitis: Secondary | ICD-10-CM | POA: Diagnosis not present

## 2023-10-01 DIAGNOSIS — K269 Duodenal ulcer, unspecified as acute or chronic, without hemorrhage or perforation: Secondary | ICD-10-CM | POA: Diagnosis not present

## 2023-10-02 DIAGNOSIS — N201 Calculus of ureter: Secondary | ICD-10-CM | POA: Diagnosis not present

## 2023-10-03 DIAGNOSIS — E785 Hyperlipidemia, unspecified: Secondary | ICD-10-CM | POA: Diagnosis not present

## 2023-10-03 DIAGNOSIS — G4733 Obstructive sleep apnea (adult) (pediatric): Secondary | ICD-10-CM | POA: Diagnosis not present

## 2023-10-03 DIAGNOSIS — N201 Calculus of ureter: Secondary | ICD-10-CM | POA: Diagnosis not present

## 2023-10-03 DIAGNOSIS — K640 First degree hemorrhoids: Secondary | ICD-10-CM | POA: Diagnosis not present

## 2023-10-03 DIAGNOSIS — K644 Residual hemorrhoidal skin tags: Secondary | ICD-10-CM | POA: Diagnosis not present

## 2023-10-04 DIAGNOSIS — K922 Gastrointestinal hemorrhage, unspecified: Secondary | ICD-10-CM | POA: Diagnosis not present

## 2023-10-04 DIAGNOSIS — N201 Calculus of ureter: Secondary | ICD-10-CM | POA: Diagnosis not present

## 2023-10-04 DIAGNOSIS — K921 Melena: Secondary | ICD-10-CM | POA: Diagnosis not present

## 2023-10-04 DIAGNOSIS — K269 Duodenal ulcer, unspecified as acute or chronic, without hemorrhage or perforation: Secondary | ICD-10-CM | POA: Diagnosis not present

## 2023-10-04 DIAGNOSIS — K264 Chronic or unspecified duodenal ulcer with hemorrhage: Secondary | ICD-10-CM | POA: Diagnosis not present

## 2023-10-04 DIAGNOSIS — K279 Peptic ulcer, site unspecified, unspecified as acute or chronic, without hemorrhage or perforation: Secondary | ICD-10-CM | POA: Diagnosis not present

## 2023-10-05 DIAGNOSIS — E872 Acidosis, unspecified: Secondary | ICD-10-CM | POA: Diagnosis not present

## 2023-10-05 DIAGNOSIS — N170 Acute kidney failure with tubular necrosis: Secondary | ICD-10-CM | POA: Diagnosis not present

## 2023-10-05 DIAGNOSIS — K922 Gastrointestinal hemorrhage, unspecified: Secondary | ICD-10-CM | POA: Diagnosis not present

## 2023-10-05 DIAGNOSIS — R0602 Shortness of breath: Secondary | ICD-10-CM | POA: Diagnosis not present

## 2023-10-05 DIAGNOSIS — I4891 Unspecified atrial fibrillation: Secondary | ICD-10-CM | POA: Diagnosis not present

## 2023-10-05 DIAGNOSIS — J9811 Atelectasis: Secondary | ICD-10-CM | POA: Diagnosis not present

## 2023-10-05 DIAGNOSIS — N201 Calculus of ureter: Secondary | ICD-10-CM | POA: Diagnosis not present

## 2023-10-06 DIAGNOSIS — I4891 Unspecified atrial fibrillation: Secondary | ICD-10-CM | POA: Diagnosis not present

## 2023-10-06 DIAGNOSIS — N201 Calculus of ureter: Secondary | ICD-10-CM | POA: Diagnosis not present

## 2023-10-06 DIAGNOSIS — N170 Acute kidney failure with tubular necrosis: Secondary | ICD-10-CM | POA: Diagnosis not present

## 2023-10-06 DIAGNOSIS — E872 Acidosis, unspecified: Secondary | ICD-10-CM | POA: Diagnosis not present

## 2023-10-06 DIAGNOSIS — K922 Gastrointestinal hemorrhage, unspecified: Secondary | ICD-10-CM | POA: Diagnosis not present

## 2023-10-07 DIAGNOSIS — I509 Heart failure, unspecified: Secondary | ICD-10-CM | POA: Diagnosis not present

## 2023-10-07 DIAGNOSIS — I4891 Unspecified atrial fibrillation: Secondary | ICD-10-CM | POA: Diagnosis not present

## 2023-10-07 DIAGNOSIS — N170 Acute kidney failure with tubular necrosis: Secondary | ICD-10-CM | POA: Diagnosis not present

## 2023-10-07 DIAGNOSIS — K922 Gastrointestinal hemorrhage, unspecified: Secondary | ICD-10-CM | POA: Diagnosis not present

## 2023-10-07 DIAGNOSIS — E872 Acidosis, unspecified: Secondary | ICD-10-CM | POA: Diagnosis not present

## 2023-10-07 DIAGNOSIS — N201 Calculus of ureter: Secondary | ICD-10-CM | POA: Diagnosis not present

## 2023-10-07 DIAGNOSIS — Z952 Presence of prosthetic heart valve: Secondary | ICD-10-CM | POA: Diagnosis not present

## 2023-10-08 ENCOUNTER — Other Ambulatory Visit (HOSPITAL_COMMUNITY): Payer: Self-pay

## 2023-10-08 DIAGNOSIS — K264 Chronic or unspecified duodenal ulcer with hemorrhage: Secondary | ICD-10-CM | POA: Diagnosis not present

## 2023-10-08 DIAGNOSIS — N201 Calculus of ureter: Secondary | ICD-10-CM | POA: Diagnosis not present

## 2023-10-08 DIAGNOSIS — R7881 Bacteremia: Secondary | ICD-10-CM | POA: Diagnosis not present

## 2023-10-08 DIAGNOSIS — I4891 Unspecified atrial fibrillation: Secondary | ICD-10-CM | POA: Diagnosis not present

## 2023-10-10 DIAGNOSIS — N170 Acute kidney failure with tubular necrosis: Secondary | ICD-10-CM | POA: Diagnosis not present

## 2023-10-10 DIAGNOSIS — R7611 Nonspecific reaction to tuberculin skin test without active tuberculosis: Secondary | ICD-10-CM | POA: Diagnosis not present

## 2023-10-10 DIAGNOSIS — E785 Hyperlipidemia, unspecified: Secondary | ICD-10-CM | POA: Diagnosis not present

## 2023-10-10 DIAGNOSIS — J45909 Unspecified asthma, uncomplicated: Secondary | ICD-10-CM | POA: Diagnosis not present

## 2023-10-10 DIAGNOSIS — F32A Depression, unspecified: Secondary | ICD-10-CM | POA: Diagnosis not present

## 2023-10-10 DIAGNOSIS — K922 Gastrointestinal hemorrhage, unspecified: Secondary | ICD-10-CM | POA: Diagnosis not present

## 2023-10-10 DIAGNOSIS — I10 Essential (primary) hypertension: Secondary | ICD-10-CM | POA: Diagnosis not present

## 2023-10-10 DIAGNOSIS — Z960 Presence of urogenital implants: Secondary | ICD-10-CM | POA: Diagnosis not present

## 2023-10-10 DIAGNOSIS — I480 Paroxysmal atrial fibrillation: Secondary | ICD-10-CM | POA: Diagnosis not present

## 2023-10-10 DIAGNOSIS — F419 Anxiety disorder, unspecified: Secondary | ICD-10-CM | POA: Diagnosis not present

## 2023-10-10 DIAGNOSIS — Z48816 Encounter for surgical aftercare following surgery on the genitourinary system: Secondary | ICD-10-CM | POA: Diagnosis not present

## 2023-10-14 ENCOUNTER — Other Ambulatory Visit (HOSPITAL_COMMUNITY): Payer: Self-pay

## 2023-11-02 DIAGNOSIS — M1990 Unspecified osteoarthritis, unspecified site: Secondary | ICD-10-CM | POA: Diagnosis not present

## 2023-11-02 DIAGNOSIS — E538 Deficiency of other specified B group vitamins: Secondary | ICD-10-CM | POA: Diagnosis not present

## 2023-11-02 DIAGNOSIS — I10 Essential (primary) hypertension: Secondary | ICD-10-CM | POA: Diagnosis not present

## 2023-11-02 DIAGNOSIS — K922 Gastrointestinal hemorrhage, unspecified: Secondary | ICD-10-CM | POA: Diagnosis not present

## 2023-11-02 DIAGNOSIS — E1151 Type 2 diabetes mellitus with diabetic peripheral angiopathy without gangrene: Secondary | ICD-10-CM | POA: Diagnosis not present

## 2023-11-02 DIAGNOSIS — N170 Acute kidney failure with tubular necrosis: Secondary | ICD-10-CM | POA: Diagnosis not present

## 2023-11-02 DIAGNOSIS — E559 Vitamin D deficiency, unspecified: Secondary | ICD-10-CM | POA: Diagnosis not present

## 2023-11-02 DIAGNOSIS — Z6829 Body mass index (BMI) 29.0-29.9, adult: Secondary | ICD-10-CM | POA: Diagnosis not present

## 2023-11-02 DIAGNOSIS — Z9181 History of falling: Secondary | ICD-10-CM | POA: Diagnosis not present

## 2023-11-02 DIAGNOSIS — Z87442 Personal history of urinary calculi: Secondary | ICD-10-CM | POA: Diagnosis not present

## 2023-11-02 DIAGNOSIS — J45909 Unspecified asthma, uncomplicated: Secondary | ICD-10-CM | POA: Diagnosis not present

## 2023-11-02 DIAGNOSIS — Z960 Presence of urogenital implants: Secondary | ICD-10-CM | POA: Diagnosis not present

## 2023-11-02 DIAGNOSIS — I2510 Atherosclerotic heart disease of native coronary artery without angina pectoris: Secondary | ICD-10-CM | POA: Diagnosis not present

## 2023-11-02 DIAGNOSIS — E782 Mixed hyperlipidemia: Secondary | ICD-10-CM | POA: Diagnosis not present

## 2023-11-02 DIAGNOSIS — Z7982 Long term (current) use of aspirin: Secondary | ICD-10-CM | POA: Diagnosis not present

## 2023-11-02 DIAGNOSIS — I480 Paroxysmal atrial fibrillation: Secondary | ICD-10-CM | POA: Diagnosis not present

## 2023-11-02 DIAGNOSIS — Z7984 Long term (current) use of oral hypoglycemic drugs: Secondary | ICD-10-CM | POA: Diagnosis not present

## 2023-11-02 DIAGNOSIS — F329 Major depressive disorder, single episode, unspecified: Secondary | ICD-10-CM | POA: Diagnosis not present

## 2023-11-02 DIAGNOSIS — M6281 Muscle weakness (generalized): Secondary | ICD-10-CM | POA: Diagnosis not present

## 2023-11-02 DIAGNOSIS — Z48816 Encounter for surgical aftercare following surgery on the genitourinary system: Secondary | ICD-10-CM | POA: Diagnosis not present

## 2023-11-04 DIAGNOSIS — D500 Iron deficiency anemia secondary to blood loss (chronic): Secondary | ICD-10-CM | POA: Diagnosis not present

## 2023-11-05 ENCOUNTER — Ambulatory Visit: Payer: HMO | Admitting: Podiatry

## 2023-11-08 ENCOUNTER — Ambulatory Visit (INDEPENDENT_AMBULATORY_CARE_PROVIDER_SITE_OTHER): Payer: HMO | Admitting: Podiatry

## 2023-11-08 DIAGNOSIS — M79609 Pain in unspecified limb: Secondary | ICD-10-CM | POA: Diagnosis not present

## 2023-11-08 DIAGNOSIS — E119 Type 2 diabetes mellitus without complications: Secondary | ICD-10-CM | POA: Diagnosis not present

## 2023-11-08 DIAGNOSIS — B351 Tinea unguium: Secondary | ICD-10-CM

## 2023-11-08 NOTE — Progress Notes (Signed)
  Subjective:  Patient ID: Shannon Kidd, female    DOB: May 14, 1946,   MRN: [id number]  Chief Complaint  Patient presents with   Nail Problem    RFC    78 y.o. female presents for concern of thickened elongated and painful nails that are difficult to trim. Requesting to have them trimmed today. Relates burning and tingling in their feet. Patient is diabetic and last A1c was  Lab Results  Component Value Date   HGBA1C 7.8 (H) 08/07/2023   .   PCP:  Jarrett Soho, PA-C    . Denies any other pedal complaints. Denies n/v/f/c.   Past Medical History:  Diagnosis Date   Anxiety    Cervical myelopathy (HCC)    s/p cervical fusion @ [facility]   Chronic diastolic congestive heart failure (HCC)    CKD (chronic kidney disease), stage III (HCC)    Diabetes mellitus (HCC)    Glaucoma    HTN (hypertension)    Hyperlipemia    Morbid obesity (HCC)    Rheumatic fever    S/P TAVR (transcatheter aortic valve replacement) 07/29/2018   26 mm Edwards Sapien 3 transcatheter heart valve placed via percutaneous right transfemoral approach    Severe aortic stenosis     Objective:  Physical Exam: Vascular: DP/PT pulses 2/4 bilateral. CFT <3 seconds. Absent hair growth on digits. Edema noted to bilateral lower extremities. Xerosis noted bilaterally.  Skin. No lacerations or abrasions bilateral feet. Nails 1-5 bilateral  are thickened discolored and elongated with subungual debris.  Musculoskeletal: MMT 5/5 bilateral lower extremities in DF, PF, Inversion and Eversion. Deceased ROM in DF of ankle joint.  Neurological: Sensation intact to light touch. Protective sensation diminished bilateral.    Assessment:   1. Pain due to onychomycosis of nail   2. Type 2 diabetes mellitus without complication, without long-term current use of insulin (HCC)      Plan:  Patient was evaluated and treated and all questions answered. -Discussed and educated patient on diabetic foot care, especially with   regards to the vascular, neurological and musculoskeletal systems.  -Stressed the importance of good glycemic control and the detriment of not  controlling glucose levels in relation to the foot. -Discussed supportive shoes at all times and checking feet regularly.  -Mechanically debrided all nails 1-5 bilateral using sterile nail nipper and filed with dremel without incident  -Answered all patient questions -Patient to return  in 3 months for at risk foot care -Patient advised to call the office if any problems or questions arise in the meantime.   Louann Sjogren, DPM

## 2023-11-11 ENCOUNTER — Other Ambulatory Visit: Payer: HMO

## 2023-11-11 ENCOUNTER — Other Ambulatory Visit (HOSPITAL_BASED_OUTPATIENT_CLINIC_OR_DEPARTMENT_OTHER): Payer: Self-pay

## 2023-11-14 ENCOUNTER — Ambulatory Visit: Payer: HMO | Admitting: Endocrinology

## 2023-11-15 ENCOUNTER — Other Ambulatory Visit (HOSPITAL_COMMUNITY): Payer: Self-pay

## 2023-11-20 ENCOUNTER — Other Ambulatory Visit (HOSPITAL_COMMUNITY): Payer: Self-pay

## 2023-11-20 MED ORDER — PANTOPRAZOLE SODIUM 40 MG PO TBEC
40.0000 mg | DELAYED_RELEASE_TABLET | Freq: Two times a day (BID) | ORAL | 0 refills | Status: DC
  Filled 2023-11-20: qty 180, 90d supply, fill #0

## 2023-11-21 ENCOUNTER — Other Ambulatory Visit: Payer: Self-pay

## 2023-11-21 ENCOUNTER — Other Ambulatory Visit (HOSPITAL_COMMUNITY): Payer: Self-pay

## 2023-11-22 ENCOUNTER — Other Ambulatory Visit (HOSPITAL_COMMUNITY): Payer: Self-pay

## 2023-11-25 ENCOUNTER — Other Ambulatory Visit: Payer: Self-pay

## 2023-12-05 ENCOUNTER — Other Ambulatory Visit: Payer: Self-pay

## 2023-12-05 DIAGNOSIS — E1165 Type 2 diabetes mellitus with hyperglycemia: Secondary | ICD-10-CM

## 2023-12-09 ENCOUNTER — Other Ambulatory Visit: Payer: HMO

## 2023-12-09 ENCOUNTER — Encounter: Payer: Self-pay | Admitting: Endocrinology

## 2023-12-10 DIAGNOSIS — N281 Cyst of kidney, acquired: Secondary | ICD-10-CM | POA: Diagnosis not present

## 2023-12-10 DIAGNOSIS — N201 Calculus of ureter: Secondary | ICD-10-CM | POA: Diagnosis not present

## 2023-12-10 LAB — HEMOGLOBIN A1C
Hgb A1c MFr Bld: 5.8 %{Hb} — ABNORMAL HIGH
Mean Plasma Glucose: 120 mg/dL
eAG (mmol/L): 6.6 mmol/L

## 2023-12-10 LAB — BASIC METABOLIC PANEL WITH GFR
BUN/Creatinine Ratio: 15 (calc) (ref 6–22)
BUN: 20 mg/dL (ref 7–25)
CO2: 32 mmol/L (ref 20–32)
Calcium: 9.2 mg/dL (ref 8.6–10.4)
Chloride: 100 mmol/L (ref 98–110)
Creat: 1.31 mg/dL — ABNORMAL HIGH (ref 0.60–1.00)
Glucose, Bld: 127 mg/dL — ABNORMAL HIGH (ref 65–99)
Potassium: 4.3 mmol/L (ref 3.5–5.3)
Sodium: 139 mmol/L (ref 135–146)

## 2023-12-12 ENCOUNTER — Other Ambulatory Visit: Payer: Self-pay | Admitting: Cardiology

## 2023-12-12 ENCOUNTER — Encounter: Payer: Self-pay | Admitting: Endocrinology

## 2023-12-12 ENCOUNTER — Other Ambulatory Visit: Payer: Self-pay

## 2023-12-12 ENCOUNTER — Ambulatory Visit (INDEPENDENT_AMBULATORY_CARE_PROVIDER_SITE_OTHER): Payer: HMO | Admitting: Endocrinology

## 2023-12-12 VITALS — BP 128/80 | HR 66 | Ht 60.0 in | Wt 171.0 lb

## 2023-12-12 DIAGNOSIS — Z7984 Long term (current) use of oral hypoglycemic drugs: Secondary | ICD-10-CM | POA: Diagnosis not present

## 2023-12-12 DIAGNOSIS — I350 Nonrheumatic aortic (valve) stenosis: Secondary | ICD-10-CM

## 2023-12-12 DIAGNOSIS — E1165 Type 2 diabetes mellitus with hyperglycemia: Secondary | ICD-10-CM

## 2023-12-12 DIAGNOSIS — N1831 Chronic kidney disease, stage 3a: Secondary | ICD-10-CM

## 2023-12-12 MED ORDER — AMLODIPINE BESYLATE 5 MG PO TABS: 5.0000 mg | ORAL_TABLET | Freq: Every morning | ORAL | 1 refills | Status: DC

## 2023-12-12 MED ORDER — ROSUVASTATIN CALCIUM 20 MG PO TABS: 20.0000 mg | ORAL_TABLET | Freq: Every evening | ORAL | 1 refills | Status: DC

## 2023-12-12 MED ORDER — METFORMIN HCL ER 500 MG PO TB24: 500.0000 mg | ORAL_TABLET | Freq: Every evening | ORAL | 3 refills | Status: DC

## 2023-12-12 MED ORDER — GLIPIZIDE ER 2.5 MG PO TB24: 2.5000 mg | ORAL_TABLET | Freq: Every day | ORAL | 2 refills | Status: DC

## 2023-12-12 NOTE — Progress Notes (Signed)
 Outpatient Endocrinology Note [place] Jaron Czarnecki, MD  12/12/23  Patient's Name: Shannon Kidd    DOB: 1946/09/26    MRN: [id number]                                                    REASON OF VISIT: Follow up of type 2 diabetes mellitus  PCP: Jarrett Soho, PA-C  HISTORY OF PRESENT ILLNESS:   Shannon Kidd is a 78 y.o. old female with past medical history listed below, is here for follow up for type 2 diabetes mellitus.  Patient was last seen by Dr. Lucianne Muss in July 2024.  Pertinent Diabetes History: Patient was previously seen by Dr. Lucianne Muss and was last time seen in July 2024.  Patient was diagnosed with type 2 diabetes mellitus in 2000.  Patient has uncontrolled type 2 diabetes mellitus.  Chronic Diabetes Complications : Retinopathy: no. Last ophthalmology exam was done on annually, following with ophthalmology regularly.  Nephropathy: CKD IIIb, on ACE/ARB /losartan following with nephrology. Peripheral neuropathy: no Coronary artery disease: no Stroke: yes, reports cranial nerve VI palsy.  Relevant comorbidities and cardiovascular risk factors: Obesity: yes Body mass index is 33.4 kg/m.  Hypertension: Yes  Hyperlipidemia : Yes, on statin   Current / Home Diabetic regimen includes:  Metformin extended release 500 mg 1 time a day. Glipizide extended release 2.5 mg daily.  Prior diabetic medications: Diarrhea with higher dose of metformin. Jardiance in the past.  Januvia in the past.  Pioglitazone in the past caused swelling. Marcelline Deist stopped due to high cost.  Glycemic data:   Glucometer One Touch Verio flex, download from February 20 to December 12, 2023 reviewed.  Average blood sugar 110.  Patient is having frequent hypoglycemia especially in the evening blood sugar 47, 62, 49.  One-time hypoglycemia in the early morning 61.  Most of the fasting blood sugar are acceptable 148, 174, 112, 116, 121.  Blood sugar in the afternoon 139, 89.    Hypoglycemia: Patient has minor  hypoglycemic episodes. Patient has hypoglycemia awareness.  Factors modifying glucose control: 1.  Diabetic diet assessment: 3 meals a day.  2.  Staying active or exercising: No formal exercise.  3.  Medication compliance: compliant all of the time.  Interval history  Recent hemoglobin A1c 5.8%, likely falsely low due to anemia.  Patient had GI bleeding due to peptic ulcer disease was hospitalized in January 2025 at [facility].  Glucometer data as reviewed above.  Patient is having frequent hypoglycemia especially in the evening.  She is no longer taking [place] due to high cost.  She has been taking metformin and glipizide.  She reports she lost about 20 pounds of weight in last few months.  No other complaints today.  REVIEW OF SYSTEMS As per history of present illness.   PAST MEDICAL HISTORY: Past Medical History:  Diagnosis Date   Anxiety    Cervical myelopathy (HCC)    s/p cervical fusion @ [facility]   Chronic diastolic congestive heart failure (HCC)    CKD (chronic kidney disease), stage III (HCC)    Diabetes mellitus (HCC)    Glaucoma    HTN (hypertension)    Hyperlipemia    Morbid obesity (HCC)    Rheumatic fever    S/P TAVR (transcatheter aortic valve replacement) 07/29/2018   26 mm Edwards Sapien  3 transcatheter heart valve placed via percutaneous right transfemoral approach    Severe aortic stenosis     PAST SURGICAL HISTORY: Past Surgical History:  Procedure Laterality Date   APPENDECTOMY     CARDIAC CATHETERIZATION     EYE SURGERY Bilateral    cataract extraction bilateral with lens   INTRAOPERATIVE TRANSTHORACIC ECHOCARDIOGRAM  07/29/2018   Procedure: INTRAOPERATIVE TRANSTHORACIC ECHOCARDIOGRAM;  Surgeon: Tonny Bollman, MD;  Location: [facility] OR;  Service: Open Heart Surgery;;   NECK SURGERY     RIGHT/LEFT HEART CATH AND CORONARY ANGIOGRAPHY N/A 06/04/2018   Procedure: RIGHT/LEFT HEART CATH AND CORONARY ANGIOGRAPHY;  Surgeon: Tonny Bollman, MD;  Location:  [facility] INVASIVE CV LAB;  Service: Cardiovascular;  Laterality: N/A;   TONSILLECTOMY     TRANSCATHETER AORTIC VALVE REPLACEMENT, TRANSFEMORAL N/A 07/29/2018   Procedure: TRANSCATHETER AORTIC VALVE REPLACEMENT, TRANSFEMORAL;  Surgeon: Tonny Bollman, MD;  Location: [facility] OR;  Service: Open Heart Surgery;  Laterality: N/A;   TUBAL LIGATION     VAGINAL HYSTERECTOMY      ALLERGIES: Allergies  Allergen Reactions   Sitagliptin Nausea And Vomiting   Jardiance [Empagliflozin] Other (See Comments)    N/V, diarrhea, weakness   Pioglitazone Swelling    FAMILY HISTORY:  Family History  Problem Relation Age of Onset   Diabetes Mother    Cancer Mother        Colon   Diabetes Sister    Diabetes Paternal Grandmother    Breast cancer Maternal Grandmother 31    SOCIAL HISTORY: Social History   Socioeconomic History   Marital status: Widowed    Spouse name: Not on file   Number of children: 2   Years of education: Not on file   Highest education level: Not on file  Occupational History   Not on file  Tobacco Use   Smoking status: Never   Smokeless tobacco: Never  Vaping Use   Vaping status: Never Used  Substance and Sexual Activity   Alcohol use: No    Alcohol/week: 0.0 standard drinks of alcohol   Drug use: Never   Sexual activity: Not on file  Other Topics Concern   Not on file  Social History Narrative   Not on file   Social Drivers of Health   Financial Resource Strain: Low Risk  (10/30/2023)   [facility]   Overall Financial Resource Strain (CARDIA)    Difficulty of Paying Living Expenses: Not very hard  Food Insecurity: No Food Insecurity (10/30/2023)   Received from [facility]   Hunger Vital Sign    Worried About Running Out of Food in the Last Year: Never true    Ran Out of Food in the Last Year: Never true  Transportation Needs: No Transportation Needs (10/30/2023)   Received from [facility] - Transportation    Lack of Transportation  (Medical): No    Lack of Transportation (Non-Medical): No  Physical Activity: Insufficiently Active (10/30/2023)   Received from [facility]   Exercise Vital Sign    Days of Exercise per Week: 2 days    Minutes of Exercise per Session: 30 min  Stress: No Stress Concern Present (10/30/2023)   Received from [facility] - Occupational Stress Questionnaire    Feeling of Stress : Only a little  Social Connections: Somewhat Isolated (10/30/2023)   Received from [facility]   Social Network    How would you rate your social network (family, work, friends)?:  Restricted participation with some degree of social isolation    MEDICATIONS:  Current Outpatient Medications  Medication Sig Dispense Refill   amLODipine (NORVASC) 5 MG tablet Take 1 tablet (5 mg total) by mouth every morning. 90 tablet 1   Ascorbic Acid (VITAMIN C) 1000 MG tablet Take 1,000 mg by mouth daily.     aspirin 81 MG tablet Take 81 mg by mouth daily.     Blood Glucose Monitoring Suppl (ONETOUCH VERIO IQ SYSTEM) w/Device KIT USE TO CHECK BLOOD SUGAR ONCE DAILY Dx code E11.9 1 kit 2   Cholecalciferol (VITAMIN D) 2000 UNITS tablet Take 2,000 Units by mouth daily.     glucose blood (ONETOUCH VERIO) test strip Use as instructed to check blood sugar once a day. 50 each 0   losartan (COZAAR) 50 MG tablet Take 1 tablet (50 mg total) by mouth daily. 90 tablet 1   ondansetron (ZOFRAN-ODT) 4 MG disintegrating tablet Dissolve 1 tablet (4 mg total) by mouth every 8 (eight) hours as needed. 30 tablet 0   ONETOUCH DELICA LANCETS 33G MISC USE TO CHECK BLOOD SUGAR ONCE DAILY Dx code E11.9 100 each 2   pantoprazole (PROTONIX) 40 MG tablet Take 1 tablet (40 mg total) by mouth 1/2 - 1 hour before a meal 2 (two) times daily. 180 tablet 0   pramipexole (MIRAPEX) 0.25 MG tablet Take 0.25 mg by mouth at bedtime.      pramipexole (MIRAPEX) 0.25 MG tablet Take 1 tablet (0.25 mg total) by mouth daily. 90  tablet 1   rosuvastatin (CRESTOR) 20 MG tablet Take 1 tablet (20 mg total) by mouth at bedtime. 90 tablet 1   sertraline (ZOLOFT) 50 MG tablet Take 1.5 tablets (75 mg total) by mouth daily. 135 tablet 2   vitamin B-12 (CYANOCOBALAMIN) 1000 MCG tablet Take 1,000 mcg by mouth daily.     amoxicillin-clavulanate (AUGMENTIN) 875-125 MG tablet Take 1 tablet by mouth every 12 (twelve) hours. (Patient not taking: Reported on 12/12/2023) 14 tablet 0   metFORMIN (GLUCOPHAGE-XR) 500 MG 24 hr tablet Take 1 tablet (500 mg total) by mouth every evening. 90 tablet 3   No current facility-administered medications for this visit.    PHYSICAL EXAM: Vitals:   12/12/23 1330  BP: 128/80  Pulse: 66  SpO2: 96%  Weight: 171 lb (77.6 kg)  Height: 5' (1.524 m)   Body mass index is 33.4 kg/m.  Wt Readings from Last 3 Encounters:  12/12/23 171 lb (77.6 kg)  08/14/23 186 lb (84.4 kg)  06/17/23 188 lb 1.9 oz (85.3 kg)    General: Well developed, well nourished female in no apparent distress.  HEENT: AT/[place], no external lesions.  Eyes: Conjunctiva clear and no icterus. Neck: Neck supple  Lungs: Respirations not labored Neurologic: Alert, oriented, normal speech Extremities / Skin: Dry.  Psychiatric: Does not appear depressed or anxious  Diabetic Foot Exam - Simple   No data filed    LABS Reviewed Lab Results  Component Value Date   HGBA1C 5.8 (H) 12/09/2023   HGBA1C 7.8 (H) 08/07/2023   HGBA1C 8.5 (H) 04/30/2023   Lab Results  Component Value Date   FRUCTOSAMINE 292 (H) 08/07/2023   FRUCTOSAMINE 255 07/27/2019   FRUCTOSAMINE 258 08/25/2014   Lab Results  Component Value Date   CHOL 73 10/25/2022   HDL 35.20 (L) 10/25/2022   LDLCALC 19 10/25/2022   TRIG 90.0 10/25/2022   CHOLHDL 2 10/25/2022   Lab Results  Component Value Date   MICRALBCREAT  41.9 (H) 08/07/2023   MICRALBCREAT 64.6 (H) 01/28/2023   Lab Results  Component Value Date   CREATININE 1.31 (H) 12/09/2023   Lab Results   Component Value Date   GFR 38.34 (L) 08/07/2023    ASSESSMENT / PLAN  1. Uncontrolled type 2 diabetes mellitus with hyperglycemia, without long-term current use of insulin (HCC)      Diabetes Mellitus type 2, complicated by diabetic nephropathy/CKD. - Diabetic status / severity: Uncontrolled, with hypoglycemia.  Lab Results  Component Value Date   HGBA1C 5.8 (H) 12/09/2023    - Hemoglobin A1c goal : <7.5%  Recent hemoglobin A1c 5.8% likely falsely low due to anemia.  She had recent GI bleeding due to peptic ulcer disease, was hospitalized at [facility] in January 2025.  - Medications: See below.  I) continue metformin extended release 500 mg 1 tablet daily. II) stop glipizide extended release 2.5 mg daily, due to hypoglycemia.  - Home glucose testing: Check in the morning fasting daily and at bedtime few times a week. - Discussed/ Gave Hypoglycemia treatment plan.  # Consult : not required at this time.   # Annual urine for microalbuminuria/ creatinine ratio, no microalbuminuria currently, continue ACE/ARB /losartan, she has CKD 3B, following with nephrology. Last  Lab Results  Component Value Date   MICRALBCREAT 41.9 (H) 08/07/2023    # Foot check nightly.  # Annual dilated diabetic eye exams.   - Diet: Make healthy diabetic food choices  2. Blood pressure  -  BP Readings from Last 1 Encounters:  12/12/23 128/80    - Control is in target.  - No change in current plans.  3. Lipid status / Hyperlipidemia - Last  Lab Results  Component Value Date   LDLCALC 19 10/25/2022   - Continue rosuvastatin 20 mg daily.  Managed by primary care provider.  Shannon Kidd was seen today for follow-up.  Diagnoses and all orders for this visit:  Uncontrolled type 2 diabetes mellitus with hyperglycemia, without long-term current use of insulin (HCC) -     BASIC METABOLIC PANEL WITH GFR -     Hemoglobin A1c    DISPOSITION Follow up in clinic in 3 months suggested.    All questions answered and patient verbalized understanding of the plan.  [place] Tonda Wiederhold, MD [facility] Endocrinology [facility] [address] [place], [address] [place], [place] [address] Phone # [phone]  At least part of this note was generated using voice recognition software. Inadvertent word errors may have occurred, which were not recognized during the proofreading process.

## 2023-12-12 NOTE — Patient Instructions (Signed)
 Diabetes regimen: Metformin ER 500 mg 1 tab daily / take with breakfast.

## 2023-12-23 ENCOUNTER — Other Ambulatory Visit: Payer: Self-pay

## 2023-12-23 ENCOUNTER — Other Ambulatory Visit (HOSPITAL_COMMUNITY): Payer: Self-pay

## 2023-12-23 ENCOUNTER — Other Ambulatory Visit: Payer: Self-pay | Admitting: Endocrinology

## 2023-12-23 ENCOUNTER — Other Ambulatory Visit: Payer: Self-pay | Admitting: Cardiology

## 2023-12-23 DIAGNOSIS — I350 Nonrheumatic aortic (valve) stenosis: Secondary | ICD-10-CM

## 2023-12-23 MED ORDER — METFORMIN HCL ER 500 MG PO TB24
500.0000 mg | ORAL_TABLET | Freq: Every evening | ORAL | 1 refills | Status: DC
  Filled 2023-12-23 – 2024-01-19 (×2): qty 90, 90d supply, fill #0
  Filled 2024-04-21: qty 90, 90d supply, fill #1

## 2023-12-26 DIAGNOSIS — N201 Calculus of ureter: Secondary | ICD-10-CM | POA: Diagnosis not present

## 2023-12-30 DIAGNOSIS — D500 Iron deficiency anemia secondary to blood loss (chronic): Secondary | ICD-10-CM | POA: Diagnosis not present

## 2023-12-30 DIAGNOSIS — K279 Peptic ulcer, site unspecified, unspecified as acute or chronic, without hemorrhage or perforation: Secondary | ICD-10-CM | POA: Diagnosis not present

## 2024-01-03 DIAGNOSIS — Z954 Presence of other heart-valve replacement: Secondary | ICD-10-CM | POA: Diagnosis not present

## 2024-01-03 DIAGNOSIS — Z90710 Acquired absence of both cervix and uterus: Secondary | ICD-10-CM | POA: Diagnosis not present

## 2024-01-03 DIAGNOSIS — R918 Other nonspecific abnormal finding of lung field: Secondary | ICD-10-CM | POA: Diagnosis not present

## 2024-01-03 DIAGNOSIS — R296 Repeated falls: Secondary | ICD-10-CM | POA: Diagnosis not present

## 2024-01-03 DIAGNOSIS — E785 Hyperlipidemia, unspecified: Secondary | ICD-10-CM | POA: Diagnosis not present

## 2024-01-03 DIAGNOSIS — F418 Other specified anxiety disorders: Secondary | ICD-10-CM | POA: Diagnosis not present

## 2024-01-03 DIAGNOSIS — Z79899 Other long term (current) drug therapy: Secondary | ICD-10-CM | POA: Diagnosis not present

## 2024-01-03 DIAGNOSIS — M25551 Pain in right hip: Secondary | ICD-10-CM | POA: Diagnosis not present

## 2024-01-03 DIAGNOSIS — S20211A Contusion of right front wall of thorax, initial encounter: Secondary | ICD-10-CM | POA: Diagnosis not present

## 2024-01-03 DIAGNOSIS — Z7984 Long term (current) use of oral hypoglycemic drugs: Secondary | ICD-10-CM | POA: Diagnosis not present

## 2024-01-03 DIAGNOSIS — K8020 Calculus of gallbladder without cholecystitis without obstruction: Secondary | ICD-10-CM | POA: Diagnosis not present

## 2024-01-03 DIAGNOSIS — I10 Essential (primary) hypertension: Secondary | ICD-10-CM | POA: Diagnosis not present

## 2024-01-03 DIAGNOSIS — R059 Cough, unspecified: Secondary | ICD-10-CM | POA: Diagnosis not present

## 2024-01-03 DIAGNOSIS — S0990XA Unspecified injury of head, initial encounter: Secondary | ICD-10-CM | POA: Diagnosis not present

## 2024-01-03 DIAGNOSIS — Z7722 Contact with and (suspected) exposure to environmental tobacco smoke (acute) (chronic): Secondary | ICD-10-CM | POA: Diagnosis not present

## 2024-01-03 DIAGNOSIS — R000 Tachycardia, unspecified: Secondary | ICD-10-CM | POA: Diagnosis not present

## 2024-01-03 DIAGNOSIS — Z87891 Personal history of nicotine dependence: Secondary | ICD-10-CM | POA: Diagnosis not present

## 2024-01-03 DIAGNOSIS — R4182 Altered mental status, unspecified: Secondary | ICD-10-CM | POA: Diagnosis not present

## 2024-01-03 DIAGNOSIS — Z888 Allergy status to other drugs, medicaments and biological substances status: Secondary | ICD-10-CM | POA: Diagnosis not present

## 2024-01-03 DIAGNOSIS — Z8673 Personal history of transient ischemic attack (TIA), and cerebral infarction without residual deficits: Secondary | ICD-10-CM | POA: Diagnosis not present

## 2024-01-03 DIAGNOSIS — R911 Solitary pulmonary nodule: Secondary | ICD-10-CM | POA: Diagnosis not present

## 2024-01-03 DIAGNOSIS — J45909 Unspecified asthma, uncomplicated: Secondary | ICD-10-CM | POA: Diagnosis not present

## 2024-01-03 DIAGNOSIS — W19XXXA Unspecified fall, initial encounter: Secondary | ICD-10-CM | POA: Diagnosis not present

## 2024-01-03 DIAGNOSIS — E119 Type 2 diabetes mellitus without complications: Secondary | ICD-10-CM | POA: Diagnosis not present

## 2024-01-03 DIAGNOSIS — M25572 Pain in left ankle and joints of left foot: Secondary | ICD-10-CM | POA: Diagnosis not present

## 2024-01-03 NOTE — ED Provider Notes (Signed)
 " [facility]  ED Provider Note  Shannon Kidd 78 y.o. female DOB: 1946-07-13 MRN: [id number] History   Chief Complaint  Patient presents with   Fall    BIB EMS from home after falling while trying to pick up a cough drop. Hit head, no LOC, no thinners. Now c/o R hip pain. Given 50 mcg fentanyl  by EMS.    Patient is a 78 year old female with history of hypertension, diabetes, hyperlipidemia, who presents to the emergency department today for evaluation of fall.  Patient was bending over to pick up a cough drop, stood back up, lost her balance and fell straight back onto the floor.  She reports she hit her head but did not lose consciousness.  She denies any headache or neck pain.  She is reporting some pain in her right lower lateral rib area, constant, rated 10 out of 10, down to 8 out of 10 after IV fentanyl  with EMS reports 10 out of 10 pain with any movement.  Not short of breath.  No nausea or vomiting.  Not anticoagulated.  No other complaints.      Past Medical History:  Diagnosis Date   Anxiety and depression    Aortic valve replaced    Asthma (*)    Diabetes mellitus  (*)    Hyperlipidemia    Hypertension    Ingrown left big toenail 06/28/2022   Rheumatic fever    Stroke  (*)     Past Surgical History:  Procedure Laterality Date   Aortic valve replacement  07/29/2018   Cataract extraction, bilateral     Cervical spine surgery     Cervical spine surgery  2011   Hysterectomy      Social History   Substance and Sexual Activity  Alcohol Use Not Currently   Social History   Tobacco Use  Smoking Status Never   Passive exposure: Past  Smokeless Tobacco Never   E-Cigarettes   Vaping Use Never User    Start Date     Cartridges/Day     Quit Date     Social History   Substance and Sexual Activity  Drug Use Never         Allergies  Allergen Reactions   Actos [Pioglitazone] Swelling   Januvia  [Sitagliptin] Nausea And Vomiting    Home Medications   AMLODIPINE  BESYLATE (NORVASC ) 5 MG TABLET    Take one tablet (5 mg dose) by mouth daily.   ASCORBIC ACID (VITAMIN C) 1000 MG TABLET    Take one tablet (1,000 mg dose) by mouth daily.   CHOLECALCIFEROL (VITAMIN D-3) 1,000 UNITS (25 MCG) TABLET    Take one tablet (1,000 Units dose) by mouth daily.   LOSARTAN  POTASSIUM (COZAAR ) 50 MG TABLET    Take one tablet (50 mg dose) by mouth daily.   METFORMIN  ER (GLUCOPHAGE -XR) 500 MG 24 HR TABLET    Take one tablet (500 mg dose) by mouth every evening.   MULTIPLE VITAMIN (MULTI-VITAMIN) TABLET    Take one tablet by mouth daily.   ONDANSETRON  (ZOFRAN -ODT) 4 MG DISINTEGRATING TABLET    Take one tablet (4 mg dose) by mouth every 8 (eight) hours as needed.   OXYBUTYNIN (DITROPAN) 5 MG TABLET    Take one tablet (5 mg dose) by mouth 3 (three) times a day as needed.   PANTOPRAZOLE  SODIUM (PROTONIX ) 40 MG TABLET    Take one tablet (40 mg dose) by mouth daily.   PRAMIPEXOLE  DIHYDROCHLORIDE (MIRAPEX )  0.25 MG TABLET    Take one tablet (0.25 mg dose) by mouth at bedtime.   ROSUVASTATIN  CALCIUM  (CRESTOR ) 20 MG TABLET    Take one tablet (20 mg dose) by mouth daily.   SERTRALINE  (ZOLOFT ) 50 MG TABLET    Take one and a half tablets (75 mg dose) by mouth daily.   TAMSULOSIN (FLOMAX) 0.4 MG CAPS    Take one capsule (0.4 mg dose) by mouth at bedtime.   VITAMIN B-12 (CYANOCOBALAMIN ) 500 MCG TABLET    Take one tablet (500 mcg dose) by mouth daily.    Primary Survey  Primary Survey  Review of Systems   Review of Systems  Respiratory:  Negative for shortness of breath.   All other systems reviewed and are negative.   Physical Exam   ED Triage Vitals [01/03/24 0153]  BP (!) 176/75  Heart Rate 51  Resp 17  SpO2 97 %  Temp 98.2 F (36.8 C)    Physical Exam  Constitutional: She appears well-developed and well-nourished. She does not appear distressed and no respiratory distress.  HENT:  Head: Normocephalic  and atraumatic.  Right Ear: Normal external ear.  Left Ear: Normal external ear.  Nose: Nose normal.  Mouth/Throat: No oropharyngeal exudate. Eyes: EOM are intact. Conjunctivae are normal. Pupils are equal, round, and reactive to light. Right eye: no drainage. Left eye: no drainage. No scleral icterus.  Neck: No JVD. No tracheal deviation.  Cardiovascular: Normal rate, regular rhythm and intact distal pulses.  No audible murmur. No friction rub and gallop.  Pulmonary/Chest: No respiratory distress. Respiratory effort normal and breath sounds normal.  There is tenderness to palpation noted to the anterolateral right lower lid ribs without any crepitus or deformity  Abdominal: Soft. There is no abdominal tenderness. Abdomen not distended. Bowel sounds are normal.  Musculoskeletal: No obvious deformity noted to extremities.     Cervical back: No rigidity.     Comments: No midline cervical thoracic or lumbosacral spine tenderness, chest wall, pelvis and extremities are all stable but there is some tenderness to palpation noted to the right anterolateral inferior ribs without any crepitus or deformity no edema.  Neurological: She is alert and oriented to person, place, and time. Cranial nerves intact II through XII. She has normal strength. Sensory intact.  Skin: Skin is warm. Skin is dry. No erythema noted.  Psychiatric: She has a normal mood and affect.    ED Course   Lab results:   CBC AND DIFFERENTIAL - Abnormal      Result Value   WBC 8.0     RBC 3.74 (*)    HGB 10.1 (*)    HCT 33.4 (*)    MCV 89.3     MCH 27.0     MCHC 30.2 (*)    Plt Ct 244     RDW SD 51.7 (*)    MPV 9.7     NRBC% 0.0     Absolute NRBC Count 0.00     NEUTROPHIL % 68.4     LYMPHOCYTE % 13.1     MONOCYTE % 11.7     Eosinophil % 5.0     BASOPHIL % 1.3     IG% 0.5     ABSOLUTE NEUTROPHIL COUNT 5.45     ABSOLUTE LYMPHOCYTE COUNT 1.04     Absolute Monocyte Count 0.93 (*)    Absolute Eosinophil Count 0.40      Absolute Basophil Count 0.10     Absolute Immature Granulocyte  Count 0.04 (*)   COMPREHENSIVE METABOLIC PANEL - Abnormal   Na 139     Potassium 4.1     Cl 104     CO2 28     AGAP 7     Glucose 149 (*)    BUN 33 (*)    Creatinine 1.62 (*)    Ca 9.6     ALK PHOS 134     T Bili 0.2     Total Protein 7.4     Alb 3.7     GLOBULIN 3.7     ALBUMIN/GLOBULIN RATIO 1.0 (*)    BUN/CREAT RATIO 20.4     ALT 17     AST 26     eGFR 33 (*)    Comment: Normal GFR (glomerular filtration rate) > 60 mL/min/1.73 meters squared, < 60 may include impaired kidney function. Calculation based on the Chronic Kidney Disease Epidemiology Collaboration (CK-EPI)equation refit without adjustment for race.    Imaging:   CT HEAD WO CONTRAST   Narrative:    COMPARISON: 09/25/2023   INDICATION: Trauma Head   TECHNIQUE:  CT HEAD WO CONTRAST, CT ABDOMEN PELVIS W IV CONTRAST   65 mL  IOPAMIDOL  76 % IV SOLN Dose reduction was utilized (automated exposure control, mA or kV adjustment based on patient size, or iterative image reconstruction).  FINDINGS:  CT HEAD  No intracranial hemorrhage identified.   No midline shift or acute mass effect.   No abnormal extra-axial fluid collections are seen.   Ventricles, cisterns, and sulci are unremarkable.  Mild periventricular and deep white matter hypodensities, statistically small vessel ischemic disease.   Visualized orbits are unremarkable.   Visualized paranasal sinuses are clear.   Visualized mastoid air cells are clear.   No displaced or depressed calvarial fracture identified.  CT ABDOMEN PELVIS   Lower Chest: Mild bilateral basilar subsegmental atelectasis.  Mild cardiomegaly.  Prominent coronary artery calcifications.  TAVR.  Small right pleural effusion.  Enlarging left lower lobe pulmonary nodule, now measuring 13 mm, previously measuring 10 mm.  Liver: Mild hepatomegaly.  Gallbladder and Biliary Tree: Cholelithiasis.  Negative for biliary  dilatation.  Pancreas: Unremarkable  Spleen: Unremarkable  Adrenal Glands: Unremarkable  Kidneys and Urinary Bladder: Small bilateral renal cysts.  Unremarkable kidneys and urinary bladder.  Reproductive: Hysterectomy.  Vasculature: Aortoiliac atherosclerosis without aneurysm.  Lymph Nodes: Unremarkable  Peritoneum: No free fluid. No free air.  No abscess.  Gastrointestinal Tract: Small duodenal diverticulum.  No bowel obstruction.  No evidence for acute appendicitis.  Colonic diverticulosis.  Abdominal and Pelvic Walls: Unremarkable  Bones: Multilevel spondylosis.  Mild bilateral hip osteoarthritis.    Impression:    IMPRESSION: CT HEAD 1.  No acute intracranial abnormality identified. 2.  No calvarial fracture identified.  CT ABDOMEN PELVIS  1.  No acute injury identified. 2.  Enlarging left lower lobe pulmonary nodule, now measuring 13 mm, previously measuring 10 mm.  Underlying neoplasm cannot be excluded.  Follow-up per Fleischner guidelines.    Electronically Signed by: Charlie Patch, MD on 01/03/2024 4:29 AM  XR PELVIS 1-2 VIEWS   Narrative:    INDICATION: Fall. TECHNIQUE:  XR PELVIS 1-2 VIEWS 01/03/2024 2:04 AM COMPARISON:  None.      FINDINGS: - No acute fractures.  - No destructive osseous lesions.  - Joint spaces are preserved.  - No focal soft tissue abnormalities.  - Misc: N/A    Impression:    IMPRESSION:   No acute osseous abnormalities.  Electronically Signed by: Marinda Fleming, MD on 01/03/2024 2:21 AM  CT ABDOMEN PELVIS W IV CONTRAST   Narrative:    COMPARISON: 09/25/2023   INDICATION: Trauma Head   TECHNIQUE:  CT HEAD WO CONTRAST, CT ABDOMEN PELVIS W IV CONTRAST   65 mL  IOPAMIDOL  76 % IV SOLN Dose reduction was utilized (automated exposure control, mA or kV adjustment based on patient size, or iterative image reconstruction).  FINDINGS:  CT HEAD  No intracranial hemorrhage identified.   No midline shift or acute mass  effect.   No abnormal extra-axial fluid collections are seen.   Ventricles, cisterns, and sulci are unremarkable.  Mild periventricular and deep white matter hypodensities, statistically small vessel ischemic disease.   Visualized orbits are unremarkable.   Visualized paranasal sinuses are clear.   Visualized mastoid air cells are clear.   No displaced or depressed calvarial fracture identified.  CT ABDOMEN PELVIS   Lower Chest: Mild bilateral basilar subsegmental atelectasis.  Mild cardiomegaly.  Prominent coronary artery calcifications.  TAVR.  Small right pleural effusion.  Enlarging left lower lobe pulmonary nodule, now measuring 13 mm, previously measuring 10 mm.  Liver: Mild hepatomegaly.  Gallbladder and Biliary Tree: Cholelithiasis.  Negative for biliary dilatation.  Pancreas: Unremarkable  Spleen: Unremarkable  Adrenal Glands: Unremarkable  Kidneys and Urinary Bladder: Small bilateral renal cysts.  Unremarkable kidneys and urinary bladder.  Reproductive: Hysterectomy.  Vasculature: Aortoiliac atherosclerosis without aneurysm.  Lymph Nodes: Unremarkable  Peritoneum: No free fluid. No free air.  No abscess.  Gastrointestinal Tract: Small duodenal diverticulum.  No bowel obstruction.  No evidence for acute appendicitis.  Colonic diverticulosis.  Abdominal and Pelvic Walls: Unremarkable  Bones: Multilevel spondylosis.  Mild bilateral hip osteoarthritis.    Impression:    IMPRESSION: CT HEAD 1.  No acute intracranial abnormality identified. 2.  No calvarial fracture identified.  CT ABDOMEN PELVIS  1.  No acute injury identified. 2.  Enlarging left lower lobe pulmonary nodule, now measuring 13 mm, previously measuring 10 mm.  Underlying neoplasm cannot be excluded.  Follow-up per Fleischner guidelines.    Electronically Signed by: Charlie Patch, MD on 01/03/2024 4:29 AM    ECG: ECG Results   None                                                                Pre-Sedation Procedures    Medical Decision Making 78 year old female who presents to the emergency department today for evaluation of mechanical fall, rib pain.  Her medical screening exam is generally unremarkable and she has tenderness to palpation to her right anterolateral ribs on any crepitus or deformity, clear lungs with equal breath sounds, there is no signs of head trauma there is no midline spinal tenderness, pelvis and extremities are stable.  Differential diagnosis: Likely minimal closed head injury, minimal suspicion for intracranial hemorrhage, rib contusion versus fractures, do not suspect pelvic fracture, intra-abdominal injury etc.  Get a CT head, CT abdomen pelvis, check basic labs, additional IV fentanyl  for pain, Lidoderm  patch, reevaluate.  Patient's labs and CTs are unremarkable.  Suspect closed head injury with rib contusions.  Stable for discharge outpatient follow-up.  Amount and/or Complexity of Data Reviewed Labs: ordered. Radiology: ordered.  Risk Prescription drug management.    In reviewing the patient's  old records, I reviewed their prior controlled substances prescriptions, using the PDMP.    Provider Communication  New Prescriptions   LIDOCAINE  (LIDODERM ) 5%    Place one patch onto the skin daily for 15 days. Remove & Discard patch within 12 hours or as directed by MD      Quantity: 15 patch    Refills: 0   OXYCODONE  HCL (ROXICODONE ) 5 MG IMMEDIATE RELEASE TABLET    Take one tablet (5 mg dose) by mouth every 6 (six) hours as needed for Pain for up to 3 days. Max Daily Amount: 20 mg      Quantity: 20 tablet    Refills: 0    Modified Medications   No medications on file    Discontinued Medications   No medications on file    Clinical Impression Final diagnoses:  Closed head injury, initial encounter  Rib contusion, right, initial encounter    ED Disposition     ED Disposition  Discharge    Condition  Stable   Comment  --                 Follow-up Information     WHARTON, COURTNEY, PA. Schedule an appointment as soon as possible for a visit in 3 days.   Specialty: Physician Assistant Comments: For follow up Contact information: [address] [place] [address] [phone]                  Electronically signed by:    Norleen JONETTA Hopes, MD 01/03/24 [phone]  "

## 2024-01-11 ENCOUNTER — Other Ambulatory Visit (HOSPITAL_COMMUNITY): Payer: Self-pay

## 2024-01-20 ENCOUNTER — Other Ambulatory Visit (HOSPITAL_COMMUNITY): Payer: Self-pay

## 2024-01-20 ENCOUNTER — Other Ambulatory Visit: Payer: Self-pay

## 2024-01-27 ENCOUNTER — Other Ambulatory Visit (HOSPITAL_COMMUNITY): Payer: Self-pay

## 2024-01-27 DIAGNOSIS — E1122 Type 2 diabetes mellitus with diabetic chronic kidney disease: Secondary | ICD-10-CM | POA: Diagnosis not present

## 2024-01-27 DIAGNOSIS — D649 Anemia, unspecified: Secondary | ICD-10-CM | POA: Diagnosis not present

## 2024-01-27 DIAGNOSIS — N183 Chronic kidney disease, stage 3 unspecified: Secondary | ICD-10-CM | POA: Diagnosis not present

## 2024-01-27 DIAGNOSIS — K269 Duodenal ulcer, unspecified as acute or chronic, without hemorrhage or perforation: Secondary | ICD-10-CM | POA: Diagnosis not present

## 2024-01-27 DIAGNOSIS — F419 Anxiety disorder, unspecified: Secondary | ICD-10-CM | POA: Diagnosis not present

## 2024-01-27 DIAGNOSIS — I350 Nonrheumatic aortic (valve) stenosis: Secondary | ICD-10-CM | POA: Diagnosis not present

## 2024-01-27 DIAGNOSIS — N949 Unspecified condition associated with female genital organs and menstrual cycle: Secondary | ICD-10-CM | POA: Diagnosis not present

## 2024-01-27 DIAGNOSIS — E7800 Pure hypercholesterolemia, unspecified: Secondary | ICD-10-CM | POA: Diagnosis not present

## 2024-01-27 DIAGNOSIS — I10 Essential (primary) hypertension: Secondary | ICD-10-CM | POA: Diagnosis not present

## 2024-01-27 DIAGNOSIS — N1831 Chronic kidney disease, stage 3a: Secondary | ICD-10-CM | POA: Diagnosis not present

## 2024-01-27 DIAGNOSIS — Z6832 Body mass index (BMI) 32.0-32.9, adult: Secondary | ICD-10-CM | POA: Diagnosis not present

## 2024-01-27 DIAGNOSIS — I272 Pulmonary hypertension, unspecified: Secondary | ICD-10-CM | POA: Diagnosis not present

## 2024-01-27 MED ORDER — CLOBETASOL PROPIONATE 0.05 % EX SHAM
MEDICATED_SHAMPOO | CUTANEOUS | 0 refills | Status: AC
  Filled 2024-01-27: qty 118, 30d supply, fill #0

## 2024-01-28 ENCOUNTER — Other Ambulatory Visit: Payer: Self-pay

## 2024-02-04 ENCOUNTER — Telehealth: Payer: Self-pay | Admitting: Cardiology

## 2024-02-04 DIAGNOSIS — R0683 Snoring: Secondary | ICD-10-CM | POA: Diagnosis not present

## 2024-02-04 DIAGNOSIS — R001 Bradycardia, unspecified: Secondary | ICD-10-CM

## 2024-02-04 DIAGNOSIS — R400 Somnolence: Secondary | ICD-10-CM | POA: Diagnosis not present

## 2024-02-04 NOTE — Telephone Encounter (Signed)
 STAT if HR is under 50 or over 120 (normal HR is 60-100 beats per minute)  What is your heart rate? 44,41,50 ,72,   Do you have a log of your heart rate readings (document readings)?   Do you have any other symptoms? Tired and dizzy- if she turn around quick, she almost  fall- patient would like to be seen

## 2024-02-04 NOTE — Telephone Encounter (Signed)
 Was on the phone with patient and the phone was disconnected.  Tried to reach patient again received busy signal x3  Will try again

## 2024-02-05 NOTE — Telephone Encounter (Signed)
Returned call to patient left message on personal voice to call back.

## 2024-02-06 ENCOUNTER — Ambulatory Visit: Admitting: Podiatry

## 2024-02-06 DIAGNOSIS — B351 Tinea unguium: Secondary | ICD-10-CM

## 2024-02-06 DIAGNOSIS — D631 Anemia in chronic kidney disease: Secondary | ICD-10-CM | POA: Diagnosis not present

## 2024-02-06 DIAGNOSIS — M79676 Pain in unspecified toe(s): Secondary | ICD-10-CM | POA: Diagnosis not present

## 2024-02-06 DIAGNOSIS — E1122 Type 2 diabetes mellitus with diabetic chronic kidney disease: Secondary | ICD-10-CM | POA: Diagnosis not present

## 2024-02-06 DIAGNOSIS — M79609 Pain in unspecified limb: Secondary | ICD-10-CM

## 2024-02-06 DIAGNOSIS — N209 Urinary calculus, unspecified: Secondary | ICD-10-CM | POA: Diagnosis not present

## 2024-02-06 DIAGNOSIS — N1832 Chronic kidney disease, stage 3b: Secondary | ICD-10-CM | POA: Diagnosis not present

## 2024-02-06 DIAGNOSIS — I5032 Chronic diastolic (congestive) heart failure: Secondary | ICD-10-CM | POA: Diagnosis not present

## 2024-02-06 DIAGNOSIS — L0291 Cutaneous abscess, unspecified: Secondary | ICD-10-CM | POA: Diagnosis not present

## 2024-02-06 DIAGNOSIS — E119 Type 2 diabetes mellitus without complications: Secondary | ICD-10-CM

## 2024-02-06 DIAGNOSIS — N2581 Secondary hyperparathyroidism of renal origin: Secondary | ICD-10-CM | POA: Diagnosis not present

## 2024-02-06 NOTE — Progress Notes (Signed)
  Subjective:  Patient ID: Shannon Kidd, female    DOB: [birth year],   MRN: [id number]  No chief complaint on file.   78 y.o. female presents for concern of thickened elongated and painful nails that are difficult to trim. Requesting to have them trimmed today. Relates burning and tingling in their feet. Patient is diabetic and last A1c was  Lab Results  Component Value Date   HGBA1C 5.8 (H) 12/09/2023   .   PCP:  Darnelle Elders, PA-C    . Denies any other pedal complaints. Denies n/v/f/c.   Past Medical History:  Diagnosis Date   Anxiety    Cervical myelopathy (HCC)    s/p cervical fusion @ [facility]   Chronic diastolic congestive heart failure (HCC)    CKD (chronic kidney disease), stage III (HCC)    Diabetes mellitus (HCC)    Glaucoma    HTN (hypertension)    Hyperlipemia    Morbid obesity (HCC)    Rheumatic fever    S/P TAVR (transcatheter aortic valve replacement) 07/29/2018   26 mm Edwards Sapien 3 transcatheter heart valve placed via percutaneous right transfemoral approach    Severe aortic stenosis     Objective:  Physical Exam: Vascular: DP/PT pulses 2/4 bilateral. CFT <3 seconds. Absent hair growth on digits. Edema noted to bilateral lower extremities. Xerosis noted bilaterally.  Skin. No lacerations or abrasions bilateral feet. Nails 1-5 bilateral  are thickened discolored and elongated with subungual debris.  Musculoskeletal: MMT 5/5 bilateral lower extremities in DF, PF, Inversion and Eversion. Deceased ROM in DF of ankle joint.  Neurological: Sensation intact to light touch. Protective sensation diminished bilateral.    Assessment:   1. Pain due to onychomycosis of nail   2. Type 2 diabetes mellitus without complication, without long-term current use of insulin  (HCC)      Plan:  Patient was evaluated and treated and all questions answered. -Discussed and educated patient on diabetic foot care, especially with  regards to the vascular, neurological  and musculoskeletal systems.  -Stressed the importance of good glycemic control and the detriment of not  controlling glucose levels in relation to the foot. -Discussed supportive shoes at all times and checking feet regularly.  -Mechanically debrided all nails 1-5 bilateral using sterile nail nipper and filed with dremel without incident  -Answered all patient questions -Patient to return  in 3 months for at risk foot care -Patient advised to call the office if any problems or questions arise in the meantime.   Jennefer Moats, DPM

## 2024-02-07 ENCOUNTER — Ambulatory Visit: Payer: HMO | Admitting: Podiatry

## 2024-02-07 DIAGNOSIS — Z6832 Body mass index (BMI) 32.0-32.9, adult: Secondary | ICD-10-CM | POA: Diagnosis not present

## 2024-02-07 DIAGNOSIS — J45909 Unspecified asthma, uncomplicated: Secondary | ICD-10-CM | POA: Diagnosis not present

## 2024-02-07 DIAGNOSIS — Z7984 Long term (current) use of oral hypoglycemic drugs: Secondary | ICD-10-CM | POA: Diagnosis not present

## 2024-02-07 DIAGNOSIS — K279 Peptic ulcer, site unspecified, unspecified as acute or chronic, without hemorrhage or perforation: Secondary | ICD-10-CM | POA: Diagnosis not present

## 2024-02-07 DIAGNOSIS — E785 Hyperlipidemia, unspecified: Secondary | ICD-10-CM | POA: Diagnosis not present

## 2024-02-07 DIAGNOSIS — F419 Anxiety disorder, unspecified: Secondary | ICD-10-CM | POA: Diagnosis not present

## 2024-02-07 DIAGNOSIS — K2970 Gastritis, unspecified, without bleeding: Secondary | ICD-10-CM | POA: Diagnosis not present

## 2024-02-07 DIAGNOSIS — Z8673 Personal history of transient ischemic attack (TIA), and cerebral infarction without residual deficits: Secondary | ICD-10-CM | POA: Diagnosis not present

## 2024-02-07 DIAGNOSIS — I110 Hypertensive heart disease with heart failure: Secondary | ICD-10-CM | POA: Diagnosis not present

## 2024-02-07 DIAGNOSIS — F32A Depression, unspecified: Secondary | ICD-10-CM | POA: Diagnosis not present

## 2024-02-07 DIAGNOSIS — I10 Essential (primary) hypertension: Secondary | ICD-10-CM | POA: Diagnosis not present

## 2024-02-07 DIAGNOSIS — D500 Iron deficiency anemia secondary to blood loss (chronic): Secondary | ICD-10-CM | POA: Diagnosis not present

## 2024-02-07 DIAGNOSIS — E119 Type 2 diabetes mellitus without complications: Secondary | ICD-10-CM | POA: Diagnosis not present

## 2024-02-07 DIAGNOSIS — E669 Obesity, unspecified: Secondary | ICD-10-CM | POA: Diagnosis not present

## 2024-02-07 DIAGNOSIS — Z09 Encounter for follow-up examination after completed treatment for conditions other than malignant neoplasm: Secondary | ICD-10-CM | POA: Diagnosis not present

## 2024-02-07 DIAGNOSIS — Z8711 Personal history of peptic ulcer disease: Secondary | ICD-10-CM | POA: Diagnosis not present

## 2024-02-07 DIAGNOSIS — I509 Heart failure, unspecified: Secondary | ICD-10-CM | POA: Diagnosis not present

## 2024-02-07 DIAGNOSIS — Z79899 Other long term (current) drug therapy: Secondary | ICD-10-CM | POA: Diagnosis not present

## 2024-02-07 NOTE — Telephone Encounter (Signed)
 Patient reports when she checked her HR on her pulse ox a few days ago she got readings in the 40s. She states when she checks her BP the HR is higher in the 70s.  BP/HR today: 150/70, HR 72. She states she was nervous today because she was having an endoscopy. She reports she feels fine now. No fatigue or dizziness, HR back in the 70's.   She states it was just for 2 days she had HR in the 40s and felt fatigued/dizzy. She just wanted to call to see if this was something to be concerned about.  VS WNL for patient, no complaints. Advised patient to call if anything changes.  Will forward to Dr. Audery Blazing to review.

## 2024-02-07 NOTE — Telephone Encounter (Signed)
 Patient is returning call.

## 2024-02-07 NOTE — Telephone Encounter (Signed)
 Left message for pt to call.

## 2024-02-08 DIAGNOSIS — Z8719 Personal history of other diseases of the digestive system: Secondary | ICD-10-CM | POA: Diagnosis not present

## 2024-02-08 DIAGNOSIS — K319 Disease of stomach and duodenum, unspecified: Secondary | ICD-10-CM | POA: Diagnosis not present

## 2024-02-08 DIAGNOSIS — Z8711 Personal history of peptic ulcer disease: Secondary | ICD-10-CM | POA: Diagnosis not present

## 2024-02-10 NOTE — Telephone Encounter (Signed)
 Spoke with pt, Aware of dr Ludwig Clarks recommendations.

## 2024-02-18 ENCOUNTER — Other Ambulatory Visit: Payer: Self-pay | Admitting: Cardiology

## 2024-02-18 ENCOUNTER — Other Ambulatory Visit: Payer: Self-pay

## 2024-02-18 ENCOUNTER — Other Ambulatory Visit (HOSPITAL_COMMUNITY): Payer: Self-pay

## 2024-02-18 DIAGNOSIS — I350 Nonrheumatic aortic (valve) stenosis: Secondary | ICD-10-CM

## 2024-02-18 MED ORDER — AMLODIPINE BESYLATE 5 MG PO TABS
5.0000 mg | ORAL_TABLET | Freq: Every morning | ORAL | 1 refills | Status: AC
  Filled 2024-02-18: qty 90, 90d supply, fill #0
  Filled 2024-05-21: qty 90, 90d supply, fill #1

## 2024-02-18 MED ORDER — ROSUVASTATIN CALCIUM 20 MG PO TABS
20.0000 mg | ORAL_TABLET | Freq: Every evening | ORAL | 1 refills | Status: AC
  Filled 2024-02-18: qty 90, 90d supply, fill #0
  Filled 2024-05-21: qty 90, 90d supply, fill #1

## 2024-02-19 ENCOUNTER — Other Ambulatory Visit (HOSPITAL_COMMUNITY): Payer: Self-pay

## 2024-03-10 DIAGNOSIS — N1831 Chronic kidney disease, stage 3a: Secondary | ICD-10-CM | POA: Diagnosis not present

## 2024-03-10 DIAGNOSIS — I10 Essential (primary) hypertension: Secondary | ICD-10-CM | POA: Diagnosis not present

## 2024-03-10 DIAGNOSIS — I350 Nonrheumatic aortic (valve) stenosis: Secondary | ICD-10-CM | POA: Diagnosis not present

## 2024-03-10 DIAGNOSIS — N183 Chronic kidney disease, stage 3 unspecified: Secondary | ICD-10-CM | POA: Diagnosis not present

## 2024-03-16 ENCOUNTER — Other Ambulatory Visit

## 2024-03-16 DIAGNOSIS — E1165 Type 2 diabetes mellitus with hyperglycemia: Secondary | ICD-10-CM | POA: Diagnosis not present

## 2024-03-17 ENCOUNTER — Ambulatory Visit: Payer: Self-pay | Admitting: Endocrinology

## 2024-03-17 LAB — BASIC METABOLIC PANEL WITH GFR
BUN/Creatinine Ratio: 15 (calc) (ref 6–22)
BUN: 23 mg/dL (ref 7–25)
CO2: 27 mmol/L (ref 20–32)
Calcium: 9.2 mg/dL (ref 8.6–10.4)
Chloride: 104 mmol/L (ref 98–110)
Creat: 1.53 mg/dL — ABNORMAL HIGH (ref 0.60–1.00)
Glucose, Bld: 136 mg/dL — ABNORMAL HIGH (ref 65–99)
Potassium: 4.6 mmol/L (ref 3.5–5.3)
Sodium: 142 mmol/L (ref 135–146)
eGFR: 35 mL/min/{1.73_m2} — ABNORMAL LOW

## 2024-03-17 LAB — HEMOGLOBIN A1C
Hgb A1c MFr Bld: 7.4 % — ABNORMAL HIGH
Mean Plasma Glucose: 166 mg/dL
eAG (mmol/L): 9.2 mmol/L

## 2024-03-19 ENCOUNTER — Encounter: Payer: Self-pay | Admitting: Endocrinology

## 2024-03-19 ENCOUNTER — Other Ambulatory Visit: Payer: Self-pay

## 2024-03-19 ENCOUNTER — Other Ambulatory Visit (HOSPITAL_COMMUNITY): Payer: Self-pay

## 2024-03-19 ENCOUNTER — Ambulatory Visit: Admitting: Endocrinology

## 2024-03-19 VITALS — BP 124/60 | HR 55 | Resp 20 | Ht 60.0 in | Wt 168.8 lb

## 2024-03-19 DIAGNOSIS — Z7984 Long term (current) use of oral hypoglycemic drugs: Secondary | ICD-10-CM | POA: Diagnosis not present

## 2024-03-19 DIAGNOSIS — E1165 Type 2 diabetes mellitus with hyperglycemia: Secondary | ICD-10-CM

## 2024-03-19 MED ORDER — GLIPIZIDE 2.5 MG PO TABS
2.5000 mg | ORAL_TABLET | Freq: Every day | ORAL | 3 refills | Status: DC
  Filled 2024-03-19: qty 90, 90d supply, fill #0
  Filled 2024-06-30: qty 90, 90d supply, fill #1

## 2024-03-19 NOTE — Progress Notes (Signed)
 Outpatient Endocrinology Note [place] Shigeko Manard, MD  03/19/24  Patient's Name: Shannon Kidd    DOB: 11/11/1945    MRN: [id number]                                                    REASON OF VISIT: Follow up of type 2 diabetes mellitus  PCP: Darnelle Elders, PA-C  HISTORY OF PRESENT ILLNESS:   Shannon Kidd is a 78 y.o. old female with past medical history listed below, is here for follow up for type 2 diabetes mellitus.  Patient was last seen by Dr. Hubert Madden in July 2024.  Pertinent Diabetes History: Patient was previously seen by Dr. Hubert Madden and was last time seen in July 2024.  Patient was diagnosed with type 2 diabetes mellitus in 2000.  Patient has uncontrolled type 2 diabetes mellitus.  Chronic Diabetes Complications : Retinopathy: no. Last ophthalmology exam was done on annually, following with ophthalmology regularly.  Nephropathy: CKD IIIb, on ACE/ARB /losartan  following with nephrology. Peripheral neuropathy: no Coronary artery disease: no Stroke: yes, reports cranial nerve VI palsy.  Relevant comorbidities and cardiovascular risk factors: Obesity: yes Body mass index is 32.97 kg/m.  Hypertension: Yes  Hyperlipidemia : Yes, on statin   Current / Home Diabetic regimen includes:  Metformin  extended release 500 mg 1 time a day.  Prior diabetic medications: Diarrhea with higher dose of metformin . Jardiance  in the past, caused nausea, vomiting and weakness.  Januvia in the past.  Pioglitazone in the past caused swelling. Farxiga  stopped due to high cost. Glipizide  extended release 2.5 mg daily.  Glycemic data:   Glucometer One Touch Verio flex, download from May 29 to June 12 , 2025 reviewed.  Average blood sugar 146.  Lowest blood sugar 130, highest blood sugar 182.  She has been taking mostly in the morning fasting, somewhat out of blood sugar 135, 153, 141, 182, 158, blood sugar in the evening 130.  No hypoglycemia.   Hypoglycemia: Patient has minor hypoglycemic  episodes. Patient has hypoglycemia awareness.  Factors modifying glucose control: 1.  Diabetic diet assessment: 3 meals a day.  2.  Staying active or exercising: No formal exercise.  3.  Medication compliance: compliant all of the time.  Interval history  Hemoglobin A1c 7.4%.  Renal function is stable.  Glucometer data as reviewed above.  She stopped taking glipizide  from the last visit due to hypoglycemia at that time.  She is no longer having hypoglycemia.  He has no other complaints today.   REVIEW OF SYSTEMS As per history of present illness.   PAST MEDICAL HISTORY: Past Medical History:  Diagnosis Date   Anxiety    Cervical myelopathy (HCC)    s/p cervical fusion @ [facility]   Chronic diastolic congestive heart failure (HCC)    CKD (chronic kidney disease), stage III (HCC)    Diabetes mellitus (HCC)    Glaucoma    HTN (hypertension)    Hyperlipemia    Morbid obesity (HCC)    Rheumatic fever    S/P TAVR (transcatheter aortic valve replacement) 07/29/2018   26 mm Edwards Sapien 3 transcatheter heart valve placed via percutaneous right transfemoral approach    Severe aortic stenosis     PAST SURGICAL HISTORY: Past Surgical History:  Procedure Laterality Date   APPENDECTOMY     CARDIAC CATHETERIZATION  EYE SURGERY Bilateral    cataract extraction bilateral with lens   INTRAOPERATIVE TRANSTHORACIC ECHOCARDIOGRAM  07/29/2018   Procedure: INTRAOPERATIVE TRANSTHORACIC ECHOCARDIOGRAM;  Surgeon: Arnoldo Lapping, MD;  Location: [facility] OR;  Service: Open Heart Surgery;;   NECK SURGERY     RIGHT/LEFT HEART CATH AND CORONARY ANGIOGRAPHY N/A 06/04/2018   Procedure: RIGHT/LEFT HEART CATH AND CORONARY ANGIOGRAPHY;  Surgeon: Arnoldo Lapping, MD;  Location: [facility] INVASIVE CV LAB;  Service: Cardiovascular;  Laterality: N/A;   TONSILLECTOMY     TRANSCATHETER AORTIC VALVE REPLACEMENT, TRANSFEMORAL N/A 07/29/2018   Procedure: TRANSCATHETER AORTIC VALVE REPLACEMENT, TRANSFEMORAL;  Surgeon:  Arnoldo Lapping, MD;  Location: [facility] OR;  Service: Open Heart Surgery;  Laterality: N/A;   TUBAL LIGATION     VAGINAL HYSTERECTOMY      ALLERGIES: Allergies  Allergen Reactions   Sitagliptin Nausea And Vomiting   Jardiance  [Empagliflozin ] Other (See Comments)    N/V, diarrhea, weakness   Pioglitazone Swelling    FAMILY HISTORY:  Family History  Problem Relation Age of Onset   Diabetes Mother    Cancer Mother        Colon   Diabetes Sister    Diabetes Paternal Grandmother    Breast cancer Maternal Grandmother 77    SOCIAL HISTORY: Social History   Socioeconomic History   Marital status: Widowed    Spouse name: Not on file   Number of children: 2   Years of education: Not on file   Highest education level: Not on file  Occupational History   Not on file  Tobacco Use   Smoking status: Never   Smokeless tobacco: Never  Vaping Use   Vaping status: Never Used  Substance and Sexual Activity   Alcohol use: No    Alcohol/week: 0.0 standard drinks of alcohol   Drug use: Never   Sexual activity: Not on file  Other Topics Concern   Not on file  Social History Narrative   Not on file   Social Drivers of Health   Financial Resource Strain: Low Risk  (10/30/2023)   Received from [facility]   Overall Financial Resource Strain (CARDIA)    Difficulty of Paying Living Expenses: Not very hard  Food Insecurity: No Food Insecurity (10/30/2023)   Received from [facility]   Hunger Vital Sign    Worried About Running Out of Food in the Last Year: Never true    Ran Out of Food in the Last Year: Never true  Transportation Needs: No Transportation Needs (10/30/2023)   Received from [facility] - Transportation    Lack of Transportation (Medical): No    Lack of Transportation (Non-Medical): No  Physical Activity: Insufficiently Active (10/30/2023)   Received from [facility]   Exercise Vital Sign    Days of Exercise per Week: 2 days    Minutes of Exercise per  Session: 30 min  Stress: No Stress Concern Present (02/07/2024)   Received from [facility] - Occupational Stress Questionnaire    Feeling of Stress : Only a little  Social Connections: Somewhat Isolated (10/30/2023)   Received from [facility]   Social Network    How would you rate your social network (family, work, friends)?: Restricted participation with some degree of social isolation    MEDICATIONS:  Current Outpatient Medications  Medication Sig Dispense Refill   amLODipine  (NORVASC ) 5 MG tablet Take 1 tablet (5 mg total) by mouth every morning. 90 tablet 1  Ascorbic Acid (VITAMIN C) 1000 MG tablet Take 1,000 mg by mouth daily.     aspirin  81 MG tablet Take 81 mg by mouth daily.     Blood Glucose Monitoring Suppl (ONETOUCH VERIO IQ SYSTEM) w/Device KIT USE TO CHECK BLOOD SUGAR ONCE DAILY Dx code E11.9 1 kit 2   Cholecalciferol (VITAMIN D) 2000 UNITS tablet Take 2,000 Units by mouth daily.     Clobetasol  Propionate 0.05 % shampoo Apply 1 application leave in for 3-82minutes, then rinse. Do twice a week as directed. 118 mL 0   glipiZIDE  2.5 MG TABS Take 1 tablet (2.5 mg) by mouth daily with breakfast. 90 tablet 3   glucose blood (ONETOUCH VERIO) test strip Use as instructed to check blood sugar once a day. 50 each 0   losartan  (COZAAR ) 50 MG tablet Take 1 tablet (50 mg total) by mouth daily. 90 tablet 1   metFORMIN  (GLUCOPHAGE -XR) 500 MG 24 hr tablet Take 1 tablet (500 mg total) by mouth every evening. 90 tablet 1   ondansetron  (ZOFRAN -ODT) 4 MG disintegrating tablet Dissolve 1 tablet (4 mg total) by mouth every 8 (eight) hours as needed. 30 tablet 0   ONETOUCH DELICA LANCETS 33G MISC USE TO CHECK BLOOD SUGAR ONCE DAILY Dx code E11.9 100 each 2   pantoprazole  (PROTONIX ) 40 MG tablet Take 1 tablet (40 mg total) by mouth 1/2 - 1 hour before a meal 2 (two) times daily. 180 tablet 0   pramipexole  (MIRAPEX ) 0.25 MG tablet Take 1 tablet (0.25 mg  total) by mouth daily. 90 tablet 1   rosuvastatin  (CRESTOR ) 20 MG tablet Take 1 tablet (20 mg total) by mouth at bedtime. 90 tablet 1   sertraline  (ZOLOFT ) 50 MG tablet Take 1.5 tablets (75 mg total) by mouth daily. 135 tablet 2   vitamin B-12 (CYANOCOBALAMIN ) 1000 MCG tablet Take 1,000 mcg by mouth daily.     metFORMIN  (GLUCOPHAGE -XR) 500 MG 24 hr tablet Take 1 tablet (500 mg total) by mouth every evening. 90 tablet 3   pramipexole  (MIRAPEX ) 0.25 MG tablet Take 0.25 mg by mouth at bedtime.      No current facility-administered medications for this visit.    PHYSICAL EXAM: Vitals:   03/19/24 0955  BP: 124/60  Pulse: (!) 55  Resp: 20  SpO2: 97%  Weight: 168 lb 12.8 oz (76.6 kg)  Height: 5' (1.524 m)   Body mass index is 32.97 kg/m.  Wt Readings from Last 3 Encounters:  03/19/24 168 lb 12.8 oz (76.6 kg)  12/12/23 171 lb (77.6 kg)  08/14/23 186 lb (84.4 kg)    General: Well developed, well nourished female in no apparent distress.  HEENT: AT/[place], no external lesions.  Eyes: Conjunctiva clear and no icterus. Neck: Neck supple  Lungs: Respirations not labored Neurologic: Alert, oriented, normal speech Extremities / Skin: Dry.  Psychiatric: Does not appear depressed or anxious  Diabetic Foot Exam - Simple   Simple Foot Form Diabetic Foot exam was performed with the following findings: Yes 03/19/2024 10:15 AM  Visual Inspection No deformities, no ulcerations, no other skin breakdown bilaterally: Yes Sensation Testing Intact to touch and monofilament testing bilaterally: Yes Pulse Check See comments: Yes Comments DP 1+ bilaterally.     LABS Reviewed Lab Results  Component Value Date   HGBA1C 7.4 (H) 03/16/2024   HGBA1C 5.8 (H) 12/09/2023   HGBA1C 7.8 (H) 08/07/2023   Lab Results  Component Value Date   FRUCTOSAMINE 292 (H) 08/07/2023   FRUCTOSAMINE 255 07/27/2019  FRUCTOSAMINE 258 08/25/2014   Lab Results  Component Value Date   CHOL 73 10/25/2022   HDL 35.20  (L) 10/25/2022   LDLCALC 19 10/25/2022   TRIG 90.0 10/25/2022   CHOLHDL 2 10/25/2022   Lab Results  Component Value Date   MICRALBCREAT 41.9 (H) 08/07/2023   MICRALBCREAT 64.6 (H) 01/28/2023   Lab Results  Component Value Date   CREATININE 1.53 (H) 03/16/2024   Lab Results  Component Value Date   GFR 38.34 (L) 08/07/2023    ASSESSMENT / PLAN  1. Uncontrolled type 2 diabetes mellitus with hyperglycemia, without long-term current use of insulin  (HCC)     Diabetes Mellitus type 2, complicated by diabetic nephropathy/CKD. - Diabetic status / severity: Uncontrolled.  Lab Results  Component Value Date   HGBA1C 7.4 (H) 03/16/2024    - Hemoglobin A1c goal : <7%  Previously hemoglobin A1c was 5.8%, falsely low due to anemia due to GI bleeding.  Glucometer data as reviewed below.  No more hypoglycemia.  Adjusted diabetes regimen as follows.  Was planned for SGLT2 inhibitor due to CKD and microalbuminuria, Farxiga  was high cost and she had side effects with Jardiance  in the past.  - Medications: See below.  I) continue metformin  extended release 500 mg 1 tablet daily. II) start glipizide  regular 2.5 mg daily with breakfast.  - Home glucose testing: Check in the morning fasting daily and at bedtime few times a week. - Discussed/ Gave Hypoglycemia treatment plan.  # Consult : not required at this time.   # Annual urine for microalbuminuria/ creatinine ratio,+ microalbuminuria currently, continue ACE/ARB /losartan , she has CKD 3B, following with nephrology. Last  Lab Results  Component Value Date   MICRALBCREAT 41.9 (H) 08/07/2023    # Foot check nightly.  # Annual dilated diabetic eye exams.   - Diet: Make healthy diabetic food choices  2. Blood pressure  -  BP Readings from Last 1 Encounters:  03/19/24 124/60    - Control is in target.  - No change in current plans.  3. Lipid status / Hyperlipidemia - Last  Lab Results  Component Value Date   LDLCALC 19  10/25/2022   - Continue rosuvastatin  20 mg daily.  Managed by primary care provider.  Diagnoses and all orders for this visit:  Uncontrolled type 2 diabetes mellitus with hyperglycemia, without long-term current use of insulin  (HCC) -     glipiZIDE  2.5 MG TABS; Take 1 tablet (2.5 mg) by mouth daily with breakfast. -     Basic metabolic panel with GFR -     Microalbumin / creatinine urine ratio -     Hemoglobin A1c     DISPOSITION Follow up in clinic in 3 months suggested.  Labs prior to follow-up visit as ordered.   All questions answered and patient verbalized understanding of the plan.  [place] Netta Fodge, MD [facility] [address] [place], [address] [place], [place] [address] Phone # [phone]  At least part of this note was generated using voice recognition software. Inadvertent word errors may have occurred, which were not recognized during the proofreading process.

## 2024-03-19 NOTE — Addendum Note (Signed)
 Addended by: Liane Tribbey, [place] on: 03/19/2024 11:15 AM   Modules accepted: Orders

## 2024-03-19 NOTE — Patient Instructions (Signed)
 Diabetes regimen: Metformin  ER 500 mg 1 tab daily / take with SUPPER. Glipizide  2.5 mg with breakfast daily.

## 2024-04-06 ENCOUNTER — Other Ambulatory Visit (HOSPITAL_COMMUNITY): Payer: Self-pay

## 2024-04-06 DIAGNOSIS — N1831 Chronic kidney disease, stage 3a: Secondary | ICD-10-CM | POA: Diagnosis not present

## 2024-04-06 DIAGNOSIS — I350 Nonrheumatic aortic (valve) stenosis: Secondary | ICD-10-CM | POA: Diagnosis not present

## 2024-04-06 DIAGNOSIS — E7800 Pure hypercholesterolemia, unspecified: Secondary | ICD-10-CM | POA: Diagnosis not present

## 2024-04-06 DIAGNOSIS — N183 Chronic kidney disease, stage 3 unspecified: Secondary | ICD-10-CM | POA: Diagnosis not present

## 2024-04-06 DIAGNOSIS — I10 Essential (primary) hypertension: Secondary | ICD-10-CM | POA: Diagnosis not present

## 2024-04-08 DIAGNOSIS — I10 Essential (primary) hypertension: Secondary | ICD-10-CM | POA: Diagnosis not present

## 2024-04-08 DIAGNOSIS — N183 Chronic kidney disease, stage 3 unspecified: Secondary | ICD-10-CM | POA: Diagnosis not present

## 2024-04-08 DIAGNOSIS — N1831 Chronic kidney disease, stage 3a: Secondary | ICD-10-CM | POA: Diagnosis not present

## 2024-04-08 DIAGNOSIS — I350 Nonrheumatic aortic (valve) stenosis: Secondary | ICD-10-CM | POA: Diagnosis not present

## 2024-04-21 ENCOUNTER — Other Ambulatory Visit (HOSPITAL_COMMUNITY): Payer: Self-pay

## 2024-04-22 ENCOUNTER — Other Ambulatory Visit: Payer: Self-pay

## 2024-04-22 ENCOUNTER — Other Ambulatory Visit (HOSPITAL_COMMUNITY): Payer: Self-pay

## 2024-04-22 MED ORDER — PRAMIPEXOLE DIHYDROCHLORIDE 0.25 MG PO TABS
0.2500 mg | ORAL_TABLET | Freq: Every day | ORAL | 0 refills | Status: DC
  Filled 2024-04-22: qty 90, 90d supply, fill #0

## 2024-04-23 ENCOUNTER — Telehealth: Payer: Self-pay | Admitting: Endocrinology

## 2024-04-23 ENCOUNTER — Other Ambulatory Visit: Payer: Self-pay

## 2024-04-23 DIAGNOSIS — E1165 Type 2 diabetes mellitus with hyperglycemia: Secondary | ICD-10-CM

## 2024-04-23 MED ORDER — ONETOUCH VERIO VI STRP: ORAL_STRIP | 1 refills | Status: AC

## 2024-04-23 NOTE — Telephone Encounter (Signed)
 MEDICATION: ONETOUCH VERIO test strips   PHARMACY:  [facility] on [address] in [place]  HAS THE PATIENT CONTACTED THEIR PHARMACY?  yes  IS THIS A 90 DAY SUPPLY : unknown  IS PATIENT OUT OF MEDICATION: yes  IF NOT; HOW MUCH IS LEFT:   LAST APPOINTMENT DATE: @6 /09/2024  NEXT APPOINTMENT DATE:@9 /18/2025  DO WE HAVE YOUR PERMISSION TO LEAVE A DETAILED MESSAGE?: yes   OTHER COMMENTS: patient advises that she use approximately 50 strips per month for testing purposes. Is also requesting refills until next visit or 90 day supply

## 2024-04-30 ENCOUNTER — Other Ambulatory Visit: Payer: Self-pay

## 2024-04-30 DIAGNOSIS — E1165 Type 2 diabetes mellitus with hyperglycemia: Secondary | ICD-10-CM

## 2024-05-01 ENCOUNTER — Other Ambulatory Visit (HOSPITAL_COMMUNITY): Payer: Self-pay

## 2024-05-01 ENCOUNTER — Other Ambulatory Visit: Payer: Self-pay

## 2024-05-04 ENCOUNTER — Other Ambulatory Visit (HOSPITAL_COMMUNITY): Payer: Self-pay

## 2024-05-04 ENCOUNTER — Other Ambulatory Visit: Payer: Self-pay

## 2024-05-04 DIAGNOSIS — N949 Unspecified condition associated with female genital organs and menstrual cycle: Secondary | ICD-10-CM | POA: Diagnosis not present

## 2024-05-04 DIAGNOSIS — L989 Disorder of the skin and subcutaneous tissue, unspecified: Secondary | ICD-10-CM | POA: Diagnosis not present

## 2024-05-04 MED ORDER — PANTOPRAZOLE SODIUM 40 MG PO TBEC
DELAYED_RELEASE_TABLET | ORAL | 0 refills | Status: DC
  Filled 2024-05-04: qty 180, 90d supply, fill #0

## 2024-05-04 MED ORDER — LOSARTAN POTASSIUM 50 MG PO TABS
50.0000 mg | ORAL_TABLET | Freq: Every day | ORAL | 0 refills | Status: AC
  Filled 2024-05-04: qty 90, 90d supply, fill #0

## 2024-05-07 DIAGNOSIS — N1831 Chronic kidney disease, stage 3a: Secondary | ICD-10-CM | POA: Diagnosis not present

## 2024-05-07 DIAGNOSIS — I10 Essential (primary) hypertension: Secondary | ICD-10-CM | POA: Diagnosis not present

## 2024-05-07 DIAGNOSIS — E7800 Pure hypercholesterolemia, unspecified: Secondary | ICD-10-CM | POA: Diagnosis not present

## 2024-05-07 DIAGNOSIS — N183 Chronic kidney disease, stage 3 unspecified: Secondary | ICD-10-CM | POA: Diagnosis not present

## 2024-05-07 DIAGNOSIS — I350 Nonrheumatic aortic (valve) stenosis: Secondary | ICD-10-CM | POA: Diagnosis not present

## 2024-05-08 ENCOUNTER — Ambulatory Visit: Admitting: Podiatry

## 2024-05-08 ENCOUNTER — Encounter: Payer: Self-pay | Admitting: Podiatry

## 2024-05-08 DIAGNOSIS — E119 Type 2 diabetes mellitus without complications: Secondary | ICD-10-CM

## 2024-05-08 DIAGNOSIS — N183 Chronic kidney disease, stage 3 unspecified: Secondary | ICD-10-CM | POA: Diagnosis not present

## 2024-05-08 DIAGNOSIS — M79609 Pain in unspecified limb: Secondary | ICD-10-CM

## 2024-05-08 DIAGNOSIS — B351 Tinea unguium: Secondary | ICD-10-CM

## 2024-05-08 DIAGNOSIS — I350 Nonrheumatic aortic (valve) stenosis: Secondary | ICD-10-CM | POA: Diagnosis not present

## 2024-05-08 DIAGNOSIS — N1831 Chronic kidney disease, stage 3a: Secondary | ICD-10-CM | POA: Diagnosis not present

## 2024-05-08 DIAGNOSIS — I10 Essential (primary) hypertension: Secondary | ICD-10-CM | POA: Diagnosis not present

## 2024-05-08 NOTE — Progress Notes (Signed)
  Subjective:  Patient ID: Shannon Kidd, female    DOB: Nov 04, 1945,   MRN: [id number]  Chief Complaint  Patient presents with   Diabetes    Do my toenails.  Saw Dr. Mercie - 12/12/2023; A1c - 7.4    78 y.o. female presents for concern of thickened elongated and painful nails that are difficult to trim. Requesting to have them trimmed today. Relates burning and tingling in their feet. Patient is diabetic and last A1c was  Lab Results  Component Value Date   HGBA1C 7.4 (H) 03/16/2024   .   PCP:  Katina Pfeiffer, PA-C    . Denies any other pedal complaints. Denies n/v/f/c.   Past Medical History:  Diagnosis Date   Anxiety    Cervical myelopathy (HCC)    s/p cervical fusion @ [facility]   Chronic diastolic congestive heart failure (HCC)    CKD (chronic kidney disease), stage III (HCC)    Diabetes mellitus (HCC)    Glaucoma    HTN (hypertension)    Hyperlipemia    Morbid obesity (HCC)    Rheumatic fever    S/P TAVR (transcatheter aortic valve replacement) 07/29/2018   26 mm Edwards Sapien 3 transcatheter heart valve placed via percutaneous right transfemoral approach    Severe aortic stenosis     Objective:  Physical Exam: Vascular: DP/PT pulses 2/4 bilateral. CFT <3 seconds. Absent hair growth on digits. Edema noted to bilateral lower extremities. Xerosis noted bilaterally.  Skin. No lacerations or abrasions bilateral feet. Nails 1-5 bilateral  are thickened discolored and elongated with subungual debris.  Musculoskeletal: MMT 5/5 bilateral lower extremities in DF, PF, Inversion and Eversion. Deceased ROM in DF of ankle joint.  Neurological: Sensation intact to light touch. Protective sensation diminished bilateral.    Assessment:   1. Pain due to onychomycosis of nail   2. Type 2 diabetes mellitus without complication, without long-term current use of insulin  (HCC)       Plan:  Patient was evaluated and treated and all questions answered. -Discussed and educated  patient on diabetic foot care, especially with  regards to the vascular, neurological and musculoskeletal systems.  -Stressed the importance of good glycemic control and the detriment of not  controlling glucose levels in relation to the foot. -Discussed supportive shoes at all times and checking feet regularly.  -Mechanically debrided all nails 1-5 bilateral using sterile nail nipper and filed with dremel without incident  -Answered all patient questions -Patient to return  in 3 months for at risk foot care -Patient advised to call the office if any problems or questions arise in the meantime.   Asberry Failing, DPM

## 2024-05-15 DIAGNOSIS — N898 Other specified noninflammatory disorders of vagina: Secondary | ICD-10-CM | POA: Diagnosis not present

## 2024-05-22 ENCOUNTER — Other Ambulatory Visit (HOSPITAL_COMMUNITY): Payer: Self-pay

## 2024-05-26 DIAGNOSIS — N898 Other specified noninflammatory disorders of vagina: Secondary | ICD-10-CM | POA: Diagnosis not present

## 2024-05-28 DIAGNOSIS — M8580 Other specified disorders of bone density and structure, unspecified site: Secondary | ICD-10-CM | POA: Diagnosis not present

## 2024-05-28 DIAGNOSIS — E1122 Type 2 diabetes mellitus with diabetic chronic kidney disease: Secondary | ICD-10-CM | POA: Diagnosis not present

## 2024-05-28 DIAGNOSIS — R2689 Other abnormalities of gait and mobility: Secondary | ICD-10-CM | POA: Diagnosis not present

## 2024-05-28 DIAGNOSIS — R251 Tremor, unspecified: Secondary | ICD-10-CM | POA: Diagnosis not present

## 2024-05-28 DIAGNOSIS — E7800 Pure hypercholesterolemia, unspecified: Secondary | ICD-10-CM | POA: Diagnosis not present

## 2024-05-28 DIAGNOSIS — I10 Essential (primary) hypertension: Secondary | ICD-10-CM | POA: Diagnosis not present

## 2024-05-28 DIAGNOSIS — F418 Other specified anxiety disorders: Secondary | ICD-10-CM | POA: Diagnosis not present

## 2024-05-28 DIAGNOSIS — Z1331 Encounter for screening for depression: Secondary | ICD-10-CM | POA: Diagnosis not present

## 2024-05-28 DIAGNOSIS — N949 Unspecified condition associated with female genital organs and menstrual cycle: Secondary | ICD-10-CM | POA: Diagnosis not present

## 2024-05-28 DIAGNOSIS — G629 Polyneuropathy, unspecified: Secondary | ICD-10-CM | POA: Diagnosis not present

## 2024-05-28 DIAGNOSIS — N183 Chronic kidney disease, stage 3 unspecified: Secondary | ICD-10-CM | POA: Diagnosis not present

## 2024-05-28 DIAGNOSIS — E1165 Type 2 diabetes mellitus with hyperglycemia: Secondary | ICD-10-CM | POA: Diagnosis not present

## 2024-06-07 DIAGNOSIS — N183 Chronic kidney disease, stage 3 unspecified: Secondary | ICD-10-CM | POA: Diagnosis not present

## 2024-06-07 DIAGNOSIS — I10 Essential (primary) hypertension: Secondary | ICD-10-CM | POA: Diagnosis not present

## 2024-06-07 DIAGNOSIS — I350 Nonrheumatic aortic (valve) stenosis: Secondary | ICD-10-CM | POA: Diagnosis not present

## 2024-06-07 DIAGNOSIS — E7800 Pure hypercholesterolemia, unspecified: Secondary | ICD-10-CM | POA: Diagnosis not present

## 2024-06-07 DIAGNOSIS — N1831 Chronic kidney disease, stage 3a: Secondary | ICD-10-CM | POA: Diagnosis not present

## 2024-06-18 ENCOUNTER — Other Ambulatory Visit: Payer: Self-pay

## 2024-06-18 ENCOUNTER — Other Ambulatory Visit (HOSPITAL_COMMUNITY): Payer: Self-pay

## 2024-06-18 MED ORDER — PROPRANOLOL HCL 40 MG PO TABS
40.0000 mg | ORAL_TABLET | Freq: Every day | ORAL | 1 refills | Status: DC
  Filled 2024-06-18: qty 30, 30d supply, fill #0

## 2024-06-19 ENCOUNTER — Other Ambulatory Visit: Payer: Self-pay

## 2024-06-25 ENCOUNTER — Other Ambulatory Visit

## 2024-06-29 ENCOUNTER — Ambulatory Visit: Admitting: Endocrinology

## 2024-06-30 ENCOUNTER — Other Ambulatory Visit (HOSPITAL_COMMUNITY): Payer: Self-pay

## 2024-07-01 ENCOUNTER — Other Ambulatory Visit: Payer: Self-pay

## 2024-07-01 ENCOUNTER — Telehealth: Payer: Self-pay | Admitting: Cardiology

## 2024-07-01 ENCOUNTER — Other Ambulatory Visit (HOSPITAL_COMMUNITY): Payer: Self-pay

## 2024-07-01 DIAGNOSIS — R0602 Shortness of breath: Secondary | ICD-10-CM

## 2024-07-01 MED ORDER — FUROSEMIDE 20 MG PO TABS
20.0000 mg | ORAL_TABLET | Freq: Every day | ORAL | 3 refills | Status: AC
  Filled 2024-07-01: qty 90, 90d supply, fill #0
  Filled 2024-09-26: qty 90, 90d supply, fill #1

## 2024-07-01 NOTE — Telephone Encounter (Signed)
  Per MyChart scheduling message:  Pt c/o Shortness Of Breath: STAT if SOB developed within the last 24 hours or pt is noticeably SOB on the phone  1. Are you currently SOB (can you hear that pt is SOB on the phone)?   2. How long have you been experiencing SOB?   3. Are you SOB when sitting or when up moving around?   4. Are you currently experiencing any other symptoms?   1  not around the house 2  about 1 and 1/2 weeks ago . 3  only when walking longer distance, like to the       car from my apartment or from the car into       [place], store

## 2024-07-01 NOTE — Telephone Encounter (Signed)
Spoke with pt, Aware of dr crenshaw's recommendations.  ?New script sent to the pharmacy  ?Lab orders mailed to the pt  ?

## 2024-07-01 NOTE — Telephone Encounter (Signed)
 Spoke with patient of Dr. Pietro. Last visit 06/2023. She has SOB with minimal exertion/walking for 1.5 weeks. She feels this has progressed. She has had weight gain since August due to diet and eating late at night per her report. She report some swelling in LE and maybe abdomen. She wears compression socks. She does not want to go to church bc of her limitations with walking/SOB.   185lbs at home 06/30/24 174lbs with PCP 05/28/24  She also reports feeling off balance for several months. PT is planned.   Scheduled for 07/07/24 with EMERSON Pavy PA -- soonest APP visit that worked with schedule.   Routed to MD to see if short-term diuretic may be advised until appt? Last metabolic panel I would locate is from 12/2023 - [facility] BUN 8 - 27 mg/dL 33 High    Creatinine 9.42 - 1.00 mg/dL 8.37 High

## 2024-07-02 ENCOUNTER — Encounter: Payer: Self-pay | Admitting: Cardiology

## 2024-07-02 NOTE — Telephone Encounter (Signed)
 Error

## 2024-07-03 DIAGNOSIS — E538 Deficiency of other specified B group vitamins: Secondary | ICD-10-CM | POA: Diagnosis not present

## 2024-07-03 DIAGNOSIS — N2581 Secondary hyperparathyroidism of renal origin: Secondary | ICD-10-CM | POA: Diagnosis not present

## 2024-07-03 DIAGNOSIS — E1142 Type 2 diabetes mellitus with diabetic polyneuropathy: Secondary | ICD-10-CM | POA: Diagnosis not present

## 2024-07-03 DIAGNOSIS — F3341 Major depressive disorder, recurrent, in partial remission: Secondary | ICD-10-CM | POA: Diagnosis not present

## 2024-07-03 DIAGNOSIS — E669 Obesity, unspecified: Secondary | ICD-10-CM | POA: Diagnosis not present

## 2024-07-03 DIAGNOSIS — E1169 Type 2 diabetes mellitus with other specified complication: Secondary | ICD-10-CM | POA: Diagnosis not present

## 2024-07-03 DIAGNOSIS — E1122 Type 2 diabetes mellitus with diabetic chronic kidney disease: Secondary | ICD-10-CM | POA: Diagnosis not present

## 2024-07-03 DIAGNOSIS — E039 Hypothyroidism, unspecified: Secondary | ICD-10-CM | POA: Diagnosis not present

## 2024-07-03 DIAGNOSIS — G63 Polyneuropathy in diseases classified elsewhere: Secondary | ICD-10-CM | POA: Diagnosis not present

## 2024-07-03 DIAGNOSIS — E559 Vitamin D deficiency, unspecified: Secondary | ICD-10-CM | POA: Diagnosis not present

## 2024-07-03 DIAGNOSIS — I509 Heart failure, unspecified: Secondary | ICD-10-CM | POA: Diagnosis not present

## 2024-07-03 DIAGNOSIS — I130 Hypertensive heart and chronic kidney disease with heart failure and stage 1 through stage 4 chronic kidney disease, or unspecified chronic kidney disease: Secondary | ICD-10-CM | POA: Diagnosis not present

## 2024-07-03 NOTE — Progress Notes (Signed)
 Cardiology Office Note:  .   Date:  07/07/2024  ID:  Shannon Kidd, Shannon Kidd 1946-02-05, MRN [id number] PCP: Katina Pfeiffer, PA-C  [facility] Providers Cardiologist:  Redell Shallow, MD    History of Present Illness: .   Shannon Kidd is a 78 y.o. female  with history of TAVR, diastolic CHF,Cardiac catheterization August 2019 showed calcified coronaries but only mild nonobstructive disease. There was severe aortic stenosis and moderate pulmonary hypertension felt secondary to elevated left heart pressures. Had TAVR October 2019. Carotid Dopplers October 2022 showed 1 to 39% bilateral stenosis. Echocardiogram April 2024 showed normal LV function, mild left ventricular hypertrophy, grade 1 diastolic dysfunction, status post TAVR with no aortic insufficiency and max velocity 2.5 m/s  Patient added onto my schedule due to 11 lb weight gain, LE edema, DOE due to diet and late night eating. Dr. Shallow added Lasix  20 mg daily. She is here with her son. Swelling down some but weight unchanged. Still short of breath but improving. Eating extra salt-canned foods etc Also eating a lot of cake. . BP up today but she has a home monitor through [place] and it's been stable.   ROS:    Studies Reviewed: SABRA    EKG Interpretation Date/Time:  Tuesday July 07 2024 09:00:00 EDT Ventricular Rate:  47 PR Interval:  138 QRS Duration:  88 QT Interval:  472 QTC Calculation: 417 R Axis:   -22  Text Interpretation: Sinus bradycardia Cannot rule out Anterior infarct (cited on or before 29-Jul-2018) When compared with ECG of 30-Jul-2018 06:47, No significant change was found Confirmed by Parthenia Klinefelter [phone]) on 07/07/2024 9:05:12 AM    Prior CV Studies:    Echo 01/2023 IMPRESSIONS     1. Left ventricular ejection fraction, by estimation, is 60 to 65%. Left  ventricular ejection fraction by 3D volume is 61 %. The left ventricle has  normal function. The left ventricle has no regional wall motion   abnormalities. There is mild concentric  left ventricular hypertrophy. Left ventricular diastolic parameters are  consistent with Grade I diastolic dysfunction (impaired relaxation). The  average left ventricular global longitudinal strain is -19.0 %. The global  longitudinal strain is normal.   2. Right ventricular systolic function is normal. The right ventricular  size is normal.   3. No evidence of mitral valve regurgitation. Severe mitral annular  calcification.   4. S/p TAVR 26 mm Edwards Sapien 3. V max 2.5 m/s. DI 0.41. No PVL.  Normal prosthesis. Aortic valve regurgitation is not visualized.   5. The inferior vena cava is normal in size with greater than 50%  respiratory variability, suggesting right atrial pressure of 3 mmHg.   Comparison(s): No significant change from prior study.    Risk Assessment/Calculations:     HYPERTENSION CONTROL Vitals:   07/07/24 0854 07/07/24 0915  BP: (!) 170/70 (!) 160/70    The patient's blood pressure is elevated above target today.  In order to address the patient's elevated BP: Blood pressure will be monitored at home to determine if medication changes need to be made.          Physical Exam:   VS:  BP (!) 160/70   Pulse (!) 47   Ht 5' (1.524 m)   Wt 184 lb 3.2 oz (83.6 kg)   SpO2 95%   BMI 35.97 kg/m    Orhtostatics: No data found. Wt Readings from Last 3 Encounters:  07/07/24 184 lb 3.2 oz (83.6 kg)  03/19/24 168 lb 12.8 oz (76.6 kg)  12/12/23 171 lb (77.6 kg)    GEN: Well nourished, well developed in no acute distress NECK: No JVD; bilateral carotid bruits CARDIAC:  RRR, no murmurs, rubs, gallops RESPIRATORY:  Clear to auscultation without rales, wheezing or rhonchi  ABDOMEN: Soft, non-tender, non-distended EXTREMITIES:  plus 1 edema; No deformity   ASSESSMENT AND PLAN: .    Acute on chronic diastolic CHF-DOE/LE edema/11 lb weight gain due to diet. Edema down but weight up on her scales. Overall improving on low  dose lasix . Reluctant to increase with CKD. Will check labs today, update echo, continue lasix  20 mg daily, 2 gm sodium diet  Sinus bradycardia- HR 47 asymptomatic but may be contributing to DOE. Will check 2 week zio  S/P TAVR-SBE prophylaxis-update echo  HTN-BP high today but monitored daily through [facility] and has been stable. Low salt idet  HLD-not fasting today. Continue crestor   Mild carotid disease-recheck carotid dopplers        Dispo: f/u Dr. Pietro 2-3 months  Signed, Olivia Pavy, PA-C

## 2024-07-07 ENCOUNTER — Encounter: Payer: Self-pay | Admitting: Physician Assistant

## 2024-07-07 ENCOUNTER — Ambulatory Visit

## 2024-07-07 ENCOUNTER — Ambulatory Visit: Attending: Physician Assistant | Admitting: Physician Assistant

## 2024-07-07 VITALS — BP 160/70 | HR 47 | Ht 60.0 in | Wt 184.2 lb

## 2024-07-07 DIAGNOSIS — I5032 Chronic diastolic (congestive) heart failure: Secondary | ICD-10-CM

## 2024-07-07 DIAGNOSIS — I5030 Unspecified diastolic (congestive) heart failure: Secondary | ICD-10-CM

## 2024-07-07 DIAGNOSIS — Z952 Presence of prosthetic heart valve: Secondary | ICD-10-CM | POA: Diagnosis not present

## 2024-07-07 DIAGNOSIS — R0989 Other specified symptoms and signs involving the circulatory and respiratory systems: Secondary | ICD-10-CM

## 2024-07-07 DIAGNOSIS — R001 Bradycardia, unspecified: Secondary | ICD-10-CM

## 2024-07-07 DIAGNOSIS — I10 Essential (primary) hypertension: Secondary | ICD-10-CM

## 2024-07-07 DIAGNOSIS — I350 Nonrheumatic aortic (valve) stenosis: Secondary | ICD-10-CM | POA: Diagnosis not present

## 2024-07-07 DIAGNOSIS — E7849 Other hyperlipidemia: Secondary | ICD-10-CM | POA: Diagnosis not present

## 2024-07-07 DIAGNOSIS — N1831 Chronic kidney disease, stage 3a: Secondary | ICD-10-CM | POA: Diagnosis not present

## 2024-07-07 DIAGNOSIS — E7800 Pure hypercholesterolemia, unspecified: Secondary | ICD-10-CM | POA: Diagnosis not present

## 2024-07-07 DIAGNOSIS — N183 Chronic kidney disease, stage 3 unspecified: Secondary | ICD-10-CM | POA: Diagnosis not present

## 2024-07-07 DIAGNOSIS — I6523 Occlusion and stenosis of bilateral carotid arteries: Secondary | ICD-10-CM | POA: Diagnosis not present

## 2024-07-07 NOTE — Patient Instructions (Signed)
 Medication Instructions:  NO CHANGES  Lab Work: CMET AND BNP TO BE DONE TODAY.  Testing/Procedures: GEOFFRY HEWS- Long Term Monitor Instructions  Your physician has requested you wear a ZIO patch monitor for 14 days.  This is a single patch monitor. [employer] supplies one patch monitor per enrollment. Additional stickers are not available. Please do not apply patch if you will be having a Nuclear Stress Test,  Echocardiogram, Cardiac CT, MRI, or Chest Xray during the period you would be wearing the  monitor. The patch cannot be worn during these tests. You cannot remove and re-apply the  ZIO XT patch monitor.  Your ZIO patch monitor will be mailed 3 day USPS to your address on file. It may take 3-5 days  to receive your monitor after you have been enrolled.  Once you have received your monitor, please review the enclosed instructions. Your monitor  has already been registered assigning a specific monitor serial # to you.  Billing and Patient Assistance Program Information  We have supplied [employer] with any of your insurance information on file for billing purposes. [employer] offers a sliding scale Patient Assistance Program for patients that do not have  insurance, or whose insurance does not completely cover the cost of the ZIO monitor.  You must apply for the Patient Assistance Program to qualify for this discounted rate.  To apply, please call [employer] at [phone], select option 4, select option 2, ask to apply for  Patient Assistance Program. Meredeth will ask your household income, and how many people  are in your household. They will quote your out-of-pocket cost based on that information.  [employer] will also be able to set up a 48-month, interest-free payment plan if needed.  Applying the monitor   Shave hair from upper left chest.  Hold abrader disc by orange tab. Rub abrader in 40 strokes over the upper left chest as  indicated in your monitor instructions.  Clean area with 4  enclosed alcohol pads. Let dry.  Apply patch as indicated in monitor instructions. Patch will be placed under collarbone on left  side of chest with arrow pointing upward.  Rub patch adhesive wings for 2 minutes. Remove white label marked 1. Remove the white  label marked 2. Rub patch adhesive wings for 2 additional minutes.  While looking in a mirror, press and release button in center of patch. A small green light will  flash 3-4 times. This will be your only indicator that the monitor has been turned on.  Do not shower for the first 24 hours. You may shower after the first 24 hours.  Press the button if you feel a symptom. You will hear a small click. Record Date, Time and  Symptom in the Patient Logbook.  When you are ready to remove the patch, follow instructions on the last 2 pages of Patient  Logbook. Stick patch monitor onto the last page of Patient Logbook.  Place Patient Logbook in the blue and white box. Use locking tab on box and tape box closed  securely. The blue and white box has prepaid postage on it. Please place it in the mailbox as  soon as possible. Your physician should have your test results approximately 7 days after the  monitor has been mailed back to [facility].  Call [facility] Customer Care at [phone] if you have questions regarding  your ZIO XT patch monitor. Call them immediately if you see an orange light blinking on your  monitor.  If your  monitor falls off in less than 4 days, contact our Monitor department at [phone].  If your monitor becomes loose or falls off after 4 days call [employer] at [phone] for  suggestions on securing your monitor   Your physician has requested that you have an echocardiogram. Echocardiography is a painless test that uses sound waves to create images of your heart. It provides your doctor with information about the size and shape of your heart and how well your heart's chambers and valves are working.  This procedure takes approximately one hour. There are no restrictions for this procedure. Please do NOT wear cologne, perfume, aftershave, or lotions (deodorant is allowed). Please arrive 15 minutes prior to your appointment time.  Please note: We ask at that you not bring children with you during ultrasound (echo/ vascular) testing. Due to room size and safety concerns, children are not allowed in the ultrasound rooms during exams. Our front office staff cannot provide observation of children in our lobby area while testing is being conducted. An adult accompanying a patient to their appointment will only be allowed in the ultrasound room at the discretion of the ultrasound technician under special circumstances. We apologize for any inconvenience.   Your physician has requested that you have a carotid duplex. This test is an ultrasound of the carotid arteries in your neck. It looks at blood flow through these arteries that supply the brain with blood. Allow one hour for this exam. There are no restrictions or special instructions.    Follow-Up: At [facility], you and your health needs are our priority.  As part of our continuing mission to provide you with exceptional heart care, our providers are all part of one team.  This team includes your primary Cardiologist (physician) and Advanced Practice Providers or APPs (Physician Assistants and Nurse Practitioners) who all work together to provide you with the care you need, when you need it.  Your next appointment:   2 -3 MONTHS  Provider:   Redell Shallow, MD    We recommend signing up for the patient portal called MyChart.  Sign up information is provided on this After Visit Summary.  MyChart is used to connect with patients for Virtual Visits (Telemedicine).  Patients are able to view lab/test results, encounter notes, upcoming appointments, etc.  Non-urgent messages can be sent to your provider as well.   To learn more about  what you can do with MyChart, go to [email or web address].   Other Instructions: 2 GRAM LOW SODIUM DIET LITERATURE GIVEN TODAY.

## 2024-07-07 NOTE — Progress Notes (Unsigned)
 Applied a 14 day Zio XT monitor to patient in the office  [place] to read

## 2024-07-08 ENCOUNTER — Ambulatory Visit: Payer: Self-pay | Admitting: Physician Assistant

## 2024-07-08 DIAGNOSIS — Z79899 Other long term (current) drug therapy: Secondary | ICD-10-CM

## 2024-07-08 LAB — COMPREHENSIVE METABOLIC PANEL WITH GFR
ALT: 13 [IU]/L (ref 0–32)
AST: 22 [IU]/L (ref 0–40)
Albumin: 3.9 g/dL (ref 3.8–4.8)
Alkaline Phosphatase: 107 [IU]/L (ref 49–135)
BUN/Creatinine Ratio: 19 (ref 12–28)
BUN: 34 mg/dL — ABNORMAL HIGH (ref 8–27)
Bilirubin Total: 0.2 mg/dL (ref 0.0–1.2)
CO2: 22 mmol/L (ref 20–29)
Calcium: 9.2 mg/dL (ref 8.7–10.3)
Chloride: 103 mmol/L (ref 96–106)
Creatinine, Ser: 1.79 mg/dL — ABNORMAL HIGH (ref 0.57–1.00)
Globulin, Total: 3.1 g/dL (ref 1.5–4.5)
Glucose: 154 mg/dL — ABNORMAL HIGH (ref 70–99)
Potassium: 4.7 mmol/L (ref 3.5–5.2)
Sodium: 139 mmol/L (ref 134–144)
Total Protein: 7 g/dL (ref 6.0–8.5)
eGFR: 29 mL/min/{1.73_m2} — ABNORMAL LOW

## 2024-07-08 LAB — PRO B NATRIURETIC PEPTIDE: NT-Pro BNP: 672 pg/mL (ref 0–738)

## 2024-07-09 ENCOUNTER — Other Ambulatory Visit (HOSPITAL_COMMUNITY): Payer: Self-pay

## 2024-07-09 ENCOUNTER — Other Ambulatory Visit: Payer: Self-pay

## 2024-07-09 DIAGNOSIS — F418 Other specified anxiety disorders: Secondary | ICD-10-CM | POA: Diagnosis not present

## 2024-07-09 DIAGNOSIS — Z23 Encounter for immunization: Secondary | ICD-10-CM | POA: Diagnosis not present

## 2024-07-09 DIAGNOSIS — N949 Unspecified condition associated with female genital organs and menstrual cycle: Secondary | ICD-10-CM | POA: Diagnosis not present

## 2024-07-09 DIAGNOSIS — Z6836 Body mass index (BMI) 36.0-36.9, adult: Secondary | ICD-10-CM | POA: Diagnosis not present

## 2024-07-09 DIAGNOSIS — M8580 Other specified disorders of bone density and structure, unspecified site: Secondary | ICD-10-CM | POA: Diagnosis not present

## 2024-07-09 DIAGNOSIS — N183 Chronic kidney disease, stage 3 unspecified: Secondary | ICD-10-CM | POA: Diagnosis not present

## 2024-07-09 DIAGNOSIS — E1165 Type 2 diabetes mellitus with hyperglycemia: Secondary | ICD-10-CM | POA: Diagnosis not present

## 2024-07-09 DIAGNOSIS — R251 Tremor, unspecified: Secondary | ICD-10-CM | POA: Diagnosis not present

## 2024-07-09 DIAGNOSIS — E7800 Pure hypercholesterolemia, unspecified: Secondary | ICD-10-CM | POA: Diagnosis not present

## 2024-07-09 DIAGNOSIS — G629 Polyneuropathy, unspecified: Secondary | ICD-10-CM | POA: Diagnosis not present

## 2024-07-09 DIAGNOSIS — I10 Essential (primary) hypertension: Secondary | ICD-10-CM | POA: Diagnosis not present

## 2024-07-09 DIAGNOSIS — E1122 Type 2 diabetes mellitus with diabetic chronic kidney disease: Secondary | ICD-10-CM | POA: Diagnosis not present

## 2024-07-09 MED ORDER — PROPRANOLOL HCL 40 MG PO TABS
40.0000 mg | ORAL_TABLET | Freq: Every day | ORAL | 1 refills | Status: DC
  Filled 2024-07-09 – 2024-07-13 (×2): qty 90, 90d supply, fill #0

## 2024-07-09 MED ORDER — PRAMIPEXOLE DIHYDROCHLORIDE 0.25 MG PO TABS
0.2500 mg | ORAL_TABLET | Freq: Every day | ORAL | 0 refills | Status: DC
  Filled 2024-07-09: qty 90, 90d supply, fill #0

## 2024-07-13 ENCOUNTER — Other Ambulatory Visit: Payer: Self-pay

## 2024-07-13 ENCOUNTER — Other Ambulatory Visit (HOSPITAL_COMMUNITY): Payer: Self-pay

## 2024-07-17 DIAGNOSIS — Z79899 Other long term (current) drug therapy: Secondary | ICD-10-CM | POA: Diagnosis not present

## 2024-07-18 DIAGNOSIS — N183 Chronic kidney disease, stage 3 unspecified: Secondary | ICD-10-CM | POA: Diagnosis not present

## 2024-07-18 DIAGNOSIS — N1831 Chronic kidney disease, stage 3a: Secondary | ICD-10-CM | POA: Diagnosis not present

## 2024-07-18 DIAGNOSIS — I10 Essential (primary) hypertension: Secondary | ICD-10-CM | POA: Diagnosis not present

## 2024-07-18 DIAGNOSIS — I350 Nonrheumatic aortic (valve) stenosis: Secondary | ICD-10-CM | POA: Diagnosis not present

## 2024-07-18 LAB — BASIC METABOLIC PANEL WITH GFR
BUN/Creatinine Ratio: 19 (ref 12–28)
BUN: 39 mg/dL — ABNORMAL HIGH (ref 8–27)
CO2: 16 mmol/L — ABNORMAL LOW (ref 20–29)
Calcium: 8.6 mg/dL — ABNORMAL LOW (ref 8.7–10.3)
Chloride: 100 mmol/L (ref 96–106)
Creatinine, Ser: 2.08 mg/dL — ABNORMAL HIGH (ref 0.57–1.00)
Glucose: 75 mg/dL (ref 70–99)
Potassium: 5 mmol/L (ref 3.5–5.2)
Sodium: 136 mmol/L (ref 134–144)
eGFR: 24 mL/min/{1.73_m2} — ABNORMAL LOW

## 2024-07-20 ENCOUNTER — Other Ambulatory Visit

## 2024-07-21 ENCOUNTER — Telehealth: Payer: Self-pay

## 2024-07-21 DIAGNOSIS — I890 Lymphedema, not elsewhere classified: Secondary | ICD-10-CM | POA: Diagnosis not present

## 2024-07-21 DIAGNOSIS — R0602 Shortness of breath: Secondary | ICD-10-CM | POA: Diagnosis not present

## 2024-07-21 DIAGNOSIS — Z952 Presence of prosthetic heart valve: Secondary | ICD-10-CM | POA: Diagnosis not present

## 2024-07-21 DIAGNOSIS — I10 Essential (primary) hypertension: Secondary | ICD-10-CM | POA: Diagnosis not present

## 2024-07-21 DIAGNOSIS — E860 Dehydration: Secondary | ICD-10-CM | POA: Diagnosis not present

## 2024-07-21 DIAGNOSIS — J189 Pneumonia, unspecified organism: Secondary | ICD-10-CM | POA: Diagnosis not present

## 2024-07-21 DIAGNOSIS — R001 Bradycardia, unspecified: Secondary | ICD-10-CM | POA: Diagnosis not present

## 2024-07-21 DIAGNOSIS — E11620 Type 2 diabetes mellitus with diabetic dermatitis: Secondary | ICD-10-CM | POA: Diagnosis not present

## 2024-07-21 DIAGNOSIS — I480 Paroxysmal atrial fibrillation: Secondary | ICD-10-CM | POA: Diagnosis not present

## 2024-07-21 DIAGNOSIS — R635 Abnormal weight gain: Secondary | ICD-10-CM | POA: Diagnosis not present

## 2024-07-21 DIAGNOSIS — R911 Solitary pulmonary nodule: Secondary | ICD-10-CM | POA: Diagnosis not present

## 2024-07-21 DIAGNOSIS — J90 Pleural effusion, not elsewhere classified: Secondary | ICD-10-CM | POA: Diagnosis not present

## 2024-07-21 DIAGNOSIS — I5032 Chronic diastolic (congestive) heart failure: Secondary | ICD-10-CM | POA: Diagnosis not present

## 2024-07-21 DIAGNOSIS — E785 Hyperlipidemia, unspecified: Secondary | ICD-10-CM | POA: Diagnosis not present

## 2024-07-21 DIAGNOSIS — N1832 Chronic kidney disease, stage 3b: Secondary | ICD-10-CM | POA: Diagnosis not present

## 2024-07-21 NOTE — Telephone Encounter (Signed)
   Received call that patient is reporting a 30lb weight gain and not feeling well.   Audibly hearing coughing over the phone during conversation.   Patient shared after her last cardiology appointment she has continued to gain weight and feel worse. About a week after that appointment she developed a cold, and since this illness she has had a progressive cough with shortness of breath and general malaise. Additionally she reports she has noticed a 30lb weight gain over the last several weeks. I advised patient she should present to the closest hospital, which for her is in [place]. Patient reported her son was already on the way to her home and will take her to their ED.   Caller verbalized understanding and was grateful for the call back.  Leontine LOISE Salen, PA-C 07/21/2024, 7:25 AM

## 2024-07-22 DIAGNOSIS — Z8249 Family history of ischemic heart disease and other diseases of the circulatory system: Secondary | ICD-10-CM | POA: Diagnosis not present

## 2024-07-22 DIAGNOSIS — F419 Anxiety disorder, unspecified: Secondary | ICD-10-CM | POA: Diagnosis not present

## 2024-07-22 DIAGNOSIS — N1832 Chronic kidney disease, stage 3b: Secondary | ICD-10-CM | POA: Diagnosis not present

## 2024-07-22 DIAGNOSIS — J1282 Pneumonia due to coronavirus disease 2019: Secondary | ICD-10-CM | POA: Diagnosis not present

## 2024-07-22 DIAGNOSIS — E1122 Type 2 diabetes mellitus with diabetic chronic kidney disease: Secondary | ICD-10-CM | POA: Diagnosis not present

## 2024-07-22 DIAGNOSIS — E785 Hyperlipidemia, unspecified: Secondary | ICD-10-CM | POA: Diagnosis not present

## 2024-07-22 DIAGNOSIS — Z79899 Other long term (current) drug therapy: Secondary | ICD-10-CM | POA: Diagnosis not present

## 2024-07-22 DIAGNOSIS — R0602 Shortness of breath: Secondary | ICD-10-CM | POA: Diagnosis not present

## 2024-07-22 DIAGNOSIS — R918 Other nonspecific abnormal finding of lung field: Secondary | ICD-10-CM | POA: Diagnosis not present

## 2024-07-22 DIAGNOSIS — J9601 Acute respiratory failure with hypoxia: Secondary | ICD-10-CM | POA: Diagnosis not present

## 2024-07-22 DIAGNOSIS — I480 Paroxysmal atrial fibrillation: Secondary | ICD-10-CM | POA: Diagnosis not present

## 2024-07-22 DIAGNOSIS — R635 Abnormal weight gain: Secondary | ICD-10-CM | POA: Diagnosis not present

## 2024-07-22 DIAGNOSIS — Z7901 Long term (current) use of anticoagulants: Secondary | ICD-10-CM | POA: Diagnosis not present

## 2024-07-22 DIAGNOSIS — I5033 Acute on chronic diastolic (congestive) heart failure: Secondary | ICD-10-CM | POA: Diagnosis not present

## 2024-07-22 DIAGNOSIS — E871 Hypo-osmolality and hyponatremia: Secondary | ICD-10-CM | POA: Diagnosis not present

## 2024-07-22 DIAGNOSIS — I130 Hypertensive heart and chronic kidney disease with heart failure and stage 1 through stage 4 chronic kidney disease, or unspecified chronic kidney disease: Secondary | ICD-10-CM | POA: Diagnosis not present

## 2024-07-22 DIAGNOSIS — Z952 Presence of prosthetic heart valve: Secondary | ICD-10-CM | POA: Diagnosis not present

## 2024-07-22 DIAGNOSIS — Z808 Family history of malignant neoplasm of other organs or systems: Secondary | ICD-10-CM | POA: Diagnosis not present

## 2024-07-22 DIAGNOSIS — U071 COVID-19: Secondary | ICD-10-CM | POA: Diagnosis not present

## 2024-07-22 DIAGNOSIS — I3481 Nonrheumatic mitral (valve) annulus calcification: Secondary | ICD-10-CM | POA: Diagnosis not present

## 2024-07-22 DIAGNOSIS — J90 Pleural effusion, not elsewhere classified: Secondary | ICD-10-CM | POA: Diagnosis not present

## 2024-07-22 DIAGNOSIS — R001 Bradycardia, unspecified: Secondary | ICD-10-CM | POA: Diagnosis not present

## 2024-07-22 DIAGNOSIS — E860 Dehydration: Secondary | ICD-10-CM | POA: Diagnosis not present

## 2024-07-22 DIAGNOSIS — R911 Solitary pulmonary nodule: Secondary | ICD-10-CM | POA: Diagnosis not present

## 2024-07-22 DIAGNOSIS — Z800 Family history of malignant neoplasm of digestive organs: Secondary | ICD-10-CM | POA: Diagnosis not present

## 2024-07-22 DIAGNOSIS — J189 Pneumonia, unspecified organism: Secondary | ICD-10-CM | POA: Diagnosis not present

## 2024-07-22 DIAGNOSIS — I890 Lymphedema, not elsewhere classified: Secondary | ICD-10-CM | POA: Diagnosis not present

## 2024-07-22 DIAGNOSIS — Z7984 Long term (current) use of oral hypoglycemic drugs: Secondary | ICD-10-CM | POA: Diagnosis not present

## 2024-07-22 DIAGNOSIS — E11620 Type 2 diabetes mellitus with diabetic dermatitis: Secondary | ICD-10-CM | POA: Diagnosis not present

## 2024-07-23 ENCOUNTER — Other Ambulatory Visit (HOSPITAL_COMMUNITY): Payer: Self-pay

## 2024-07-23 ENCOUNTER — Ambulatory Visit: Admitting: Endocrinology

## 2024-07-23 ENCOUNTER — Other Ambulatory Visit: Payer: Self-pay

## 2024-07-23 MED ORDER — CEFDINIR 300 MG PO CAPS
300.0000 mg | ORAL_CAPSULE | Freq: Every day | ORAL | 0 refills | Status: DC
  Filled 2024-07-23 (×2): qty 7, 7d supply, fill #0

## 2024-07-23 MED ORDER — FUROSEMIDE 20 MG PO TABS
20.0000 mg | ORAL_TABLET | Freq: Every day | ORAL | 0 refills | Status: DC
  Filled 2024-07-23: qty 30, 30d supply, fill #0

## 2024-07-23 MED ORDER — GLIPIZIDE 5 MG PO TABS
5.0000 mg | ORAL_TABLET | Freq: Every day | ORAL | 0 refills | Status: DC
  Filled 2024-07-23 (×2): qty 30, 30d supply, fill #0

## 2024-07-23 MED ORDER — ALBUTEROL SULFATE HFA 108 (90 BASE) MCG/ACT IN AERS
2.0000 | INHALATION_SPRAY | Freq: Four times a day (QID) | RESPIRATORY_TRACT | 0 refills | Status: AC | PRN
  Filled 2024-07-23: qty 6.7, 16d supply, fill #0
  Filled 2024-07-23: qty 6.7, 25d supply, fill #0

## 2024-07-23 MED ORDER — AMLODIPINE BESYLATE 10 MG PO TABS
10.0000 mg | ORAL_TABLET | Freq: Every day | ORAL | 0 refills | Status: DC
  Filled 2024-07-23 (×2): qty 30, 30d supply, fill #0

## 2024-07-23 MED ORDER — GLIPIZIDE 2.5 MG PO TABS
5.0000 mg | ORAL_TABLET | Freq: Every day | ORAL | 0 refills | Status: DC
  Filled 2024-07-23: qty 30, 15d supply, fill #0

## 2024-07-23 MED ORDER — DEXAMETHASONE 2 MG PO TABS
ORAL_TABLET | ORAL | 0 refills | Status: AC
  Filled 2024-07-23 (×2): qty 35, 17d supply, fill #0

## 2024-07-23 MED ORDER — AZITHROMYCIN 250 MG PO TABS
250.0000 mg | ORAL_TABLET | Freq: Every day | ORAL | 0 refills | Status: DC
  Filled 2024-07-23 (×2): qty 3, 3d supply, fill #0

## 2024-07-23 NOTE — Discharge Summary (Addendum)
 [facility] Discharge Summary  PCP: KATINA PFEIFFER, PA Discharge Details   Admit date:         07/21/2024 Discharge date:        07/23/2024  Hospital LOS:    1 days DIscharge Disposition:  Home  Active Hospital Problems   Diagnosis Date Noted POA   *Shortness of breath 07/21/2024 Yes   Nodule of lower lobe of left lung 07/21/2024 Yes   Pleural effusion, bilateral 07/21/2024 Yes   Bradycardia 07/21/2024 Yes   Stage 3b chronic kidney disease (*) 07/21/2024 Yes   Dehydration with hyponatremia 07/21/2024 Yes   Paroxysmal atrial fibrillation (*) 10/30/2023 Yes   Type 2 diabetes mellitus with dermatitis 08/16/2023 Yes   Lymphedema of both lower extremities 04/03/2022 Yes   Chronic diastolic congestive heart failure (*) 02/25/2019 Yes   S/P TAVR (transcatheter aortic valve replacement) 07/29/2018 Not Applicable   HLD (hyperlipidemia) 05/28/2014 Yes   Essential hypertension, benign 05/28/2014 Yes    Resolved Hospital Problems  No resolved problems to display.      Current Discharge Medication List     START taking these medications      Details  albuterol  sulfate HFA 108 (90 Base) MCG/ACT inhaler Commonly known as: PROVENTIL ,VENTOLIN ,PROAIR   Inhale two puffs into the lungs every 6 (six) hours as needed for Wheezing or Shortness of Breath. Quantity: 6.7 g   azithromycin  250 mg tablet Commonly known as: ZITHROMAX  Start taking on: July 24, 2024  Take one tablet (250 mg dose) by mouth daily for 3 days. Indication: Community Acquired Pneumonia Quantity: 3 tablet   cefdinir  300 mg capsule Commonly known as: OMNICEF   Take one capsule (300 mg dose) by mouth daily for 7 days. Dose adjusted secondary to renal functioning. Quantity: 7 capsule   dexamethasone  2 mg tablet Commonly known as: DECADRON  Start taking on: July 23, 2024  Take three tablets (6 mg dose) by mouth daily for 8 days, THEN two tablets (4 mg dose) daily for 3  days, THEN one tablet (2 mg dose) daily for 3 days, THEN one half tablet (1 mg dose) daily for 3 days. Quantity: 35 tablet       CONTINUE these medications which have CHANGED      Details  amLODIPine  besylate 10 mg tablet Commonly known as: NORVASC  What changed:  medication strength how much to take when to take this  Take one tablet (10 mg dose) by mouth daily. Quantity: 30 tablet   glipiZIDE  2.5 MG Tabs What changed: how much to take  Take 5 mg by mouth 1 (one) time each day with breakfast for 30 days. Quantity: 30 tablet   pantoprazole  sodium 40 mg tablet Commonly known as: PROTONIX  What changed: when to take this  Take one tablet (40 mg dose) by mouth daily. Quantity: 90 tablet       CONTINUE these medications which have NOT CHANGED      Details  cholecalciferol 1,000 units (25 mcg) tablet Commonly known as: VITAMIN D-3  Take one tablet (1,000 Units dose) by mouth daily.   furosemide  20 mg tablet Commonly known as: LASIX   Take one tablet (20 mg dose) by mouth daily. Quantity: 30 tablet   losartan  potassium 50 mg tablet Commonly known as: COZAAR   Take one tablet (50 mg dose) by mouth every morning.   pramipexole  dihydrochloride 0.25 mg tablet Commonly known as: MIRAPEX   Take one tablet (0.25 mg dose) by mouth at bedtime.   rosuvastatin  calcium  20 mg tablet Commonly  known as: CRESTOR   Take one tablet (20 mg dose) by mouth at bedtime.   sertraline  50 mg tablet Commonly known as: ZOLOFT   Take one and a half tablets (75 mg dose) by mouth at bedtime.   vitamin B-12 500 mcg tablet Commonly known as: CYANOCOBALAMIN   Take one tablet (500 mcg dose) by mouth every morning.   vitamin C 1000 MG tablet  Take one tablet (1,000 mg dose) by mouth daily.      * You might also be taking other medications not listed above. If you have questions about any of your other medications, talk to the person who prescribed them or your Primary Care Provider.           STOP taking these medications    metFORMIN  ER 500 mg 24 hr tablet Commonly known as: GLUCOPHAGE -XR   MULTI-VITAMIN tablet   ondansetron  4 mg disintegrating tablet Commonly known as: ZOFRAN -ODT   oxybutynin 5 MG tablet Commonly known as: DITROPAN   propranolol  HCl 40 mg tablet Commonly known as: INDERAL    tamsulosin 0.4 mg Caps Commonly known as: FLOMAX        Reason for Medication Changes: low GFR, Bradycardia, intolerance to medications  Hospital Course  Physicians involved in care during this hospitalization Attending Provider: Aloha Theo Furth, MD Attending Provider: Charleen DELENA Shutter, DO Attending Provider: Arlin Minion, MD Admitting Provider: Charleen DELENA Shutter, DO Consulting Physician: Charleen DELENA Shutter, DO Consulting Physician: Redell DELENA Montenegro, MD Consulting Physician: Cathlean FORBES Lanius, MD Consulting Physician: Arlin Minion, MD  Indication for Admission: Acute hypoxic respiratory failure, COVID 75 Pneumonia        78 year old female with past medical history of heart failure with preserved ejection fraction, chronic kidney disease, lung nodule, type 2 diabetes mellitus presented to ED with chief complaint of shortness of breath and weight gain.   She was treated for acute hypoxic respiratory failure secondary to COVID-19 infection, community-acquired pneumonia,B/L pleural effusion, heart failure with preserved ejection fraction along with chronic kidney disease and bradycardia.  CT chest was done in the ED which was suggestive of bilateral pleural effusion, right greater than left and suspicious enlarging left lower lobe nodule which had increased from 1.2 cm from March 2 1.6 cm currently.  Echo was suggestive of ejection fraction of 55 to 60%. IR Attempted fluid collection and analysis without success.    Patient came back positive for COVID-19 and her inflammatory markers were elevated,.  She was started on prophylactic pneumonia coverage along with  steroids, she was given IV ceftriaxone and oral azithromycin  along with oral Lasix .  She was also started on Decadron  6 mg once daily with tapering dose.  With conservative management, patient's hypoxia improved from 2 L nasal cannula to room air.  She passed 6-minute walk test and is getting discharged with oral antibiotics and oral Lasix  with medication adjustment.  Patient follows up with nephrologist outpatient for her chronic kidney disease.  Since her GFR is around 25, Her metformin  was discontinued as it is contraindicated and her home dose of glipizide  was increased from 2.5 to 5 once daily.  Patient had severe bradycardia secondary to home dose of propranolol  60mg  which she was on for tremors, it was initially reduced to 30 mg however patient again had bradycardia and intolerance for which it was discontinued.  Her amlodipine  was increased to 10 mg once daily afterwards.  She is discharged with oral cefdinir  for 7 days, oral azithromycin  for 3 days, Lasix  20 once daily, Decadron   with tapering dose and adjustment of her home medication as per renal dosing.  Patient will need PET/CT or biopsy as outpatientFor enlarging left lower lobe lung mass, pulmonology has been consulted and has been discussed with patient and patient's daughter at bedside.  At the time of discharge, patient is doing good, vitals are stable, home medications have been resumed and reconciled.  Patient will have to call to schedule an appointment and follow up with PCP within 1 week of discharge.  Please call to schedule an appointment and follow up with Pulmonology,Nephrology  and Cardiology as outpatient,referral has been made. Will benefit from beta blockers once her bradycardia improves.   Please Call [phone] incase of emergency and seek medical advice immediately in case of worsening of symptoms.  Bedside Procedures   No orders found     [facility] Care   Discharge Procedure Orders  Follow-up with Primary Care  Physician  Standing Status: Future  Referral Priority: Routine Referral Type: Consultation  Referral Reason: Evaluate and Return  Number of Visits Requested: 1 Expiration Date: 01/18/25   Ambulatory referral to Pulmonology  Standing Status: Future  Referral Priority: Urgent Referral Type: Consultation  Referral Reason: Evaluate and Return  Requested Specialty: Pulmonary Diseases  Number of Visits Requested: 1 Expiration Date: 01/18/25   Ambulatory referral to Nephrology  Standing Status: Future  Referral Priority: Routine Referral Type: Consultation  Referral Reason: Evaluate and Return  Requested Specialty: Nephrology  Number of Visits Requested: 1 Expiration Date: 01/18/25   Cardiac diet (heart healthy)   Activity as tolerated   Discharge instructions  Order Comments: Propranolol  60 mg that you are taking at home has been discontinued totally for your slow heart rate. Your amlodipine  5 mg has been increased to 10 mg for your blood pressure.   Continue taking Lasix  20 mg once daily.  Repeat kidney function test within 5 to 7 days of discharge at your primary care office since you already have chronic kidney disease,Will have to monitor water pills depending on your kidney numbers.  Watch salt intake and fluid intake, keep it less than 2 L in a day.  Follow-up with pulmonology as outpatient and primary care physician.    Your metformin  is discontinued due to very low kidney function, I have increased your glipizide  to 5 mg once daily, keep checking your blood sugar at home as you have been started on steroids.  Keep watching diet. Seek medical attention immediately in case of worsening of symptoms    Unresulted Labs     Order Current Status   Culture, Blood Blood Vein Preliminary result   Culture, Blood Blood Vein Preliminary result           Code Status:   Full Code   Time spent in discharge process:  35 minutes  This note was dictated with voice recognition  software. Similar sounding words can inadvertently be transcribed and may not be corrected upon review  Electronically signed: Arlin Minion, MD 07/23/2024 / 1:09 PM  *Some images could not be shown.

## 2024-07-25 DIAGNOSIS — R001 Bradycardia, unspecified: Secondary | ICD-10-CM | POA: Diagnosis not present

## 2024-07-28 ENCOUNTER — Telehealth: Payer: Self-pay | Admitting: Cardiology

## 2024-07-28 DIAGNOSIS — R001 Bradycardia, unspecified: Secondary | ICD-10-CM | POA: Diagnosis not present

## 2024-07-28 NOTE — Telephone Encounter (Signed)
 Shannon Kidd with [employer] calling with abnormal Zio patch results for pt. Please advise.  Reference #[id number]  Representative hung up.

## 2024-07-28 NOTE — Telephone Encounter (Signed)
 See monitor results. The monitor has been reviewed.

## 2024-07-29 DIAGNOSIS — J90 Pleural effusion, not elsewhere classified: Secondary | ICD-10-CM | POA: Diagnosis not present

## 2024-07-29 DIAGNOSIS — R0602 Shortness of breath: Secondary | ICD-10-CM | POA: Diagnosis not present

## 2024-07-29 DIAGNOSIS — R911 Solitary pulmonary nodule: Secondary | ICD-10-CM | POA: Diagnosis not present

## 2024-07-29 DIAGNOSIS — G4730 Sleep apnea, unspecified: Secondary | ICD-10-CM | POA: Diagnosis not present

## 2024-07-31 DIAGNOSIS — I5032 Chronic diastolic (congestive) heart failure: Secondary | ICD-10-CM | POA: Diagnosis not present

## 2024-07-31 DIAGNOSIS — N179 Acute kidney failure, unspecified: Secondary | ICD-10-CM | POA: Diagnosis not present

## 2024-07-31 DIAGNOSIS — N906 Unspecified hypertrophy of vulva: Secondary | ICD-10-CM | POA: Diagnosis not present

## 2024-07-31 DIAGNOSIS — G629 Polyneuropathy, unspecified: Secondary | ICD-10-CM | POA: Diagnosis not present

## 2024-07-31 DIAGNOSIS — I10 Essential (primary) hypertension: Secondary | ICD-10-CM | POA: Diagnosis not present

## 2024-07-31 DIAGNOSIS — E1122 Type 2 diabetes mellitus with diabetic chronic kidney disease: Secondary | ICD-10-CM | POA: Diagnosis not present

## 2024-07-31 DIAGNOSIS — R251 Tremor, unspecified: Secondary | ICD-10-CM | POA: Diagnosis not present

## 2024-08-03 ENCOUNTER — Other Ambulatory Visit (HOSPITAL_COMMUNITY)

## 2024-08-03 ENCOUNTER — Encounter (HOSPITAL_COMMUNITY)

## 2024-08-07 ENCOUNTER — Encounter: Payer: Self-pay | Admitting: Podiatry

## 2024-08-07 ENCOUNTER — Ambulatory Visit: Admitting: Podiatry

## 2024-08-07 DIAGNOSIS — I10 Essential (primary) hypertension: Secondary | ICD-10-CM | POA: Diagnosis not present

## 2024-08-07 DIAGNOSIS — E7800 Pure hypercholesterolemia, unspecified: Secondary | ICD-10-CM | POA: Diagnosis not present

## 2024-08-07 DIAGNOSIS — N183 Chronic kidney disease, stage 3 unspecified: Secondary | ICD-10-CM | POA: Diagnosis not present

## 2024-08-07 DIAGNOSIS — E119 Type 2 diabetes mellitus without complications: Secondary | ICD-10-CM | POA: Diagnosis not present

## 2024-08-07 DIAGNOSIS — I350 Nonrheumatic aortic (valve) stenosis: Secondary | ICD-10-CM | POA: Diagnosis not present

## 2024-08-07 DIAGNOSIS — B351 Tinea unguium: Secondary | ICD-10-CM | POA: Diagnosis not present

## 2024-08-07 DIAGNOSIS — M79609 Pain in unspecified limb: Secondary | ICD-10-CM

## 2024-08-07 DIAGNOSIS — N1831 Chronic kidney disease, stage 3a: Secondary | ICD-10-CM | POA: Diagnosis not present

## 2024-08-07 NOTE — Progress Notes (Signed)
  Subjective:  Patient ID: Shannon Kidd, female    DOB: 1946-07-31,   MRN: [id number]  Chief Complaint  Patient presents with   Diabetes    Cut my toenails and check my toes to make sure they're okay. Saw Dr. Mercie - 03/19/2024; A1c - 7.5    78 y.o. female presents for concern of thickened elongated and painful nails that are difficult to trim. Requesting to have them trimmed today. Relates burning and tingling in their feet. Patient is diabetic and last A1c was  Lab Results  Component Value Date   HGBA1C 7.4 (H) 03/16/2024   .   PCP:  Shannon Pfeiffer, Shannon Kidd    . Denies any other pedal complaints. Denies n/v/f/c.   Past Medical History:  Diagnosis Date   Anxiety    Cervical myelopathy (HCC)    s/p cervical fusion @ [facility]   Chronic diastolic congestive heart failure (HCC)    CKD (chronic kidney disease), stage III (HCC)    Diabetes mellitus (HCC)    Glaucoma    HTN (hypertension)    Hyperlipemia    Morbid obesity (HCC)    Rheumatic fever    S/P TAVR (transcatheter aortic valve replacement) 07/29/2018   26 mm Edwards Sapien 3 transcatheter heart valve placed via percutaneous right transfemoral approach    Severe aortic stenosis     Objective:  Physical Exam: Vascular: DP/PT pulses 2/4 bilateral. CFT <3 seconds. Absent hair growth on digits. Edema noted to bilateral lower extremities. Xerosis noted bilaterally.  Skin. No lacerations or abrasions bilateral feet. Nails 1-5 bilateral  are thickened discolored and elongated with subungual debris.  Musculoskeletal: MMT 5/5 bilateral lower extremities in DF, PF, Inversion and Eversion. Deceased ROM in DF of ankle joint.  Neurological: Sensation intact to light touch. Protective sensation diminished bilateral.    Assessment:   1. Pain due to onychomycosis of nail   2. Type 2 diabetes mellitus without complication, without long-term current use of insulin  (HCC)       Plan:  Patient was evaluated and treated and all  questions answered. -Discussed and educated patient on diabetic foot care, especially with  regards to the vascular, neurological and musculoskeletal systems.  -Stressed the importance of good glycemic control and the detriment of not  controlling glucose levels in relation to the foot. -Discussed supportive shoes at all times and checking feet regularly.  -Mechanically debrided all nails 1-5 bilateral using sterile nail nipper and filed with dremel without incident  -Answered all patient questions -Patient to return  in 3 months for at risk foot care -Patient advised to call the office if any problems or questions arise in the meantime.   Asberry Failing, DPM

## 2024-08-13 DIAGNOSIS — R911 Solitary pulmonary nodule: Secondary | ICD-10-CM | POA: Diagnosis not present

## 2024-08-13 DIAGNOSIS — Z952 Presence of prosthetic heart valve: Secondary | ICD-10-CM | POA: Diagnosis not present

## 2024-08-13 DIAGNOSIS — R0602 Shortness of breath: Secondary | ICD-10-CM | POA: Diagnosis not present

## 2024-08-13 DIAGNOSIS — I5030 Unspecified diastolic (congestive) heart failure: Secondary | ICD-10-CM | POA: Diagnosis not present

## 2024-08-17 ENCOUNTER — Other Ambulatory Visit

## 2024-08-17 DIAGNOSIS — E1165 Type 2 diabetes mellitus with hyperglycemia: Secondary | ICD-10-CM | POA: Diagnosis not present

## 2024-08-17 DIAGNOSIS — N183 Chronic kidney disease, stage 3 unspecified: Secondary | ICD-10-CM | POA: Diagnosis not present

## 2024-08-17 DIAGNOSIS — N1831 Chronic kidney disease, stage 3a: Secondary | ICD-10-CM | POA: Diagnosis not present

## 2024-08-17 DIAGNOSIS — I10 Essential (primary) hypertension: Secondary | ICD-10-CM | POA: Diagnosis not present

## 2024-08-17 DIAGNOSIS — I350 Nonrheumatic aortic (valve) stenosis: Secondary | ICD-10-CM | POA: Diagnosis not present

## 2024-08-18 ENCOUNTER — Ambulatory Visit: Payer: Self-pay | Admitting: Endocrinology

## 2024-08-18 LAB — BASIC METABOLIC PANEL WITH GFR
BUN/Creatinine Ratio: 23 (calc) — ABNORMAL HIGH (ref 6–22)
BUN: 33 mg/dL — ABNORMAL HIGH (ref 7–25)
CO2: 28 mmol/L (ref 20–32)
Calcium: 9.1 mg/dL (ref 8.6–10.4)
Chloride: 106 mmol/L (ref 98–110)
Creat: 1.46 mg/dL — ABNORMAL HIGH (ref 0.60–1.00)
Glucose, Bld: 166 mg/dL — ABNORMAL HIGH (ref 65–99)
Potassium: 4.6 mmol/L (ref 3.5–5.3)
Sodium: 140 mmol/L (ref 135–146)
eGFR: 37 mL/min/{1.73_m2} — ABNORMAL LOW

## 2024-08-18 LAB — HEMOGLOBIN A1C
Hgb A1c MFr Bld: 7.3 % — ABNORMAL HIGH
Mean Plasma Glucose: 163 mg/dL
eAG (mmol/L): 9 mmol/L

## 2024-08-18 LAB — MICROALBUMIN / CREATININE URINE RATIO
Creatinine, Urine: 61 mg/dL (ref 20–275)
Microalb Creat Ratio: 380 mg/g{creat} — ABNORMAL HIGH
Microalb, Ur: 23.2 mg/dL

## 2024-08-20 ENCOUNTER — Encounter: Payer: Self-pay | Admitting: Endocrinology

## 2024-08-20 ENCOUNTER — Other Ambulatory Visit: Payer: Self-pay

## 2024-08-20 ENCOUNTER — Ambulatory Visit: Admitting: Endocrinology

## 2024-08-20 ENCOUNTER — Other Ambulatory Visit (HOSPITAL_COMMUNITY): Payer: Self-pay

## 2024-08-20 VITALS — BP 106/60 | HR 64 | Resp 16 | Ht 60.0 in | Wt 166.6 lb

## 2024-08-20 DIAGNOSIS — Z7984 Long term (current) use of oral hypoglycemic drugs: Secondary | ICD-10-CM

## 2024-08-20 DIAGNOSIS — E1165 Type 2 diabetes mellitus with hyperglycemia: Secondary | ICD-10-CM | POA: Diagnosis not present

## 2024-08-20 MED ORDER — GLIPIZIDE 5 MG PO TABS
5.0000 mg | ORAL_TABLET | Freq: Every day | ORAL | 3 refills | Status: AC
  Filled 2024-08-20: qty 90, 90d supply, fill #0

## 2024-08-20 NOTE — Progress Notes (Signed)
 Outpatient Endocrinology Note Areen Trautner, MD  08/20/24  Patient's Name: Shannon Kidd    DOB: [birth year]    MRN: [id number]                                                    REASON OF VISIT: Follow up of type 2 diabetes mellitus  PCP: Katina Pfeiffer, PA-C  HISTORY OF PRESENT ILLNESS:   Shannon Kidd is a 78 y.o. old female with past medical history listed below, is here for follow up for type 2 diabetes mellitus.    Pertinent Diabetes History: Patient was previously seen by Dr. Von and was last time seen in July 2024.  Patient was diagnosed with type 2 diabetes mellitus in 2000.  Patient has uncontrolled type 2 diabetes mellitus.  Chronic Diabetes Complications : Retinopathy: no. Last ophthalmology exam was done on annually, following with ophthalmology regularly.  Nephropathy: CKD IIIb, on ACE/ARB /losartan  following with nephrology. Peripheral neuropathy: no Coronary artery disease: no Stroke: yes, reports cranial nerve VI palsy.  Relevant comorbidities and cardiovascular risk factors: Obesity: yes Body mass index is 32.54 kg/m.  Hypertension: Yes  Hyperlipidemia : Yes, on statin   Current / Home Diabetic regimen includes:  Metformin  extended release 500 mg 1 time a day.  No longer taking it from recent hospitalization.  Glipizide  5 mg daily.  Prior diabetic medications: Diarrhea with higher dose of metformin . Jardiance  in the past, caused nausea, vomiting and weakness.  Januvia in the past.  Pioglitazone in the past caused swelling. Farxiga  stopped due to high cost. Glipizide  extended release 2.5 mg daily.  Glycemic data:   Glucometer One Touch Verio flex, download from October 30 to August 20, 2024 reviewed.  Average blood sugar  121.  She has been checking blood sugar at different times of the day.  Fasting blood sugar 146, 148, 162, 117, 109, 155.  Blood sugar in the afternoon 76, 100, 101, 126.  1 episode of hypoglycemia 56 on November 7 in the  evening.  Hypoglycemia: Patient has minor hypoglycemic episodes. Patient has hypoglycemia awareness.  Factors modifying glucose control: 1.  Diabetic diet assessment: 3 meals a day.  2.  Staying active or exercising: No formal exercise.  3.  Medication compliance: compliant all of the time.  Interval history  Patient was hospitalized in mid of October at [facility] due to respiratory failure due to COVID-19 infection and pneumonia.  Metformin  was stopped due to AKI on CKD with worsening renal function.  And glipizide  was increased from 2.5 to 5 mg daily.  Glucometer data as reviewed above.  Mostly acceptable blood sugar.  Recent hemoglobin A1c 7.3%.  No other complaints today.  REVIEW OF SYSTEMS As per history of present illness.   PAST MEDICAL HISTORY: Past Medical History:  Diagnosis Date   Anxiety    Cervical myelopathy (HCC)    s/p cervical fusion @ [facility]   Chronic diastolic congestive heart failure (HCC)    CKD (chronic kidney disease), stage III (HCC)    Diabetes mellitus (HCC)    Glaucoma    HTN (hypertension)    Hyperlipemia    Morbid obesity (HCC)    Rheumatic fever    S/P TAVR (transcatheter aortic valve replacement) 07/29/2018   26 mm Edwards Sapien 3 transcatheter heart valve placed via percutaneous right  transfemoral approach    Severe aortic stenosis     PAST SURGICAL HISTORY: Past Surgical History:  Procedure Laterality Date   APPENDECTOMY     CARDIAC CATHETERIZATION     EYE SURGERY Bilateral    cataract extraction bilateral with lens   INTRAOPERATIVE TRANSTHORACIC ECHOCARDIOGRAM  07/29/2018   Procedure: INTRAOPERATIVE TRANSTHORACIC ECHOCARDIOGRAM;  Surgeon: Wonda Sharper, MD;  Location: [facility] OR;  Service: Open Heart Surgery;;   NECK SURGERY     RIGHT/LEFT HEART CATH AND CORONARY ANGIOGRAPHY N/A 06/04/2018   Procedure: RIGHT/LEFT HEART CATH AND CORONARY ANGIOGRAPHY;  Surgeon: Wonda Sharper, MD;  Location: [facility] INVASIVE CV LAB;   Service: Cardiovascular;  Laterality: N/A;   TONSILLECTOMY     TRANSCATHETER AORTIC VALVE REPLACEMENT, TRANSFEMORAL N/A 07/29/2018   Procedure: TRANSCATHETER AORTIC VALVE REPLACEMENT, TRANSFEMORAL;  Surgeon: Wonda Sharper, MD;  Location: [facility] OR;  Service: Open Heart Surgery;  Laterality: N/A;   TUBAL LIGATION     VAGINAL HYSTERECTOMY      ALLERGIES: Allergies  Allergen Reactions   Sitagliptin Nausea And Vomiting   Empagliflozin  Other (See Comments) and Nausea And Vomiting    N/V, diarrhea, weakness   Pioglitazone Swelling    FAMILY HISTORY:  Family History  Problem Relation Age of Onset   Diabetes Mother    Cancer Mother        Colon   Diabetes Sister    Diabetes Paternal Grandmother    Breast cancer Maternal Grandmother 36    SOCIAL HISTORY: Social History   Socioeconomic History   Marital status: Widowed    Spouse name: Not on file   Number of children: 2   Years of education: Not on file   Highest education level: Not on file  Occupational History   Not on file  Tobacco Use   Smoking status: Never   Smokeless tobacco: Never  Vaping Use   Vaping status: Never Used  Substance and Sexual Activity   Alcohol use: No    Alcohol/week: 0.0 standard drinks of alcohol   Drug use: Never   Sexual activity: Not on file  Other Topics Concern   Not on file  Social History Narrative   Not on file   Social Drivers of Health   Financial Resource Strain: Low Risk  (10/30/2023)   Received from [facility]   Overall Financial Resource Strain (CARDIA)    Difficulty of Paying Living Expenses: Not very hard  Food Insecurity: No Food Insecurity (07/21/2024)   Received from [facility]   Hunger Vital Sign    Within the past 12 months, you worried that your food would run out before you got the money to buy more.: Never true    Within the past 12 months, the food you bought just didn't last and you didn't have money to get more.: Never true  Transportation Needs: No  Transportation Needs (07/21/2024)   Received from [facility] - Transportation    In the past 12 months, has lack of transportation kept you from medical appointments or from getting medications?: No    In the past 12 months, has lack of transportation kept you from meetings, work, or from getting things needed for daily living?: No  Physical Activity: Insufficiently Active (10/30/2023)   Received from [facility]   Exercise Vital Sign    On average, how many days per week do you engage in moderate to strenuous exercise (like a brisk walk)?: 2 days    On average, how many minutes do  you engage in exercise at this level?: 30 min  Stress: No Stress Concern Present (07/21/2024)   Received from [facility] - Occupational Stress Questionnaire    Do you feel stress - tense, restless, nervous, or anxious, or unable to sleep at night because your mind is troubled all the time - these days?: Not at all  Social Connections: Somewhat Isolated (10/30/2023)   Received from [facility]   Social Network    How would you rate your social network (family, work, friends)?: Restricted participation with some degree of social isolation    MEDICATIONS:  Current Outpatient Medications  Medication Sig Dispense Refill   albuterol  (VENTOLIN  HFA) 108 (90 Base) MCG/ACT inhaler Inhale 2 puffs into the lungs every 6 (six) hours as needed for wheezing or shortness of breath. 6.7 g 0   amLODipine  (NORVASC ) 5 MG tablet Take 1 tablet (5 mg total) by mouth every morning. 90 tablet 1   Ascorbic Acid (VITAMIN C) 1000 MG tablet Take 1,000 mg by mouth daily.     aspirin  81 MG tablet Take 81 mg by mouth daily.     Blood Glucose Monitoring Suppl (ONETOUCH VERIO IQ SYSTEM) w/Device KIT USE TO CHECK BLOOD SUGAR ONCE DAILY Dx code E11.9 1 kit 2   Cholecalciferol (VITAMIN D) 2000 UNITS tablet Take 2,000 Units by mouth daily.     Clobetasol  Propionate 0.05 % shampoo Apply 1  application leave in for 3-53minutes, then rinse. Do twice a week as directed. 118 mL 0   furosemide  (LASIX ) 20 MG tablet Take 1 tablet (20 mg total) by mouth daily. 90 tablet 3   glucose blood (ONETOUCH VERIO) test strip Use as instructed to check blood sugar once a day. 200 each 1   losartan  (COZAAR ) 50 MG tablet Take 1 tablet (50 mg total) by mouth daily. 90 tablet 0   ondansetron  (ZOFRAN -ODT) 4 MG disintegrating tablet Dissolve 1 tablet (4 mg total) by mouth every 8 (eight) hours as needed. 30 tablet 0   ONETOUCH DELICA LANCETS 33G MISC USE TO CHECK BLOOD SUGAR ONCE DAILY Dx code E11.9 100 each 2   pantoprazole  (PROTONIX ) 40 MG tablet Take 1 tablet (40 mg total) by mouth 1/2 - 1 hour before a meal 2 (two) times daily. 180 tablet 0   pramipexole  (MIRAPEX ) 0.25 MG tablet Take 1 tablet (0.25 mg total) by mouth daily. 90 tablet 0   rosuvastatin  (CRESTOR ) 20 MG tablet Take 1 tablet (20 mg total) by mouth at bedtime. 90 tablet 1   sertraline  (ZOLOFT ) 50 MG tablet Take 1.5 tablets (75 mg total) by mouth daily. 135 tablet 2   vitamin B-12 (CYANOCOBALAMIN ) 1000 MCG tablet Take 1,000 mcg by mouth daily.     glipiZIDE  (GLUCOTROL ) 5 MG tablet Take 1 tablet (5 mg total) by mouth daily. 90 tablet 3   No current facility-administered medications for this visit.    PHYSICAL EXAM: Vitals:   08/20/24 0912  BP: 106/60  Pulse: 64  Resp: 16  SpO2: 98%  Weight: 166 lb 9.6 oz (75.6 kg)  Height: 5' (1.524 m)   Body mass index is 32.54 kg/m.  Wt Readings from Last 3 Encounters:  08/20/24 166 lb 9.6 oz (75.6 kg)  07/07/24 184 lb 3.2 oz (83.6 kg)  03/19/24 168 lb 12.8 oz (76.6 kg)    General: Well developed, well nourished female in no apparent distress.  HEENT: AT/[place], no external lesions.  Eyes: Conjunctiva clear and no icterus.  Neck: Neck supple  Lungs: Respirations not labored Neurologic: Alert, oriented, normal speech Extremities / Skin: Dry.  Psychiatric: Does not appear depressed or  anxious  Diabetic Foot Exam - Simple   No data filed    LABS Reviewed Lab Results  Component Value Date   HGBA1C 7.3 (H) 08/17/2024   HGBA1C 7.4 (H) 03/16/2024   HGBA1C 5.8 (H) 12/09/2023   Lab Results  Component Value Date   FRUCTOSAMINE 292 (H) 08/07/2023   FRUCTOSAMINE 255 07/27/2019   FRUCTOSAMINE 258 08/25/2014   Lab Results  Component Value Date   CHOL 73 10/25/2022   HDL 35.20 (L) 10/25/2022   LDLCALC 19 10/25/2022   TRIG 90.0 10/25/2022   CHOLHDL 2 10/25/2022   Lab Results  Component Value Date   MICRALBCREAT 380 (H) 08/17/2024   Lab Results  Component Value Date   CREATININE 1.46 (H) 08/17/2024   Lab Results  Component Value Date   GFR 38.34 (L) 08/07/2023    ASSESSMENT / PLAN  1. Uncontrolled type 2 diabetes mellitus with hyperglycemia, without long-term current use of insulin  (HCC)     Diabetes Mellitus type 2, complicated by diabetic nephropathy/CKD. - Diabetic status / severity: Uncontrolled.  Lab Results  Component Value Date   HGBA1C 7.3 (H) 08/17/2024    - Hemoglobin A1c goal : <7%  Was planned for SGLT2 inhibitor due to CKD and microalbuminuria, Farxiga  was high cost and she had side effects with Jardiance  in the past.  Mostly blood sugar acceptable blood sugar.  Limited options for oral diabetic medication due to cost and side effect as mentioned in HPI.  For now we will continue on glipizide  with caution.  - Medications: See below.  I) continue glipizide  5 mg daily.  - Home glucose testing: Check in the morning fasting daily and at bedtime few times a week.  - Discussed/ Gave Hypoglycemia treatment plan.  # Consult : not required at this time.   # Annual urine for microalbuminuria/ creatinine ratio,+ microalbuminuria currently, continue ACE/ARB /losartan , she has CKD 3B, following with nephrology. Last  Lab Results  Component Value Date   MICRALBCREAT 380 (H) 08/17/2024    # Foot check nightly.  # Annual dilated  diabetic eye exams.   - Diet: Make healthy diabetic food choices  2. Blood pressure  -  BP Readings from Last 1 Encounters:  08/20/24 106/60    - Control is in target.  - No change in current plans.  3. Lipid status / Hyperlipidemia - Last  Lab Results  Component Value Date   LDLCALC 19 10/25/2022   - Continue rosuvastatin  20 mg daily.  Managed by primary care provider.  Diagnoses and all orders for this visit:  Uncontrolled type 2 diabetes mellitus with hyperglycemia, without long-term current use of insulin  (HCC) -     glipiZIDE  (GLUCOTROL ) 5 MG tablet; Take 1 tablet (5 mg total) by mouth daily.    DISPOSITION Follow up in clinic in 3 months suggested.     All questions answered and patient verbalized understanding of the plan.  Mahrosh Donnell, MD [facility] Endocrinology [facility] [address] [place], [address] [place], [place] [address] Phone # [phone]  At least part of this note was generated using voice recognition software. Inadvertent word errors may have occurred, which were not recognized during the proofreading process.

## 2024-08-25 ENCOUNTER — Other Ambulatory Visit (HOSPITAL_COMMUNITY): Payer: Self-pay

## 2024-08-25 MED ORDER — LOSARTAN POTASSIUM 50 MG PO TABS
50.0000 mg | ORAL_TABLET | Freq: Every day | ORAL | 0 refills | Status: AC
  Filled 2024-08-25: qty 90, 90d supply, fill #0

## 2024-08-26 ENCOUNTER — Other Ambulatory Visit (HOSPITAL_COMMUNITY): Payer: Self-pay

## 2024-08-26 ENCOUNTER — Other Ambulatory Visit: Payer: Self-pay

## 2024-08-26 DIAGNOSIS — H5051 Esophoria: Secondary | ICD-10-CM | POA: Diagnosis not present

## 2024-08-26 DIAGNOSIS — H532 Diplopia: Secondary | ICD-10-CM | POA: Diagnosis not present

## 2024-08-26 DIAGNOSIS — E119 Type 2 diabetes mellitus without complications: Secondary | ICD-10-CM | POA: Diagnosis not present

## 2024-08-26 DIAGNOSIS — Z961 Presence of intraocular lens: Secondary | ICD-10-CM | POA: Diagnosis not present

## 2024-08-26 DIAGNOSIS — H4923 Sixth [abducent] nerve palsy, bilateral: Secondary | ICD-10-CM | POA: Diagnosis not present

## 2024-08-26 MED ORDER — ROSUVASTATIN CALCIUM 20 MG PO TABS
20.0000 mg | ORAL_TABLET | Freq: Every day | ORAL | 3 refills | Status: AC
  Filled 2024-08-26 – 2024-08-31 (×2): qty 30, 30d supply, fill #0
  Filled 2024-09-26: qty 90, 90d supply, fill #1

## 2024-08-26 MED ORDER — AMLODIPINE BESYLATE 5 MG PO TABS
5.0000 mg | ORAL_TABLET | Freq: Every day | ORAL | 5 refills | Status: AC
  Filled 2024-08-26 – 2024-08-31 (×2): qty 30, 30d supply, fill #0
  Filled 2024-09-26: qty 90, 90d supply, fill #1

## 2024-08-31 ENCOUNTER — Other Ambulatory Visit (HOSPITAL_COMMUNITY): Payer: Self-pay

## 2024-08-31 ENCOUNTER — Other Ambulatory Visit: Payer: Self-pay

## 2024-09-06 DIAGNOSIS — I10 Essential (primary) hypertension: Secondary | ICD-10-CM | POA: Diagnosis not present

## 2024-09-06 DIAGNOSIS — E7800 Pure hypercholesterolemia, unspecified: Secondary | ICD-10-CM | POA: Diagnosis not present

## 2024-09-06 DIAGNOSIS — I350 Nonrheumatic aortic (valve) stenosis: Secondary | ICD-10-CM | POA: Diagnosis not present

## 2024-09-06 DIAGNOSIS — N1831 Chronic kidney disease, stage 3a: Secondary | ICD-10-CM | POA: Diagnosis not present

## 2024-09-06 DIAGNOSIS — N183 Chronic kidney disease, stage 3 unspecified: Secondary | ICD-10-CM | POA: Diagnosis not present

## 2024-09-09 DIAGNOSIS — G4730 Sleep apnea, unspecified: Secondary | ICD-10-CM | POA: Diagnosis not present

## 2024-09-09 DIAGNOSIS — R911 Solitary pulmonary nodule: Secondary | ICD-10-CM | POA: Diagnosis not present

## 2024-09-09 DIAGNOSIS — I5030 Unspecified diastolic (congestive) heart failure: Secondary | ICD-10-CM | POA: Diagnosis not present

## 2024-09-15 ENCOUNTER — Other Ambulatory Visit (HOSPITAL_COMMUNITY): Payer: Self-pay

## 2024-09-15 ENCOUNTER — Ambulatory Visit: Admitting: Cardiology

## 2024-09-16 ENCOUNTER — Other Ambulatory Visit (HOSPITAL_COMMUNITY): Payer: Self-pay

## 2024-09-16 MED ORDER — SERTRALINE HCL 50 MG PO TABS
50.0000 mg | ORAL_TABLET | Freq: Every day | ORAL | 0 refills | Status: AC
  Filled 2024-09-16: qty 135, 90d supply, fill #0

## 2024-09-26 ENCOUNTER — Other Ambulatory Visit (HOSPITAL_COMMUNITY): Payer: Self-pay

## 2024-09-28 ENCOUNTER — Other Ambulatory Visit: Payer: Self-pay

## 2024-09-28 ENCOUNTER — Other Ambulatory Visit (HOSPITAL_COMMUNITY): Payer: Self-pay

## 2024-09-28 MED ORDER — PANTOPRAZOLE SODIUM 40 MG PO TBEC
40.0000 mg | DELAYED_RELEASE_TABLET | Freq: Two times a day (BID) | ORAL | 0 refills | Status: AC
  Filled 2024-09-28: qty 180, 90d supply, fill #0

## 2024-09-29 ENCOUNTER — Other Ambulatory Visit: Payer: Self-pay

## 2024-10-19 ENCOUNTER — Other Ambulatory Visit (HOSPITAL_COMMUNITY): Payer: Self-pay

## 2024-10-20 ENCOUNTER — Other Ambulatory Visit: Payer: Self-pay

## 2024-10-20 ENCOUNTER — Other Ambulatory Visit (HOSPITAL_COMMUNITY): Payer: Self-pay

## 2024-10-20 MED ORDER — PRAMIPEXOLE DIHYDROCHLORIDE 0.25 MG PO TABS
0.2500 mg | ORAL_TABLET | Freq: Every day | ORAL | 0 refills | Status: AC
  Filled 2024-10-20: qty 90, 90d supply, fill #0

## 2024-10-29 ENCOUNTER — Other Ambulatory Visit (HOSPITAL_COMMUNITY): Payer: Self-pay

## 2024-10-29 MED ORDER — AZITHROMYCIN 250 MG PO TABS
ORAL_TABLET | ORAL | 0 refills | Status: AC
  Filled 2024-10-29: qty 6, 5d supply, fill #0

## 2024-10-29 MED ORDER — PREDNISONE 10 MG PO TABS
ORAL_TABLET | ORAL | 0 refills | Status: AC
  Filled 2024-10-29: qty 12, 6d supply, fill #0

## 2024-11-06 ENCOUNTER — Ambulatory Visit (INDEPENDENT_AMBULATORY_CARE_PROVIDER_SITE_OTHER): Admitting: Podiatry

## 2024-11-06 ENCOUNTER — Encounter: Payer: Self-pay | Admitting: Podiatry

## 2024-11-06 DIAGNOSIS — M79675 Pain in left toe(s): Secondary | ICD-10-CM | POA: Diagnosis not present

## 2024-11-06 DIAGNOSIS — E119 Type 2 diabetes mellitus without complications: Secondary | ICD-10-CM

## 2024-11-06 DIAGNOSIS — B351 Tinea unguium: Secondary | ICD-10-CM

## 2024-11-06 DIAGNOSIS — M79674 Pain in right toe(s): Secondary | ICD-10-CM

## 2024-11-06 NOTE — Progress Notes (Signed)
"  °  Subjective:  Patient ID: Shannon Kidd, female    DOB: Jul 19, 1946,   MRN: [id number]  Chief Complaint  Patient presents with   Diabetes    Cut my toenails.  My big ones are big, they are flaring out.  Saw Dr. Mercie - 08/20/2024; A1c - 7.3    79 y.o. female presents for concern of thickened elongated and painful nails that are difficult to trim. Requesting to have them trimmed today. Relates burning and tingling in their feet. Patient is diabetic and last A1c was  Lab Results  Component Value Date   HGBA1C 7.3 (H) 08/17/2024   .   PCP:  Katina Pfeiffer, PA-C    . Denies any other pedal complaints. Denies n/v/f/c.   Past Medical History:  Diagnosis Date   Anxiety    Cervical myelopathy (HCC)    s/p cervical fusion @ [facility]   Chronic diastolic congestive heart failure (HCC)    CKD (chronic kidney disease), stage III (HCC)    Diabetes mellitus (HCC)    Glaucoma    HTN (hypertension)    Hyperlipemia    Morbid obesity (HCC)    Rheumatic fever    S/P TAVR (transcatheter aortic valve replacement) 07/29/2018   26 mm Edwards Sapien 3 transcatheter heart valve placed via percutaneous right transfemoral approach    Severe aortic stenosis     Objective:  Physical Exam: Vascular: DP/PT pulses 2/4 bilateral. CFT <3 seconds. Absent hair growth on digits. Edema noted to bilateral lower extremities. Xerosis noted bilaterally.  Skin. No lacerations or abrasions bilateral feet. Nails 1-5 bilateral  are thickened discolored and elongated with subungual debris.  Musculoskeletal: MMT 5/5 bilateral lower extremities in DF, PF, Inversion and Eversion. Deceased ROM in DF of ankle joint.  Neurological: Sensation intact to light touch. Protective sensation diminished bilateral.    Assessment:   1. Pain due to onychomycosis of nail   2. Type 2 diabetes mellitus without complication, without long-term current use of insulin  (HCC)       Plan:  Patient was evaluated and treated and all  questions answered. -Discussed and educated patient on diabetic foot care, especially with  regards to the vascular, neurological and musculoskeletal systems.  -Stressed the importance of good glycemic control and the detriment of not  controlling glucose levels in relation to the foot. -Discussed supportive shoes at all times and checking feet regularly.  -Mechanically debrided all nails 1-5 bilateral using sterile nail nipper and filed with dremel without incident  -Answered all patient questions -Patient to return  in 3 months for at risk foot care -Patient advised to call the office if any problems or questions arise in the meantime.   Asberry Failing, DPM    "

## 2024-11-24 ENCOUNTER — Ambulatory Visit: Admitting: Endocrinology

## 2025-02-04 ENCOUNTER — Ambulatory Visit: Admitting: Podiatry

## 2025-02-15 ENCOUNTER — Ambulatory Visit: Admitting: Endocrinology
